# Patient Record
Sex: Female | Born: 1965 | Marital: Married | Smoking: Current every day smoker
Health system: Southern US, Community
[De-identification: ages and names within clinical notes are randomized; demographics above are authoritative.]

## PROBLEM LIST (undated history)

## (undated) DIAGNOSIS — F419 Anxiety disorder, unspecified: Secondary | ICD-10-CM

## (undated) DIAGNOSIS — J45909 Unspecified asthma, uncomplicated: Secondary | ICD-10-CM

## (undated) DIAGNOSIS — M1990 Unspecified osteoarthritis, unspecified site: Secondary | ICD-10-CM

## (undated) DIAGNOSIS — M4802 Spinal stenosis, cervical region: Secondary | ICD-10-CM

## (undated) DIAGNOSIS — D171 Benign lipomatous neoplasm of skin and subcutaneous tissue of trunk: Secondary | ICD-10-CM

## (undated) DIAGNOSIS — T7840XA Allergy, unspecified, initial encounter: Secondary | ICD-10-CM

## (undated) HISTORY — DX: Unspecified osteoarthritis, unspecified site: M19.90

## (undated) HISTORY — PX: BREAST SURGERY: SHX581

## (undated) HISTORY — DX: Allergy, unspecified, initial encounter: T78.40XA

## (undated) HISTORY — DX: Anxiety disorder, unspecified: F41.9

## (undated) HISTORY — PX: BUNIONECTOMY: SHX129

## (undated) HISTORY — PX: GANGLION CYST EXCISION: SHX1691

## (undated) HISTORY — PX: DILATION AND CURETTAGE OF UTERUS: SHX78

## (undated) HISTORY — DX: Spinal stenosis, cervical region: M48.02

---

## 2016-09-14 ENCOUNTER — Ambulatory Visit (INDEPENDENT_AMBULATORY_CARE_PROVIDER_SITE_OTHER): Payer: 59 | Admitting: Internal Medicine

## 2016-09-14 ENCOUNTER — Encounter: Payer: Self-pay | Admitting: Internal Medicine

## 2016-09-14 VITALS — BP 118/80 | HR 93 | Temp 98.2°F | Ht 59.0 in | Wt 116.0 lb

## 2016-09-14 DIAGNOSIS — J302 Other seasonal allergic rhinitis: Secondary | ICD-10-CM | POA: Insufficient documentation

## 2016-09-14 DIAGNOSIS — M4722 Other spondylosis with radiculopathy, cervical region: Secondary | ICD-10-CM

## 2016-09-14 DIAGNOSIS — J301 Allergic rhinitis due to pollen: Secondary | ICD-10-CM

## 2016-09-14 NOTE — Progress Notes (Signed)
HPI  Pt presents to the clinic today to establish care and for management of the conditions listed below. She has not had a PCP in many years.  Seasonal Allergies: Worse when it rains. She takes Bendaryl as needed with good relief  Arthritis/Spinal Stenosis of Cervical Region: She recently had a MRI of her cervical spine in [place]. She has had multiple shots with some relief. She takes Percocet and Baclofen as needed with good relief.   Flu: 08/2016 Tetanus: unsure Pap Smear: > 5 years ago Mammogram: ? 2014 in [place] Colon Screening: ? 2014 in [place] Vision Screening: annually Dentist: biannually  Past Medical History:  Diagnosis Date  . Allergy   . Arthritis   . Spinal stenosis of cervical region     Current Outpatient Prescriptions  Medication Sig Dispense Refill  . baclofen (LIORESAL) 10 MG tablet Take 10 mg by mouth 2 (two) times daily.    . diphenhydrAMINE (BENADRYL) 25 MG tablet Take 25 mg by mouth every 6 (six) hours as needed.    Marland Kitchen oxyCODONE-acetaminophen (PERCOCET) 10-325 MG tablet Take 1 tablet by mouth every 12 (twelve) hours.     No current facility-administered medications for this visit.     No Known Allergies  Family History  Problem Relation Age of Onset  . Alcohol abuse Mother   . Cervical cancer Mother   . Hodgkin's lymphoma Sister     Social History   Social History  . Marital status: Married    Spouse name: N/A  . Number of children: N/A  . Years of education: N/A   Occupational History  . Not on file.   Social History Main Topics  . Smoking status: Current Every Day Smoker    Packs/day: 0.50    Types: Cigarettes  . Smokeless tobacco: Never Used  . Alcohol use Yes     Comment: occasional  . Drug use: No  . Sexual activity: Not on file   Other Topics Concern  . Not on file   Social History Narrative  . No narrative on file    ROS:  Constitutional: Denies fever, malaise, fatigue, headache or abrupt weight changes.  HEENT: Denies eye pain,  eye redness, ear pain, ringing in the ears, wax buildup, runny nose, nasal congestion, bloody nose, or sore throat. Respiratory: Denies difficulty breathing, shortness of breath, cough or sputum production.   Cardiovascular: Denies chest pain, chest tightness, palpitations or swelling in the hands or feet.  Musculoskeletal: Pt reports neck pain. Denies decrease in range of motion, difficulty with gait, or joint swelling.  Skin: Denies redness, rashes, lesions or ulcercations.  Neurological: Denies dizziness, difficulty with memory, difficulty with speech or problems with balance and coordination.  Psych: Denies anxiety, depression, SI/HI.  No other specific complaints in a complete review of systems (except as listed in HPI above).  PE:  BP 118/80   Pulse 93   Temp 98.2 F (36.8 C) (Oral)   Ht 4\' 11"  (1.499 m)   Wt 116 lb (52.6 kg)   LMP 02/12/2011   SpO2 96%   BMI 23.43 kg/m   Wt Readings from Last 3 Encounters:  09/14/16 116 lb (52.6 kg)    General: Appears her stated age, in NAD. Cardiovascular: Normal rate and rhythm. S1,S2 noted.  No murmur, rubs or gallops noted.  Pulmonary/Chest: Normal effort and positive vesicular breath sounds. No respiratory distress. No wheezes, rales or ronchi noted.  Musculoskeletal: No difficulty with gait.  Neurological: Alert and oriented.  Psychiatric: Mood  and affect normal. Behavior is normal. Judgment and thought content normal.     Assessment and Plan:

## 2016-09-14 NOTE — Assessment & Plan Note (Signed)
Referral to pain management We will try to get records from pain management and MRI from [place] I will not be filling her Percocet long term

## 2016-09-14 NOTE — Patient Instructions (Signed)
Cervical Radiculopathy  Cervical radiculopathy means that a nerve in the neck is pinched or bruised. This can cause pain or loss of feeling (numbness) that runs from your neck to your arm and fingers.  Follow these instructions at home:  Managing pain  ? Take over-the-counter and prescription medicines only as told by your doctor.  ? If directed, put ice on the injured or painful area.  ? Put ice in a plastic bag.  ? Place a towel between your skin and the bag.  ? Leave the ice on for 20 minutes, 2?3 times per day.  ? If ice does not help, you can try using heat. Take a warm shower or warm bath, or use a heat pack as told by your doctor.  ? You may try a gentle neck and shoulder massage.  Activity  ? Rest as needed. Follow instructions from your doctor about any activities to avoid.  ? Do exercises as told by your doctor or physical therapist.  General instructions  ? If you were given a soft collar, wear it as told by your doctor.  ? Use a flat pillow when you sleep.  ? Keep all follow-up visits as told by your doctor. This is important.  Contact a doctor if:  ? Your condition does not improve with treatment.  Get help right away if:  ? Your pain gets worse and is not controlled with medicine.  ? You lose feeling or feel weak in your hand, arm, face, or leg.  ? You have a fever.  ? You have a stiff neck.  ? You cannot control when you poop or pee (have incontinence).  ? You have trouble with walking, balance, or talking.  This information is not intended to replace advice given to you by your health care provider. Make sure you discuss any questions you have with your health care provider.  Document Released: 12/15/2010 Document Revised: 06/03/2015 Document Reviewed: 02/19/2014  [employer]vier Interactive Patient Education ? 2018 [employer]

## 2016-09-14 NOTE — Assessment & Plan Note (Signed)
Continue Benadryl prn

## 2016-09-20 ENCOUNTER — Other Ambulatory Visit: Payer: Self-pay

## 2016-09-20 NOTE — Telephone Encounter (Signed)
Pt left v/m requesting rx oxycodone apap. Call when ready for pick up. Pt seen 09/14/16 and in note has R Baity NP will not be filling oxycodone long term.Please advise.

## 2016-09-21 ENCOUNTER — Encounter: Payer: Self-pay | Admitting: Internal Medicine

## 2016-09-21 MED ORDER — OXYCODONE-ACETAMINOPHEN 10-325 MG PO TABS
1.0000 | ORAL_TABLET | Freq: Two times a day (BID) | ORAL | 0 refills | Status: DC
Start: 2016-09-21 — End: 2016-12-07

## 2016-09-21 NOTE — Telephone Encounter (Signed)
We just received her previous Pain records and I have given you a copy and I have faxed a copy to [facility]. They have to review her records and then if accepted they will call her directly. We send the Referral electronically to them. You can pull her up in Epic and see if an appointment has been set up for her in the future but it will probably take at least a couple of weeks.

## 2016-09-21 NOTE — Telephone Encounter (Signed)
Pt is aware as instructed and Rx left in front office for pick up

## 2016-09-21 NOTE — Telephone Encounter (Signed)
When is her pain management appt?

## 2016-09-21 NOTE — Telephone Encounter (Signed)
RX printed and signed and placed in MYD box. She needs to let me know as soon as she has an appt with pain management.

## 2016-10-12 ENCOUNTER — Ambulatory Visit
Payer: 59 | Attending: Student in an Organized Health Care Education/Training Program | Admitting: Student in an Organized Health Care Education/Training Program

## 2016-10-17 ENCOUNTER — Telehealth: Payer: Self-pay | Admitting: Internal Medicine

## 2016-10-17 ENCOUNTER — Encounter: Payer: Self-pay | Admitting: Student in an Organized Health Care Education/Training Program

## 2016-10-17 ENCOUNTER — Ambulatory Visit
Payer: 59 | Attending: Student in an Organized Health Care Education/Training Program | Admitting: Student in an Organized Health Care Education/Training Program

## 2016-10-17 VITALS — BP 150/82 | HR 75 | Temp 98.3°F | Resp 16 | Ht 59.0 in | Wt 116.0 lb

## 2016-10-17 DIAGNOSIS — S139XXD Sprain of joints and ligaments of unspecified parts of neck, subsequent encounter: Secondary | ICD-10-CM

## 2016-10-17 DIAGNOSIS — G894 Chronic pain syndrome: Secondary | ICD-10-CM | POA: Diagnosis not present

## 2016-10-17 DIAGNOSIS — F17210 Nicotine dependence, cigarettes, uncomplicated: Secondary | ICD-10-CM | POA: Diagnosis not present

## 2016-10-17 DIAGNOSIS — M47812 Spondylosis without myelopathy or radiculopathy, cervical region: Secondary | ICD-10-CM | POA: Diagnosis not present

## 2016-10-17 DIAGNOSIS — M4722 Other spondylosis with radiculopathy, cervical region: Secondary | ICD-10-CM

## 2016-10-17 DIAGNOSIS — X58XXXA Exposure to other specified factors, initial encounter: Secondary | ICD-10-CM | POA: Diagnosis not present

## 2016-10-17 DIAGNOSIS — G5603 Carpal tunnel syndrome, bilateral upper limbs: Secondary | ICD-10-CM | POA: Insufficient documentation

## 2016-10-17 DIAGNOSIS — M542 Cervicalgia: Secondary | ICD-10-CM | POA: Diagnosis not present

## 2016-10-17 DIAGNOSIS — S139XXA Sprain of joints and ligaments of unspecified parts of neck, initial encounter: Secondary | ICD-10-CM | POA: Diagnosis not present

## 2016-10-17 MED ORDER — DULOXETINE HCL 30 MG PO CPEP
30.0000 mg | ORAL_CAPSULE | Freq: Every day | ORAL | 1 refills | Status: DC
Start: 2016-10-17 — End: 2016-12-07

## 2016-10-17 MED ORDER — LIDOCAINE 5 % EX OINT: 1.0000 "application " | TOPICAL_OINTMENT | Freq: Four times a day (QID) | CUTANEOUS | 2 refills | Status: DC | PRN

## 2016-10-17 MED ORDER — NONFORMULARY OR COMPOUNDED ITEM: 2 refills | Status: AC

## 2016-10-17 NOTE — Telephone Encounter (Signed)
Once they see pain management, I do not refill medications, it is a breach of their pain med contract. She will need to do what pain management doctor requests.

## 2016-10-17 NOTE — Progress Notes (Signed)
Patient's Name: Tammy Lang  MRN: [id number]  Referring Provider: Baity, Regina W, NP  DOB: 06/13/1965  PCP: Baity, Regina W, NP  DOS: 10/17/2016  Note by:  , MD  Service setting: Ambulatory outpatient  Specialty: Interventional Pain Management  Location: [facility] (AMB) Pain Management Facility  Visit type: Initial Patient Evaluation  Patient type: New Patient   Primary Reason(s) for Visit: Encounter for initial evaluation of one or more chronic problems (new to examiner) potentially causing chronic pain, and posing a threat to normal musculoskeletal function. (Level of risk: High) CC: Neck Pain  HPI  Ms. Males is a 51 y.o. year old, female patient, who comes today to see us for the first time for an initial evaluation of her chronic pain. She has Osteoarthritis of spine with radiculopathy, cervical region; Seasonal allergies; Spondylosis of cervical region without myelopathy or radiculopathy; Neck pain; Chronic pain syndrome; Neck sprain; and Bilateral carpal tunnel syndrome on her problem list. Today she comes in for evaluation of her Neck Pain  Pain Assessment: Location: Left Neck Radiating: left upper arm, with tingling Onset: More than a month ago Duration: Chronic pain Quality: Sore Severity: 8 /10 (self-reported pain score)  Note: Reported level is compatible with observation.                   When using our objective Pain Scale, levels between 6 and 10/10 are said to belong in an emergency room, as it progressively worsens from a 6/10, described as severely limiting, requiring emergency care not usually available at an outpatient pain management facility. At a 6/10 level, communication becomes difficult and requires great effort. Assistance to reach the emergency department may be required. Facial flushing and profuse sweating along with potentially dangerous increases in heart rate and blood pressure will be evident. Effect on ADL:   Timing: Constant Modifying factors:  Voltaren gel, Lidocaine patch  Onset and Duration: Present longer than 3 months Cause of pain: not stated on questionnaire Severity: No change since onset, NAS-11 at its worse: 10/10, NAS-11 at its best: 5/10, NAS-11 now: 8/10 and NAS-11 on the average: 6/10 Timing: Night Aggravating Factors: Prolonged sitting and Prolonged standing Alleviating Factors: Medications, TENS and Warm showers or baths Associated Problems: Spasms, Tingling and Pain that does not allow patient to sleep Quality of Pain: Sharp, Shooting and Tingling Previous Examinations or Tests: The patient denies any previous exams or tests Previous Treatments: Narcotic medications and TENS  The patient comes into the clinics today for the first time for a chronic pain management evaluation.   51-year-old female who presents with chronic neck, bilateral shoulder, periscapular pain that occasionally extends to bilateral elbows. This has been present for many years. Patient recently moved from [place]. Denies any traumatic or inciting event. Endorses stabbing pain with spasms. Patient has done physical therapy in the past. She is on Percocet 10 mg twice a day when necessary. She was previously taking it a times a day in the past. She finds this helpful for her neck pain. Patient also takes baclofen 10 mg twice a day when necessary, applies Voltaren gel along with lidocaine patches to her neck region. She finds these helpful. Patient had a cervical epidural steroid injection done in [place] in early August which she found moderately helpful. No weakness appreciated of bilateral upper extremities.  Patient also has a history of bilateral carpal tunnel syndrome for which she uses bilateral wrist splints.  Today I took the time to provide the patient with information   regarding my pain practice. The patient was informed that my practice is divided into two sections: an interventional pain management section, as well as a completely  separate and distinct medication management section. I explained that I have procedure days for my interventional therapies, and evaluation days for follow-ups and medication management. Because of the amount of documentation required during both, they are kept separated. This means that there is the possibility that she may be scheduled for a procedure on one day, and medication management the next. I have also informed her that because of staffing and facility limitations, I no longer take patients for medication management only. To illustrate the reasons for this, I gave the patient the example of surgeons, and how inappropriate it would be to refer a patient to his/her care, just to write for the post-surgical antibiotics on a surgery done by a different surgeon.   Because interventional pain management is my board-certified specialty, the patient was informed that joining my practice means that they are open to any and all interventional therapies. I made it clear that this does not mean that they will be forced to have any procedures done. What this means is that I believe interventional therapies to be essential part of the diagnosis and proper management of chronic pain conditions. Therefore, patients not interested in these interventional alternatives will be better served under the care of a different practitioner.  The patient was also made aware of my Comprehensive Pain Management Safety Guidelines where by joining my practice, they limit all of their nerve blocks and joint injections to those done by our practice, for as long as we are retained to manage their care.   Historic Controlled Substance Pharmacotherapy Review  PMP and historical list of controlled substances: Percocet 10 mg, quantity 30, last fill 09/21/2016.taking 10 mg twice a day when necessary Highest opioid analgesic regimen found: Percocet 10 mg 3 times a day when necessary MME/day: approximately 30 mg/day (Percocet 10 mg twice  a day when necessary) Medications: The patient did not bring the medication(s) to the appointment, as requested in our "New Patient Package" Pharmacodynamics: Desired effects: Analgesia: The patient reports >50% benefit. Reported improvement in function: The patient reports medication allows her to accomplish basic ADLs. Clinically meaningful improvement in function (CMIF): Sustained CMIF goals met Perceived effectiveness: Described as relatively effective, allowing for increase in activities of daily living (ADL) Undesirable effects: Side-effects or Adverse reactions: None reported Historical Monitoring: The patient  reports that she does not use drugs. List of all UDS Test(s): No results found for: MDMA, COCAINSCRNUR, PCPSCRNUR, PCPQUANT, CANNABQUANT, THCU, [place] List of all Serum Drug Screening Test(s):  No results found for: AMPHSCRSER, BARBSCRSER, BENZOSCRSER, COCAINSCRSER, PCPSCRSER, PCPQUANT, THCSCRSER, CANNABQUANT, OPIATESCRSER, OXYSCRSER, PROPOXSCRSER Historical Background Evaluation: [place] PMP: Six (6) year initial data search conducted.             PMP NARX Score Report:  Narcotic: 220 Sedative: 100 Stimulant: 0 [place] Department of public safety, offender search: Editor, commissioning Information) Non-contributory Risk Assessment Profile: Aberrant behavior: None observed or detected today Risk factors for fatal opioid overdose: None identified today PMP NARX Overdose Risk Score: 200 Fatal overdose hazard ratio (HR): Calculation deferred Non-fatal overdose hazard ratio (HR): Calculation deferred Risk of opioid abuse or dependence: 0.7-3.0% with doses ? 36 MME/day and 6.1-26% with doses ? 120 MME/day. Substance use disorder (SUD) risk level: Pending results of Medical Psychology Evaluation for SUD Opioid risk tool (ORT) (Total Score): 7     Opioid Risk Tool -  10/17/16 1110      Family History of Substance Abuse   Alcohol Positive Female   Illegal Drugs Positive Female   Rx Drugs Positive  Female or Female     Personal History of Substance Abuse   Alcohol Negative   Illegal Drugs Negative   Rx Drugs Negative     Age   Age between 15-45 years  No     History of Preadolescent Sexual Abuse   History of Preadolescent Sexual Abuse Negative or Female     Psychological Disease   Psychological Disease Negative   Depression Negative     Total Score   Opioid Risk Tool Scoring 7   Opioid Risk Interpretation Moderate Risk     ORT Scoring interpretation table:  Score <3 = Low Risk for SUD  Score between 4-7 = Moderate Risk for SUD  Score >8 = High Risk for Opioid Abuse    Pharmacologic Plan: Pending ordered tests and/or consults            Initial impression: Pending review of available data and ordered tests.  Meds   Current Outpatient Prescriptions:  .  baclofen (LIORESAL) 10 MG tablet, Take 10 mg by mouth 2 (two) times daily., Disp: , Rfl:  .  diclofenac sodium (VOLTAREN) 1 % GEL, Apply 2 g topically 4 (four) times daily., Disp: , Rfl:  .  diphenhydrAMINE (BENADRYL) 25 MG tablet, Take 25 mg by mouth every 6 (six) hours as needed., Disp: , Rfl:  .  oxyCODONE-acetaminophen (PERCOCET) 10-325 MG tablet, Take 1 tablet by mouth every 12 (twelve) hours., Disp: 30 tablet, Rfl: 0 .  DULoxetine (CYMBALTA) 30 MG capsule, Take 1 capsule (30 mg total) by mouth at bedtime., Disp: 30 capsule, Rfl: 1 .  lidocaine (XYLOCAINE) 5 % ointment, Apply 1 application topically 4 (four) times daily as needed for moderate pain., Disp: 35.44 g, Rfl: 2 .  NONFORMULARY OR COMPOUNDED ITEM, 10% Ketamine/2% Cyclobenzaprine/6% Gabapentin Cream Sig: 1-2 ml to affected area 3-4 times/day. Dispense: 240 GM bottle, Disp: 1 each, Rfl: 2  Imaging Review    Cervical spine imaging note from outside pain clinic: Moderate to severe canal stenosis at C4-C5 and C6-C7. Multilevel degenerative cervical disease most pronounced at C4, C5, C6, C7. Facet arthropathy also present. Complexity Note: Imaging results  reviewed. Results shared with Ms. Paull, using Layman's terms.                         ROS  Cardiovascular History: No reported cardiovascular signs or symptoms such as High blood pressure, coronary artery disease, abnormal heart rate or rhythm, heart attack, blood thinner therapy or heart weakness and/or failure Pulmonary or Respiratory History: Wheezing and difficulty taking a deep full breath (Asthma) and Smoking Neurological History: No reported neurological signs or symptoms such as seizures, abnormal skin sensations, urinary and/or fecal incontinence, being born with an abnormal open spine and/or a tethered spinal cord Review of Past Neurological Studies: No results found for this or any previous visit. Psychological-Psychiatric History: Difficulty sleeping and or falling asleep Gastrointestinal History: Irregular, infrequent bowel movements (Constipation) Genitourinary History: Difficulty emptying the bladder or controlling the flow of urine (Neurogenic bladder) Hematological History: No reported hematological signs or symptoms such as prolonged bleeding, low or poor functioning platelets, bruising or bleeding easily, hereditary bleeding problems, low energy levels due to low hemoglobin or being anemic Endocrine History: No reported endocrine signs or symptoms such as high or low blood sugar, rapid  heart rate due to high thyroid levels, obesity or weight gain due to slow thyroid or thyroid disease Rheumatologic History: No reported rheumatological signs and symptoms such as fatigue, joint pain, tenderness, swelling, redness, heat, stiffness, decreased range of motion, with or without associated rash Musculoskeletal History: Negative for myasthenia gravis, muscular dystrophy, multiple sclerosis or malignant hyperthermia Work History: Working full time  Allergies  Ms. Granlund has No Known Allergies.  Laboratory Chemistry  Inflammation Markers (CRP: Acute Phase) (ESR: Chronic Phase) No  results found for: CRP, ESRSEDRATE               Renal Function Markers No results found for: BUN, CREATININE, GFRAA, GFRNONAA               Hepatic Function Markers No results found for: AST, ALT, ALBUMIN, ALKPHOS, HCVAB               Electrolytes No results found for: NA, K, CL, CALCIUM, MG               Neuropathy Markers No results found for: VITAMINB12               Bone Pathology Markers No results found for: ALKPHOS, VD25OH, VD125OH2TOT, VD3125OH2, VD2125OH2, 25OHVITD1, 25OHVITD2, 25OHVITD3, CALCIUM, TESTOFREE, TESTOSTERONE               Coagulation Parameters No results found for: INR, LABPROT, APTT, PLT               Cardiovascular Markers No results found for: BNP, HGB, HCT               Note: Lab results reviewed.  PFSH  Drug: Ms. Beumer  reports that she does not use drugs. Alcohol:  reports that she drinks alcohol. Tobacco:  reports that she has been smoking Cigarettes.  She has a 15.00 pack-year smoking history. She has never used smokeless tobacco. Medical:  has a past medical history of Allergy; Arthritis; and Spinal stenosis of cervical region. Family: family history includes Alcohol abuse in her mother; Cervical cancer in her mother; Hodgkin's lymphoma in her sister.  Past Surgical History:  Procedure Laterality Date  . BUNIONECTOMY    . CESAREAN SECTION    . GANGLION CYST EXCISION     Active Ambulatory Problems    Diagnosis Date Noted  . Osteoarthritis of spine with radiculopathy, cervical region 09/14/2016  . Seasonal allergies 09/14/2016  . Spondylosis of cervical region without myelopathy or radiculopathy 10/17/2016  . Neck pain 10/17/2016  . Chronic pain syndrome 10/17/2016  . Neck sprain 10/17/2016  . Bilateral carpal tunnel syndrome 10/17/2016   Resolved Ambulatory Problems    Diagnosis Date Noted  . No Resolved Ambulatory Problems   Past Medical History:  Diagnosis Date  . Allergy   . Arthritis   . Spinal stenosis of cervical region     Constitutional Exam  General appearance: Well nourished, well developed, and well hydrated. In no apparent acute distress Vitals:   10/17/16 1103  BP: (!) 150/82  Pulse: 75  Resp: 16  Temp: 98.3 F (36.8 C)  TempSrc: Oral  SpO2: 100%  Weight: 116 lb (52.6 kg)  Height: 4' 11" (1.499 m)   BMI Assessment: Estimated body mass index is 23.43 kg/m as calculated from the following:   Height as of this encounter: 4' 11" (1.499 m).   Weight as of this encounter: 116 lb (52.6 kg).  BMI interpretation table: BMI level Category Range association with higher incidence of   chronic pain  <18 kg/m2 Underweight   18.5-24.9 kg/m2 Ideal body weight   25-29.9 kg/m2 Overweight Increased incidence by 20%  30-34.9 kg/m2 Obese (Class I) Increased incidence by 68%  35-39.9 kg/m2 Severe obesity (Class II) Increased incidence by 136%  >40 kg/m2 Extreme obesity (Class III) Increased incidence by 254%   BMI Readings from Last 4 Encounters:  10/17/16 23.43 kg/m  09/14/16 23.43 kg/m   Wt Readings from Last 4 Encounters:  10/17/16 116 lb (52.6 kg)  09/14/16 116 lb (52.6 kg)  Psych/Mental status: Alert, oriented x 3 (person, place, & time)       Eyes: PERLA Respiratory: No evidence of acute respiratory distress  Cervical Spine Area Exam  Skin & Axial Inspection: No masses, redness, edema, swelling, or associated skin lesions Alignment: Symmetrical Functional ROM: Unrestricted ROM      Stability: No instability detected Muscle Tone/Strength: Functionally intact. No obvious neuro-muscular anomalies detected. Sensory (Neurological): Movement-associated pain Palpation: Complains of area being tender to palpationoverlying cervical facets Positive provocative maneuver for for cervical facet disease Pain with cervical extension and lateral rotation Upper Extremity (UE) Exam    Side: Right upper extremity  Side: Left upper extremity  Skin & Extremity Inspection: Skin color, temperature, and hair growth  are WNL. No peripheral edema or cyanosis. No masses, redness, swelling, asymmetry, or associated skin lesions. No contractures.  Skin & Extremity Inspection: Skin color, temperature, and hair growth are WNL. No peripheral edema or cyanosis. No masses, redness, swelling, asymmetry, or associated skin lesions. No contractures.  Functional ROM: Unrestricted ROM          Functional ROM: Unrestricted ROM          Muscle Tone/Strength: Functionally intact. No obvious neuro-muscular anomalies detected.  Muscle Tone/Strength: Functionally intact. No obvious neuro-muscular anomalies detected.  Sensory (Neurological): Unimpaired          Sensory (Neurological): Unimpaired          Palpation: Tender              Palpation: Tender              Specialized Test(s): Deferred         Specialized Test(s): Deferred         5 out of 5 strength bilateral upper extremity: Shoulder abduction C5, elbow flexion C6, elbow extension C7, thumb extension C8. Thoracic Spine Area Exam  Skin & Axial Inspection: No masses, redness, or swelling Alignment: Symmetrical Functional ROM: Unrestricted ROM Stability: No instability detected Muscle Tone/Strength: Functionally intact. No obvious neuro-muscular anomalies detected. Sensory (Neurological): Unimpaired Muscle strength & Tone: No palpable anomalies  Lumbar Spine Area Exam  Skin & Axial Inspection: No masses, redness, or swelling Alignment: Symmetrical Functional ROM: Unrestricted ROM      Stability: No instability detected Muscle Tone/Strength: Functionally intact. No obvious neuro-muscular anomalies detected. Sensory (Neurological): Unimpaired Palpation: No palpable anomalies       Provocative Tests: Lumbar Hyperextension and rotation test: evaluation deferred today       Lumbar Lateral bending test: evaluation deferred today       Patrick's Maneuver: evaluation deferred today                    Gait & Posture Assessment  Ambulation: Unassisted Gait: Relatively  normal for age and body habitus Posture: WNL   Lower Extremity Exam    Side: Right lower extremity  Side: Left lower extremity  Skin & Extremity Inspection: Skin color, temperature, and hair growth   are WNL. No peripheral edema or cyanosis. No masses, redness, swelling, asymmetry, or associated skin lesions. No contractures.  Skin & Extremity Inspection: Skin color, temperature, and hair growth are WNL. No peripheral edema or cyanosis. No masses, redness, swelling, asymmetry, or associated skin lesions. No contractures.  Functional ROM: Unrestricted ROM          Functional ROM: Unrestricted ROM          Muscle Tone/Strength: Functionally intact. No obvious neuro-muscular anomalies detected.  Muscle Tone/Strength: Functionally intact. No obvious neuro-muscular anomalies detected.  Sensory (Neurological): Unimpaired  Sensory (Neurological): Unimpaired  Palpation: No palpable anomalies  Palpation: No palpable anomalies   Assessment  Primary Diagnosis & Pertinent Problem List: The primary encounter diagnosis was Spondylosis of cervical region without myelopathy or radiculopathy. Diagnoses of Osteoarthritis of spine with radiculopathy, cervical region, Neck pain, Cervicalgia, Chronic pain syndrome, Neck sprain, subsequent encounter, and Bilateral carpal tunnel syndrome were also pertinent to this visit.  Visit Diagnosis (New problems to examiner): 1. Spondylosis of cervical region without myelopathy or radiculopathy   2. Osteoarthritis of spine with radiculopathy, cervical region   3. Neck pain   4. Cervicalgia   5. Chronic pain syndrome   6. Neck sprain, subsequent encounter   7. Bilateral carpal tunnel syndrome    General Recommendations: The pain condition that the patient suffers from is best treated with a multidisciplinary approach that involves an increase in physical activity to prevent de-conditioning and worsening of the pain cycle, as well as psychological counseling (formal and/or  informal) to address the co-morbid psychological affects of pain. Treatment will often involve judicious use of pain medications and interventional procedures to decrease the pain, allowing the patient to participate in the physical activity that will ultimately produce long-lasting pain reductions. The goal of the multidisciplinary approach is to return the patient to a higher level of overall function and to restore their ability to perform activities of daily living.  51-year-old female who presents with neck pain that radiates into bilateral shoulders, peri scapular region, with occasional radiation down mid arm. This is secondary to cervical radiculopathy, cervical degenerative disc disease, cervical spondylosis. Patient was seeing a pain provider in [place] where she recently moved from. She had completed to cervical epidural steroid injections at C6-C7 which provided her with moderate pain relief. Her last injection was approximately 1 month ago.  Patient is interested in continuing opioid therapy. She is on Percocet 10 g twice a day when necessary which she finds effective. Today we will complete a urine drug screen which I expect to be positive for oxycodone and its metabolites. We will also provide her with a referral to pain psychology for a one-time visit for SUD evaluation. Otherwise patient can continue her baclofen 10 mg twice a day to 3 times a day when necessary, lidocaine 5% patch  To assist with the neuropathic pain, I'll prescribe her Cymbalta 30 mg daily at bedtime. I will also prescribe her compounded cream that she can apply to her neck region since she finds Voltaren gel helpful but is requesting a stronger cream. Also on exam patient does have pain overlying her cervical facets and a pain referral pattern over her shoulder and periscapular region which is consistent with cervical facet syndrome. We discussed performing bilateral C4, C5, C6, C7 facet blocks under sedation with goal  radiofrequency ablation if there are effective. Risks and benefits were discussed. Patient is in agreement with plan.  Plan: -Urine drug screen today, should be positive for   oxycodone and its metabolites -Referral to pain psychology which is standard for new patients for substance abuse disorder evaluation prior to starting opioid therapy. -Continue baclofen 10 mg as prescribed -New prescription for Cymbalta 30 mg daily at bedtime for neuropathic pain. -compounded cream that the patient can apply to her neck region. -Schedule for bilateral C4, C5, C6, C7 facet blocks under sedation with goal radiofrequency ablation   Ordered Lab-work, Procedure(s), Referral(s), & Consult(s): Orders Placed This Encounter  Procedures  . CERVICAL FACET (MEDIAL BRANCH NERVE BLOCK)   . Compliance Drug Analysis, Ur  . Ambulatory referral to Psychology   Pharmacotherapy (current): Medications ordered:  Meds ordered this encounter  Medications  . DULoxetine (CYMBALTA) 30 MG capsule    Sig: Take 1 capsule (30 mg total) by mouth at bedtime.    Dispense:  30 capsule    Refill:  1  . NONFORMULARY OR COMPOUNDED ITEM    Sig: 10% Ketamine/2% Cyclobenzaprine/6% Gabapentin Cream Sig: 1-2 ml to affected area 3-4 times/day. Dispense: 240 GM bottle    Dispense:  1 each    Refill:  2    Do not place this medication, or any other prescription from our practice, on "Automatic Refill". Patient may have prescription filled one day early if pharmacy is closed on scheduled refill date.  . lidocaine (XYLOCAINE) 5 % ointment    Sig: Apply 1 application topically 4 (four) times daily as needed for moderate pain.    Dispense:  35.44 g    Refill:  2    Maximum dose: 5 g/application (approximately 6 inches of ointment); 20 g/day   Medications administered during this visit: Ms. Meadors had no medications administered during this visit.   Pharmacological management options:  Opioid Analgesics: The patient was informed that  there is no guarantee that she would be a candidate for opioid analgesics. The decision will be made following CDC guidelines. This decision will be based on the results of diagnostic studies, as well as Ms. Pinnix's risk profile.   Membrane stabilizer: as tried gabapentin in the past, did not find it effective. We'll trial Cymbalta. Can consider Lyrica in the future.  Muscle relaxant: on baclofen. Can consider tizanidine in the future.  NSAID: To be determined at a later time  Other analgesic(s): To be determined at a later time   Interventional management options: Ms. Hackleman was informed that there is no guarantee that she would be a candidate for interventional therapies. The decision will be based on the results of diagnostic studies, as well as Ms. Sheperd's risk profile.  Procedure(s) under consideration:  -status post to C6/C7 cervical epidural steroid injections in [place] which provided moderate pain relief, can consider repeating. Previous one was August 2018.  -Bilateral cervical medial branch nerve blocks with goal radio frequency ablation: C4, C5, C6, C7   Provider-requested follow-up: Return for Procedure.  No future appointments.  Primary Care Physician: Jearld Fenton, NP Location: [facility] Note by: Gillis Santa, M.D, Date: 10/17/2016; Time: 3:12 PM  Patient Instructions  1. Continue baclofen and Percocet as currently prescribed. 2. Urine drug screen today. 3. Continue lidocaine 5% as prescribed. Prescription sent to pharmacy. 4. Compounded ketamine cream that you can obtain a compounding pharmacy. 5. Referral to pain psychology which is standard for new patients. 6. Schedule for bilateral cervical facet blocks with sedation. Facet Blocks Patient Information  Description: The facets are joints in the spine between the vertebrae.  Like any joints in the body, facets  can become irritated and painful.  Arthritis can also effect the  facets.  By injecting steroids and local anesthetic in and around these joints, we can temporarily block the nerve supply to them.  Steroids act directly on irritated nerves and tissues to reduce selling and inflammation which often leads to decreased pain.  Facet blocks may be done anywhere along the spine from the neck to the low back depending upon the location of your pain.   After numbing the skin with local anesthetic (like Novocaine), a small needle is passed onto the facet joints under x-ray guidance.  You may experience a sensation of pressure while this is being done.  The entire block usually lasts about 15-25 minutes.   Conditions which may be treated by facet blocks:   Low back/buttock pain  Neck/shoulder pain  Certain types of headaches  Preparation for the injection:  1. Do not eat any solid food or dairy products within 8 hours of your appointment. 2. You may drink clear liquid up to 3 hours before appointment.  Clear liquids include water, black coffee, juice or soda.  No milk or cream please. 3. You may take your regular medication, including pain medications, with a sip of water before your appointment.  Diabetics should hold regular insulin (if taken separately) and take 1/2 normal NPH dose the morning of the procedure.  Carry some sugar containing items with you to your appointment. 4. A driver must accompany you and be prepared to drive you home after your procedure. 5. Bring all your current medications with you. 6. An IV may be inserted and sedation may be given at the discretion of the physician. 7. A blood pressure cuff, EKG and other monitors will often be applied during the procedure.  Some patients may need to have extra oxygen administered for a short period. 8. You will be asked to provide medical information, including your allergies and medications, prior to the procedure.  We must know immediately if you are taking blood thinners (like Coumadin/Warfarin) or if  you are allergic to IV iodine contrast (dye).  We must know if you could possible be pregnant.  Possible side-effects:   Bleeding from needle site  Infection (rare, may require surgery)  Nerve injury (rare)  Numbness & tingling (temporary)  Difficulty urinating (rare, temporary)  Spinal headache (a headache worse with upright posture)  Light-headedness (temporary)  Pain at injection site (serveral days)  Decreased blood pressure (rare, temporary)  Weakness in arm/leg (temporary)  Pressure sensation in back/neck (temporary)   Call if you experience:   Fever/chills associated with headache or increased back/neck pain  Headache worsened by an upright position  New onset, weakness or numbness of an extremity below the injection site  Hives or difficulty breathing (go to the emergency room)  Inflammation or drainage at the injection site(s)  Severe back/neck pain greater than usual  New symptoms which are concerning to you  Please note:  Although the local anesthetic injected can often make your back or neck feel good for several hours after the injection, the pain will likely return. It takes 3-7 days for steroids to work.  You may not notice any pain relief for at least one week.  If effective, we will often do a series of 2-3 injections spaced 3-6 weeks apart to maximally decrease your pain.  After the initial series, you may be a candidate for a more permanent nerve block of the facets.  If you have any questions, please call #  [phone] [facility]

## 2016-10-17 NOTE — Telephone Encounter (Signed)
Lm on pts vm requesting a call back 

## 2016-10-17 NOTE — Patient Instructions (Addendum)
1. Continue baclofen and Percocet as currently prescribed. 2. Urine drug screen today. 3. Continue lidocaine 5% as prescribed. Prescription sent to pharmacy. 4. Compounded ketamine cream that you can obtain a compounding pharmacy. 5. Referral to pain psychology which is standard for new patients. 6. Schedule for bilateral cervical facet blocks with sedation. Facet Blocks Patient Information  Description: The facets are joints in the spine between the vertebrae.  Like any joints in the body, facets can become irritated and painful.  Arthritis can also effect the facets.  By injecting steroids and local anesthetic in and around these joints, we can temporarily block the nerve supply to them.  Steroids act directly on irritated nerves and tissues to reduce selling and inflammation which often leads to decreased pain.  Facet blocks may be done anywhere along the spine from the neck to the low back depending upon the location of your pain.   After numbing the skin with local anesthetic (like Novocaine), a small needle is passed onto the facet joints under x-ray guidance.  You may experience a sensation of pressure while this is being done.  The entire block usually lasts about 15-25 minutes.   Conditions which may be treated by facet blocks:   Low back/buttock pain  Neck/shoulder pain  Certain types of headaches  Preparation for the injection:  1. Do not eat any solid food or dairy products within 8 hours of your appointment. 2. You may drink clear liquid up to 3 hours before appointment.  Clear liquids include water, black coffee, juice or soda.  No milk or cream please. 3. You may take your regular medication, including pain medications, with a sip of water before your appointment.  Diabetics should hold regular insulin (if taken separately) and take 1/2 normal NPH dose the morning of the procedure.  Carry some sugar containing items with you to your appointment. 4. A driver must accompany you  and be prepared to drive you home after your procedure. 5. Bring all your current medications with you. 6. An IV may be inserted and sedation may be given at the discretion of the physician. 7. A blood pressure cuff, EKG and other monitors will often be applied during the procedure.  Some patients may need to have extra oxygen administered for a short period. 8. You will be asked to provide medical information, including your allergies and medications, prior to the procedure.  We must know immediately if you are taking blood thinners (like Coumadin/Warfarin) or if you are allergic to IV iodine contrast (dye).  We must know if you could possible be pregnant.  Possible side-effects:   Bleeding from needle site  Infection (rare, may require surgery)  Nerve injury (rare)  Numbness & tingling (temporary)  Difficulty urinating (rare, temporary)  Spinal headache (a headache worse with upright posture)  Light-headedness (temporary)  Pain at injection site (serveral days)  Decreased blood pressure (rare, temporary)  Weakness in arm/leg (temporary)  Pressure sensation in back/neck (temporary)   Call if you experience:   Fever/chills associated with headache or increased back/neck pain  Headache worsened by an upright position  New onset, weakness or numbness of an extremity below the injection site  Hives or difficulty breathing (go to the emergency room)  Inflammation or drainage at the injection site(s)  Severe back/neck pain greater than usual  New symptoms which are concerning to you  Please note:  Although the local anesthetic injected can often make your back or neck feel good for several hours  after the injection, the pain will likely return. It takes 3-7 days for steroids to work.  You may not notice any pain relief for at least one week.  If effective, we will often do a series of 2-3 injections spaced 3-6 weeks apart to maximally decrease your pain.  After the  initial series, you may be a candidate for a more permanent nerve block of the facets.  If you have any questions, please call #[phone] [facility]

## 2016-10-17 NOTE — Progress Notes (Signed)
Safety precautions to be maintained throughout the outpatient stay will include: orient to surroundings, keep bed in low position, maintain call bell within reach at all times, provide assistance with transfer out of bed and ambulation.  

## 2016-10-17 NOTE — Telephone Encounter (Signed)
Patient left voicemail on my phone that she saw the Pain Dr today and he didn't give her any pain medicine until she goes to a class? She would like refill on her pain medicine. Please call the patient at [phone]

## 2016-10-18 ENCOUNTER — Telehealth: Payer: Self-pay | Admitting: *Deleted

## 2016-10-18 NOTE — Telephone Encounter (Signed)
Left message for patinet to call back

## 2016-10-18 NOTE — Telephone Encounter (Signed)
Lm on pts vm requesting a call back 

## 2016-10-23 LAB — COMPLIANCE DRUG ANALYSIS, UR

## 2016-10-24 NOTE — Telephone Encounter (Signed)
Patient has not returned our call. Will file this message.

## 2016-11-01 ENCOUNTER — Ambulatory Visit (INDEPENDENT_AMBULATORY_CARE_PROVIDER_SITE_OTHER): Payer: 59 | Admitting: Internal Medicine

## 2016-11-01 ENCOUNTER — Encounter: Payer: Self-pay | Admitting: Internal Medicine

## 2016-11-01 VITALS — BP 138/90 | HR 91 | Temp 98.2°F | Wt 116.0 lb

## 2016-11-01 DIAGNOSIS — G8929 Other chronic pain: Secondary | ICD-10-CM | POA: Diagnosis not present

## 2016-11-01 DIAGNOSIS — M542 Cervicalgia: Secondary | ICD-10-CM | POA: Diagnosis not present

## 2016-11-01 MED ORDER — KETOROLAC TROMETHAMINE 30 MG/ML IJ SOLN
30.0000 mg | Freq: Once | INTRAMUSCULAR | Status: AC
  Administered 2016-11-01: 30 mg via INTRAMUSCULAR

## 2016-11-01 MED ORDER — ETODOLAC 200 MG PO CAPS: 200.0000 mg | ORAL_CAPSULE | Freq: Three times a day (TID) | ORAL | 0 refills | Status: DC

## 2016-11-01 MED ORDER — DICLOFENAC SODIUM 1 % TD GEL: 2.0000 g | Freq: Four times a day (QID) | TRANSDERMAL | 2 refills | Status: DC

## 2016-11-01 NOTE — Progress Notes (Signed)
Subjective:    Patient ID: Tammy Lang, female    DOB: 01/07/1966, 51 y.o.   MRN: [id number]  HPI  Pt presents to the clinic today requesting refills of her Percocet and Baclofen. She has a history of chronic neck pain. She established care with me 09/14/16 and was referred to pain management at that time. She saw Dr. Holley Raring 10/9. He wanted to schedule her for cervical facet injections. He wanted her to see a pain psychologist before he prescribed her Percocet. He advised her to continue Baclofen. He started her on Cymbalata and gave her a pain relief get to rub on her neck. She was very unhappy that he would not provide her with her Percocet during that visit. She would like a refill of her Voltaren gel and whatever else I can give her for pain today.   Review of Systems      Past Medical History:  Diagnosis Date  . Allergy   . Arthritis   . Spinal stenosis of cervical region     Current Outpatient Prescriptions  Medication Sig Dispense Refill  . baclofen (LIORESAL) 10 MG tablet Take 10 mg by mouth 2 (two) times daily.    . diclofenac sodium (VOLTAREN) 1 % GEL Apply 2 g topically 4 (four) times daily.    . diphenhydrAMINE (BENADRYL) 25 MG tablet Take 25 mg by mouth every 6 (six) hours as needed.    . DULoxetine (CYMBALTA) 30 MG capsule Take 1 capsule (30 mg total) by mouth at bedtime. 30 capsule 1  . lidocaine (XYLOCAINE) 5 % ointment Apply 1 application topically 4 (four) times daily as needed for moderate pain. 35.44 g 2  . NONFORMULARY OR COMPOUNDED ITEM 10% Ketamine/2% Cyclobenzaprine/6% Gabapentin Cream Sig: 1-2 ml to affected area 3-4 times/day. Dispense: 240 GM bottle 1 each 2  . oxyCODONE-acetaminophen (PERCOCET) 10-325 MG tablet Take 1 tablet by mouth every 12 (twelve) hours. 30 tablet 0   No current facility-administered medications for this visit.     No Known Allergies  Family History  Problem Relation Age of Onset  . Alcohol abuse Mother   . Cervical cancer  Mother   . Hodgkin's lymphoma Sister     Social History   Social History  . Marital status: Married    Spouse name: N/A  . Number of children: N/A  . Years of education: N/A   Occupational History  . Not on file.   Social History Main Topics  . Smoking status: Current Every Day Smoker    Packs/day: 0.50    Years: 30.00    Types: Cigarettes  . Smokeless tobacco: Never Used  . Alcohol use Yes     Comment: occasional  . Drug use: No  . Sexual activity: Yes    Birth control/ protection: Surgical   Other Topics Concern  . Not on file   Social History Narrative  . No narrative on file     Constitutional: Denies fever, malaise, fatigue, headache or abrupt weight changes.  Musculoskeletal: Pt reports chronic neck pain. Denies difficulty with gait, muscle pain or joint swelling.  Neurological: Denies dizziness, difficulty with memory, difficulty with speech or problems with balance and coordination.    No other specific complaints in a complete review of systems (except as listed in HPI above).  Objective:   Physical Exam   BP 138/90   Pulse 91   Temp 98.2 F (36.8 C) (Oral)   Wt 116 lb (52.6 kg)   LMP 02/12/2011  SpO2 97%   BMI 23.43 kg/m  Wt Readings from Last 3 Encounters:  11/01/16 116 lb (52.6 kg)  10/17/16 116 lb (52.6 kg)  09/14/16 116 lb (52.6 kg)    General: Appears her stated age, well developed, well nourished in NAD. Musculoskeletal: Decreased flexion, and rotation of the cervical spine. Normal extension. Pain with palpation over the cervical spine.  Neurological: Alert and oriented.         Assessment & Plan:   Chronic Neck Pain:  30 mg Toradol IM today eRx for Etodolac 200 mg TID prn Refilled Voltaren Gel Advised her to continue Cymbalta as prescribed Advised her to make her appt for her pain psychologist ASAP She will follow up with pain management  Return precautions discussed Webb Silversmith, NP

## 2016-11-01 NOTE — Addendum Note (Signed)
Addended by: Lurlean Nanny on: 11/01/2016 02:36 PM   Modules accepted: Orders

## 2016-11-01 NOTE — Patient Instructions (Signed)
Neck Exercises Neck exercises can be important for many reasons:  They can help you to improve and maintain flexibility in your neck. This can be especially important as you age.  They can help to make your neck stronger. This can make movement easier.  They can reduce or prevent neck pain.  They may help your upper back.  Ask your health care provider which neck exercises would be best for you. Exercises Neck Press Repeat this exercise 10 times. Do it first thing in the morning and right before bed or as told by your health care provider. 1. Lie on your back on a firm bed or on the floor with a pillow under your head. 2. Use your neck muscles to push your head down on the pillow and straighten your spine. 3. Hold the position as well as you can. Keep your head facing up and your chin tucked. 4. Slowly count to 5 while holding this position. 5. Relax for a few seconds. Then repeat.  Isometric Strengthening Do a full set of these exercises 2 times a day or as told by your health care provider. 1. Sit in a supportive chair and place your hand on your forehead. 2. Push forward with your head and neck while pushing back with your hand. Hold for 10 seconds. 3. Relax. Then repeat the exercise 3 times. 4. Next, do thesequence again, this time putting your hand against the back of your head. Use your head and neck to push backward against the hand pressure. 5. Finally, do the same exercise on either side of your head, pushing sideways against the pressure of your hand.  Prone Head Lifts Repeat this exercise 5 times. Do this 2 times a day or as told by your health care provider. 1. Lie face-down, resting on your elbows so that your chest and upper back are raised. 2. Start with your head facing downward, near your chest. Position your chin either on or near your chest. 3. Slowly lift your head upward. Lift until you are looking straight ahead. Then continue lifting your head as far back as  you can stretch. 4. Hold your head up for 5 seconds. Then slowly lower it to your starting position.  Supine Head Lifts Repeat this exercise 8-10 times. Do this 2 times a day or as told by your health care provider. 1. Lie on your back, bending your knees to point to the ceiling and keeping your feet flat on the floor. 2. Lift your head slowly off the floor, raising your chin toward your chest. 3. Hold for 5 seconds. 4. Relax and repeat.  Scapular Retraction Repeat this exercise 5 times. Do this 2 times a day or as told by your health care provider. 1. Stand with your arms at your sides. Look straight ahead. 2. Slowly pull both shoulders backward and downward until you feel a stretch between your shoulder blades in your upper back. 3. Hold for 10-30 seconds. 4. Relax and repeat.  Contact a health care provider if:  Your neck pain or discomfort gets much worse when you do an exercise.  Your neck pain or discomfort does not improve within 2 hours after you exercise. If you have any of these problems, stop exercising right away. Do not do the exercises again unless your health care provider says that you can. Get help right away if:  You develop sudden, severe neck pain. If this happens, stop exercising right away. Do not do the exercises again unless your   health care provider says that you can. Exercises Neck Stretch  Repeat this exercise 3-5 times. 1. Do this exercise while standing or while sitting in a chair. 2. Place your feet flat on the floor, shoulder-width apart. 3. Slowly turn your head to the right. Turn it all the way to the right so you can look over your right shoulder. Do not tilt or tip your head. 4. Hold this position for 10-30 seconds. 5. Slowly turn your head to the left, to look over your left shoulder. 6. Hold this position for 10-30 seconds.  Neck Retraction Repeat this exercise 8-10 times. Do this 3-4 times a day or as told by your health care  provider. 1. Do this exercise while standing or while sitting in a sturdy chair. 2. Look straight ahead. Do not bend your neck. 3. Use your fingers to push your chin backward. Do not bend your neck for this movement. Continue to face straight ahead. If you are doing the exercise properly, you will feel a slight sensation in your throat and a stretch at the back of your neck. 4. Hold the stretch for 1-2 seconds. Relax and repeat.  This information is not intended to replace advice given to you by your health care provider. Make sure you discuss any questions you have with your health care provider. Document Released: 12/07/2014 Document Revised: 06/03/2015 Document Reviewed: 07/06/2014 [employer] Interactive Patient Education  2017 [employer]  

## 2016-11-08 ENCOUNTER — Encounter: Payer: Self-pay | Admitting: Student in an Organized Health Care Education/Training Program

## 2016-11-08 ENCOUNTER — Ambulatory Visit
Admission: RE | Admit: 2016-11-08 | Discharge: 2016-11-08 | Disposition: A | Discharge status: Home / Self Care | Payer: 59 | Source: Ambulatory Visit | Attending: Student in an Organized Health Care Education/Training Program | Admitting: Student in an Organized Health Care Education/Training Program

## 2016-11-08 ENCOUNTER — Ambulatory Visit (HOSPITAL_BASED_OUTPATIENT_CLINIC_OR_DEPARTMENT_OTHER): Payer: 59 | Admitting: Student in an Organized Health Care Education/Training Program

## 2016-11-08 VITALS — BP 181/79 | HR 99 | Temp 98.5°F | Resp 15 | Ht 59.0 in | Wt 116.0 lb

## 2016-11-08 DIAGNOSIS — M542 Cervicalgia: Secondary | ICD-10-CM | POA: Insufficient documentation

## 2016-11-08 DIAGNOSIS — M47812 Spondylosis without myelopathy or radiculopathy, cervical region: Secondary | ICD-10-CM

## 2016-11-08 DIAGNOSIS — G894 Chronic pain syndrome: Secondary | ICD-10-CM | POA: Diagnosis not present

## 2016-11-08 DIAGNOSIS — M4722 Other spondylosis with radiculopathy, cervical region: Secondary | ICD-10-CM | POA: Diagnosis not present

## 2016-11-08 MED ORDER — ROPIVACAINE HCL 2 MG/ML IJ SOLN
10.0000 mL | Freq: Once | INTRAMUSCULAR | Status: AC
  Administered 2016-11-08: 10 mL

## 2016-11-08 MED ORDER — ROPIVACAINE HCL 2 MG/ML IJ SOLN
INTRAMUSCULAR | Status: AC
  Filled 2016-11-08: qty 10

## 2016-11-08 MED ORDER — LIDOCAINE HCL (PF) 1 % IJ SOLN
INTRAMUSCULAR | Status: AC
  Filled 2016-11-08: qty 5

## 2016-11-08 MED ORDER — TIZANIDINE HCL 4 MG PO TABS: 4.0000 mg | ORAL_TABLET | Freq: Three times a day (TID) | ORAL | 1 refills | Status: AC

## 2016-11-08 MED ORDER — DEXAMETHASONE SODIUM PHOSPHATE 10 MG/ML IJ SOLN
INTRAMUSCULAR | Status: AC
  Filled 2016-11-08: qty 1

## 2016-11-08 MED ORDER — LIDOCAINE HCL (PF) 1 % IJ SOLN
10.0000 mL | Freq: Once | INTRAMUSCULAR | Status: AC
  Administered 2016-11-08: 5 mL

## 2016-11-08 NOTE — Progress Notes (Signed)
Safety precautions to be maintained throughout the outpatient stay will include: orient to surroundings, keep bed in low position, maintain call bell within reach at all times, provide assistance with transfer out of bed and ambulation.  

## 2016-11-08 NOTE — Patient Instructions (Signed)
Document on pain diary every hour today then daily until you return for follow up appointment   Post-procedure Information What to expect: Most procedures involve the use of a local anesthetic (numbing medicine), and a steroid (anti-inflammatory medicine).  The local anesthetics may cause temporary numbness and weakness of the legs or arms, depending on the location of the block. This numbness/weakness may last 4-6 hours, depending on the local anesthetic used. In rare instances, it can last up to 24 hours. While numb, you must be very careful not to injure the extremity.  After any procedure, you could expect the pain to get better within 15-20 minutes. This relief is temporary and may last 4-6 hours. Once the local anesthetics wears off, you could experience discomfort, possibly more than usual, for up to 10 (ten) days. In the case of radiofrequencies, it may last up to 6 weeks. Surgeries may take up to 8 weeks for the healing process. The discomfort is due to the irritation caused by needles going through skin and muscle. To minimize the discomfort, we recommend using ice the first day, and heat from then on. The ice should be applied for 15 minutes on, and 15 minutes off. Keep repeating this cycle until bedtime. Avoid applying the ice directly to the skin, to prevent frostbite. Heat should be used daily, until the pain improves (4-10 days). Be careful not to burn yourself.  Occasionally you may experience muscle spasms or cramps. These occur as a consequence of the irritation caused by the needle sticks to the muscle and the blood that will inevitably be lost into the surrounding muscle tissue. Blood tends to be very irritating to tissues, which tend to react by going into spasm. These spasms may start the same day of your procedure, but they may also take days to develop. This late onset type of spasm or cramp is usually caused by electrolyte imbalances triggered by the steroids, at the level of the  kidney. Cramps and spasms tend to respond well to muscle relaxants, multivitamins (some are triggered by the procedure, but may have their origins in vitamin deficiencies), and "Gatorade", or any sports drinks that can replenish any electrolyte imbalances. (If you are a diabetic, ask your pharmacist to get you a sugar-free brand.) Warm showers or baths may also be helpful. Stretching exercises are highly recommended. General Instructions:  Be alert for signs of possible infection: redness, swelling, heat, red streaks, elevated temperature, and/or fever. These typically appear 4 to 6 days after the procedure. Immediately notify your doctor if you experience unusual bleeding, difficulty breathing, or loss of bowel or bladder control. If you experience increased pain, do not increase your pain medicine intake, unless instructed by your pain physician. Post-Procedure Care:  Be careful in moving about. Muscle spasms in the area of the injection may occur. Applying ice or heat to the area is often helpful. The incidence of spinal headaches after epidural injections ranges between 1.4% and 6%. If you develop a headache that does not seem to respond to conservative therapy, please let your physician know. This can be treated with an epidural blood patch.   Post-procedure numbness or redness is to be expected, however it should average 4 to 6 hours. If numbness and weakness of your extremities begins to develop 4 to 6 hours after your procedure, and is felt to be progressing and worsening, immediately contact your physician.   Diet:  If you experience nausea, do not eat until this sensation goes away. If you  had a "Stellate Ganglion Block" for upper extremity "Reflex Sympathetic Dystrophy", do not eat or drink until your hoarseness goes away. In any case, always start with liquids first and if you tolerate them well, then slowly progress to more solid foods. Activity:  For the first 4 to 6 hours after the  procedure, use caution in moving about as you may experience numbness and/or weakness. Use caution in cooking, using household electrical appliances, and climbing steps. If you need to reach your Doctor call our office: [phone]) [phone] Monday-Thursday 8:00 am - 4:00 PM    Fridays: Closed     In case of an emergency: In case of emergency, call 911 or go to the nearest emergency room and have the physician there call us.  Interpretation of Procedure Every nerve block has two components: a diagnostic component, and a treatment component. Unrealistic expectations are the most common causes of "perceived failure".  In a perfect world, a single nerve block should be able to completely and permanently eliminate the pain. Sadly, the world is not perfect.  Most pain management nerve blocks are performed using local anesthetics and steroids. Steroids are responsible for any long-term benefit that you may experience. Their purpose is to decrease any chronic swelling that may exist in the area. Steroids begin to work immediately after being injected. However, most patients will not experience any benefits until 5 to 10 days after the injection, when the swelling has come down to the point where they can tell a difference. Steroids will only help if there is swelling to be treated. As such, they can assist with the diagnosis. If effective, they suggest an inflammatory component to the pain, and if ineffective, they rule out inflammation as the main cause or component of the problem. If the problem is one of mechanical compression, you will get no benefit from those steroids.   In the case of local anesthetics, they have a crucial role in the diagnosis of your condition. Most will begin to work within15 to 20 minutes after injection. The duration will depend on the type used (short- vs. Long-acting). It is of outmost importance that patients keep tract of their pain, after the procedure. To assist with this matter, a  "Post-procedure Pain Diary" is provided. Make sure to complete it and to bring it back to your follow-up appointment.  As long as the patient keeps accurate, detailed records of their symptoms after every procedure, and returns to have those interpreted, every procedure will provide [place] with invaluable information. Even a block that does not provide the patient with any relief, will always provide [place] with information about the mechanism and the origin of the pain. The only time a nerve block can be considered a waste of time is when patients do not keep track of the results, or do not keep their post-procedure appointment.  Reporting the results back to your physician The Pain Score  Pain is a subjective complaint. It cannot be seen, touched, or measured. We depend entirely on the patient's report of the pain in order to assess your condition and treatment. To evaluate the pain, we use a pain scale, where "0" means "No Pain", and a "10" is "the worst possible pain that you can even imagine" (i.e. something like been eaten alive by a shark or being torn apart by a lion).   You will frequently be asked to rate your pain. Please be as accurate, remember that medical decisions will be based on your responses. Please  do not rate your pain above a 10. Doing so is actually interpreted as "symptom magnification" (exaggeration), as well as lack of understanding with regards to the scale. To put this into perspective, when you tell us that your pain is at a 10 (ten), what you are saying is that there is nothing we can do to make this pain any worse. (Carefully think about that.)

## 2016-11-08 NOTE — Progress Notes (Signed)
Patient with high blood pressure post procedure.  Dr Holley Raring notified.  Patient c/o dizziness.  To recovery room for monitoring.

## 2016-11-08 NOTE — Progress Notes (Signed)
Patient's Name: Tammy Lang  MRN: [id number]  Referring Provider: Gillis Santa, MD  DOB: Feb 28, 1965  PCP: Jearld Fenton, NP  DOS: 11/08/2016  Note by: Gillis Santa, MD  Service setting: Ambulatory outpatient  Specialty: Interventional Pain Management  Patient type: Established  Location: [facility] (AMB) Pain Management Facility  Visit type: Interventional Procedure   Primary Reason for Visit: Interventional Pain Management Treatment. CC: Neck Pain  Procedure:  Anesthesia, Analgesia, Anxiolysis:  Type: Diagnostic Cervical Facet Medial Branch Block(s) Region: Posterolateral cervical spine region Level:  C5, C6, & C7 Medial Branch Level(s) Laterality: Bilateral Paraspinal  Type: Local Anesthesia Local Anesthetic: Lidocaine 1% Route: Infiltration ([place]/IM) IV Access: Declined Sedation: Meaningful verbal contact was maintained at all times during the procedure  Indication(s): Analgesia and Anxiety   Indications: 1. Spondylosis of cervical region without myelopathy or radiculopathy   2. Osteoarthritis of spine with radiculopathy, cervical region   3. Chronic pain syndrome    Pain Score: Pre-procedure: 8 /10 Post-procedure: 5 /10  Pre-op Assessment:  Tammy Lang is a 51 y.o. (year old), female patient, seen today for interventional treatment. She  has a past surgical history that includes Cesarean section; Bunionectomy; and Ganglion cyst excision. Tammy Lang has a current medication list which includes the following prescription(s): baclofen, diclofenac sodium, diphenhydramine, duloxetine, etodolac, NONFORMULARY OR COMPOUNDED ITEM, oxycodone-acetaminophen, and tizanidine. Her primarily concern today is the Neck Pain  Initial Vital Signs: Blood pressure (!) 181/79, pulse 99, temperature 98.5 F (36.9 C), temperature source Oral, resp. rate 15, height 4\' 11"  (1.499 m), weight 116 lb (52.6 kg), last menstrual period 02/12/2011, SpO2 99 %. BMI: Estimated body mass index is 23.43 kg/m as  calculated from the following:   Height as of this encounter: 4\' 11"  (1.499 m).   Weight as of this encounter: 116 lb (52.6 kg).  Risk Assessment: Allergies: Reviewed. She has No Known Allergies.  Allergy Precautions: None required Coagulopathies: Reviewed. None identified.  Blood-thinner therapy: None at this time Active Infection(s): Reviewed. None identified. Tammy Lang is afebrile  Site Confirmation: Tammy Lang was asked to confirm the procedure and laterality before marking the site Procedure checklist: Completed Consent: Before the procedure and under the influence of no sedative(s), amnesic(s), or anxiolytics, the patient was informed of the treatment options, risks and possible complications. To fulfill our ethical and legal obligations, as recommended by the American Medical Association's Code of Ethics, I have informed the patient of my clinical impression; the nature and purpose of the treatment or procedure; the risks, benefits, and possible complications of the intervention; the alternatives, including doing nothing; the risk(s) and benefit(s) of the alternative treatment(s) or procedure(s); and the risk(s) and benefit(s) of doing nothing. The patient was provided information about the general risks and possible complications associated with the procedure. These may include, but are not limited to: failure to achieve desired goals, infection, bleeding, organ or nerve damage, allergic reactions, paralysis, and death. In addition, the patient was informed of those risks and complications associated to Spine-related procedures, such as failure to decrease pain; infection (i.e.: Meningitis, epidural or intraspinal abscess); bleeding (i.e.: epidural hematoma, subarachnoid hemorrhage, or any other type of intraspinal or peri-dural bleeding); organ or nerve damage (i.e.: Any type of peripheral nerve, nerve root, or spinal cord injury) with subsequent damage to sensory, motor, and/or  autonomic systems, resulting in permanent pain, numbness, and/or weakness of one or several areas of the body; allergic reactions; (i.e.: anaphylactic reaction); and/or death. Furthermore, the patient was informed of those risks  and complications associated with the medications. These include, but are not limited to: allergic reactions (i.e.: anaphylactic or anaphylactoid reaction(s)); adrenal axis suppression; blood sugar elevation that in diabetics may result in ketoacidosis or comma; water retention that in patients with history of congestive heart failure may result in shortness of breath, pulmonary edema, and decompensation with resultant heart failure; weight gain; swelling or edema; medication-induced neural toxicity; particulate matter embolism and blood vessel occlusion with resultant organ, and/or nervous system infarction; and/or aseptic necrosis of one or more joints. Finally, the patient was informed that Medicine is not an exact science; therefore, there is also the possibility of unforeseen or unpredictable risks and/or possible complications that may result in a catastrophic outcome. The patient indicated having understood very clearly. We have given the patient no guarantees and we have made no promises. Enough time was given to the patient to ask questions, all of which were answered to the patient's satisfaction. Tammy Lang has indicated that she wanted to continue with the procedure. Attestation: I, the ordering provider, attest that I have discussed with the patient the benefits, risks, side-effects, alternatives, likelihood of achieving goals, and potential problems during recovery for the procedure that I have provided informed consent. Date: 11/08/2016; Time: 11:18 AM  Pre-Procedure Preparation:  Monitoring: As per clinic protocol. Respiration, ETCO2, SpO2, BP, heart rate and rhythm monitor placed and checked for adequate function Safety Precautions: Patient was assessed for  positional comfort and pressure points before starting the procedure. Time-out: I initiated and conducted the "Time-out" before starting the procedure, as per protocol. The patient was asked to participate by confirming the accuracy of the "Time Out" information. Verification of the correct person, site, and procedure were performed and confirmed by me, the nursing staff, and the patient. "Time-out" conducted as per Joint Commission's Universal Protocol (UP.01.01.01). "Time-out" Date & Time: 11/08/2016; 0957 hrs.  Description of Procedure Process:   Position: Prone with head of the table was raised to facilitate breathing. Target Area: For Cervical Facet blocks, the target is the postero-lateral waist of the articular pillars at the C5, C6, & C7 levels. Approach: Posterior approach. Area Prepped: Entire Posterior Cervico-thoracic Region Prepping solution: ChloraPrep (2% chlorhexidine gluconate and 70% isopropyl alcohol) Safety Precautions: Aspiration looking for blood return was conducted prior to all injections. At no point did we inject any substances, as a needle was being advanced. No attempts were made at seeking any paresthesias. Safe injection practices and needle disposal techniques used. Medications properly checked for expiration dates. SDV (single dose vial) medications used. Description of the Procedure: Protocol guidelines were followed. The patient was placed in position over the fluoroscopy table. The target area was identified and the area prepped in the usual manner. Skin desensitized using vapocoolant spray. Skin & deeper tissues infiltrated with local anesthetic. Appropriate amount of time allowed to pass for local anesthetics to take effect. The procedure needle was introduced through the skin, ipsilateral to the reported pain, and advanced to the target area. Bone was contacted on the posterior aspect of the articular pillars and the needle walked lateral, until the border was  cleared. Lateral views taken to make sure the needle tip did not advance past the posterior third of the lateral mass of the posterior columns. The procedure was repeated in identical fashion for each level. Negative aspiration confirmed. Solution injected in intermittent fashion, asking for systemic symptoms every 0.5cc of injectate. The needles were then removed and the area cleansed, making sure to leave some of the  prepping solution back to take advantage of its long term bactericidal properties. Vitals:   11/08/16 1040 11/08/16 1045 11/08/16 1055 11/08/16 1056  BP: (!) 172/92 (!) 171/81 (!) 165/86 (!) 181/79  Pulse:      Resp: 16 15    Temp:      TempSrc:      SpO2: 100% 99%    Weight:      Height:        Start Time: 0959 hrs. End Time: 1013 hrs. Materials:  Needle(s) Type: Regular needle Gauge: 22G Length: 3.5-in Medication(s): We administered lidocaine (PF) and ropivacaine (PF) 2 mg/mL (0.2%). Please see chart orders for dosing details. 1 cc of 0.2% ropivacaine at each cervical level. Imaging Guidance (Spinal):  Type of Imaging Technique: Fluoroscopy Guidance (Spinal) Indication(s): Assistance in needle guidance and placement for procedures requiring needle placement in or near specific anatomical locations not easily accessible without such assistance. Exposure Time: Please see nurses notes. Contrast: None used. Fluoroscopic Guidance: I was personally present during the use of fluoroscopy. "Tunnel Vision Technique" used to obtain the best possible view of the target area. Parallax error corrected before commencing the procedure. "Direction-depth-direction" technique used to introduce the needle under continuous pulsed fluoroscopy. Once target was reached, antero-posterior, oblique, and lateral fluoroscopic projection used confirm needle placement in all planes. Images permanently stored in EMR. Interpretation: No contrast injected. I personally interpreted the imaging  intraoperatively. Adequate needle placement confirmed in multiple planes. Permanent images saved into the patient's record.  Antibiotic Prophylaxis:  Indication(s): None identified Antibiotic given: None  Post-operative Assessment:  EBL: None Complications: No immediate post-treatment complications observed by team, or reported by patient. Note: Patient's blood pressure elevated at the end of procedure.  She was also complaining of some dizziness.  Patient brought to the recovery room and monitored.  She was given fluid intake as well as crackers.  Dizziness improved.  Blood pressure started to improve.  No chest pain, dizziness, vision changes or shortness of breath at discharge. A repeat set of vitals were taken after the procedure and the patient was kept under observation following institutional policy, for this type of procedure. Post-procedural neurological assessment was performed, showing return to baseline, prior to discharge. The patient was provided with post-procedure discharge instructions, including a section on how to identify potential problems. Should any problems arise concerning this procedure, the patient was given instructions to immediately contact us, at any time, without hesitation. In any case, we plan to contact the patient by telephone for a follow-up status report regarding this interventional procedure. Comments:  No additional relevant information. 5 out of 5 strength bilateral upper extremity: Shoulder abduction, elbow flexion, elbow extension, thumb extension.  Plan of Care   Imaging Orders     DG C-Arm 1-60 Min-No Report Procedure Orders    No procedure(s) ordered today    Medications ordered for procedure: Meds ordered this encounter  Medications  . lidocaine (PF) (XYLOCAINE) 1 % injection 10 mL  . ropivacaine (PF) 2 mg/mL (0.2%) (NAROPIN) injection 10 mL  . tiZANidine (ZANAFLEX) 4 MG tablet    Sig: Take 1 tablet (4 mg total) by mouth 3 (three) times  daily.    Dispense:  90 tablet    Refill:  1   Medications administered: We administered lidocaine (PF) and ropivacaine (PF) 2 mg/mL (0.2%).  See the medical record for exact dosing, route, and time of administration.  New Prescriptions   TIZANIDINE (ZANAFLEX) 4 MG TABLET    Take 1  tablet (4 mg total) by mouth 3 (three) times daily.   Disposition: Discharge home  Discharge Date & Time: 11/08/2016; 1110 hrs.   Physician-requested Follow-up: Return in about 4 weeks (around 12/06/2016) for Post Procedure Evaluation. Future Appointments Date Time Provider [place]  12/07/2016 8:45 AM Gillis Santa, MD [facility] None   Primary Care Physician: Jearld Fenton, NP Location: [facility] Note by: Gillis Santa, MD Date: 11/08/2016; Time: 11:21 AM  Disclaimer:  Medicine is not an exact science. The only guarantee in medicine is that nothing is guaranteed. It is important to note that the decision to proceed with this intervention was based on the information collected from the patient. The Data and conclusions were drawn from the patient's questionnaire, the interview, and the physical examination. Because the information was provided in large part by the patient, it cannot be guaranteed that it has not been purposely or unconsciously manipulated. Every effort has been made to obtain as much relevant data as possible for this evaluation. It is important to note that the conclusions that lead to this procedure are derived in large part from the available data. Always take into account that the treatment will also be dependent on availability of resources and existing treatment guidelines, considered by other Pain Management Practitioners as being common knowledge and practice, at the time of the intervention. For Medico-Legal purposes, it is also important to point out that variation in procedural techniques and pharmacological choices are the acceptable norm. The  indications, contraindications, technique, and results of the above procedure should only be interpreted and judged by a Board-Certified Interventional Pain Specialist with extensive familiarity and expertise in the same exact procedure and technique.

## 2016-11-09 ENCOUNTER — Telehealth: Payer: Self-pay | Admitting: *Deleted

## 2016-11-09 NOTE — Telephone Encounter (Signed)
Spoke with Tammy Lang, patient is sleeping.  He will give her the message that I called and if there are any questions or concerns she will call our office.

## 2016-12-07 ENCOUNTER — Encounter: Payer: Self-pay | Admitting: Student in an Organized Health Care Education/Training Program

## 2016-12-07 ENCOUNTER — Other Ambulatory Visit: Payer: Self-pay

## 2016-12-07 ENCOUNTER — Ambulatory Visit
Payer: 59 | Attending: Student in an Organized Health Care Education/Training Program | Admitting: Student in an Organized Health Care Education/Training Program

## 2016-12-07 VITALS — BP 141/97 | HR 89 | Temp 98.1°F | Resp 16 | Ht 59.0 in | Wt 116.0 lb

## 2016-12-07 DIAGNOSIS — M4722 Other spondylosis with radiculopathy, cervical region: Secondary | ICD-10-CM | POA: Diagnosis not present

## 2016-12-07 DIAGNOSIS — M4802 Spinal stenosis, cervical region: Secondary | ICD-10-CM | POA: Insufficient documentation

## 2016-12-07 DIAGNOSIS — Z79899 Other long term (current) drug therapy: Secondary | ICD-10-CM | POA: Diagnosis not present

## 2016-12-07 DIAGNOSIS — S139XXA Sprain of joints and ligaments of unspecified parts of neck, initial encounter: Secondary | ICD-10-CM | POA: Insufficient documentation

## 2016-12-07 DIAGNOSIS — M542 Cervicalgia: Secondary | ICD-10-CM | POA: Insufficient documentation

## 2016-12-07 DIAGNOSIS — G894 Chronic pain syndrome: Secondary | ICD-10-CM | POA: Insufficient documentation

## 2016-12-07 DIAGNOSIS — G5603 Carpal tunnel syndrome, bilateral upper limbs: Secondary | ICD-10-CM | POA: Diagnosis not present

## 2016-12-07 DIAGNOSIS — M47812 Spondylosis without myelopathy or radiculopathy, cervical region: Secondary | ICD-10-CM | POA: Diagnosis not present

## 2016-12-07 MED ORDER — OXYCODONE-ACETAMINOPHEN 10-325 MG PO TABS: 1.0000 | ORAL_TABLET | Freq: Two times a day (BID) | ORAL | 0 refills | Status: DC

## 2016-12-07 MED ORDER — DULOXETINE HCL 30 MG PO CPEP: 60.0000 mg | ORAL_CAPSULE | Freq: Every day | ORAL | 1 refills | Status: DC

## 2016-12-07 NOTE — Progress Notes (Signed)
Safety precautions to be maintained throughout the outpatient stay will include: orient to surroundings, keep bed in low position, maintain call bell within reach at all times, provide assistance with transfer out of bed and ambulation.  

## 2016-12-07 NOTE — Patient Instructions (Signed)
1. Increase Cymbalta 60 mg at night 2. Start Percocet therapy  3. Sign opioid agreement 4. Follow up in 2 months for med refill

## 2016-12-07 NOTE — Progress Notes (Deleted)
Patient's Name: Tammy Lang  MRN: [id number]  Referring Provider: Jearld Fenton, NP  DOB: [birth year]  PCP: Jearld Fenton, NP  DOS: 12/07/2016  Note by: Gillis Santa, MD  Service setting: Ambulatory outpatient  Specialty: Interventional Pain Management  Location: [facility] (AMB) Pain Management Facility    Patient type: Established   Primary Reason(s) for Visit: Encounter for prescription drug management. (Level of risk: moderate)  CC: Neck Pain  HPI  Tammy Lang is a 51 y.o. year old, female patient, who comes today for a medication management evaluation. She has Osteoarthritis of spine with radiculopathy, cervical region; Seasonal allergies; Spondylosis of cervical region without myelopathy or radiculopathy; Neck pain; Chronic pain syndrome; Neck sprain; and Bilateral carpal tunnel syndrome on their problem list. Her primarily concern today is the Neck Pain  Pain Assessment: Location: Right, Left Neck Radiating: both upper arms Onset: More than a month ago Duration: Chronic pain Quality: Burning Severity: 8 /10 (self-reported pain score)  Note: Reported level is compatible with observation.                         When using our objective Pain Scale, levels between 6 and 10/10 are said to belong in an emergency room, as it progressively worsens from a 6/10, described as severely limiting, requiring emergency care not usually available at an outpatient pain management facility. At a 6/10 level, communication becomes difficult and requires great effort. Assistance to reach the emergency department may be required. Facial flushing and profuse sweating along with potentially dangerous increases in heart rate and blood pressure will be evident. Effect on ADL:   Timing: Constant Modifying factors: nothing  Tammy Lang was last scheduled for an appointment on 11/08/2016 for medication management. During today's appointment we reviewed Tammy Lang's chronic pain status, as well as her  outpatient medication regimen.  The patient  reports that she does not use drugs. Her body mass index is 23.43 kg/m.  Further details on both, my assessment(s), as well as the proposed treatment plan, please see below.  Controlled Substance Pharmacotherapy Assessment REMS (Risk Evaluation and Mitigation Strategy)  Analgesic: *** MME/day: *** mg/day.  [name], RN  12/07/2016  8:27 AM  Sign at close encounter Safety precautions to be maintained throughout the outpatient stay will include: orient to surroundings, keep bed in low position, maintain call bell within reach at all times, provide assistance with transfer out of bed and ambulation.  Pharmacokinetics: Liberation and absorption (onset of action): WNL Distribution (time to peak effect): WNL Metabolism and excretion (duration of action): WNL         Pharmacodynamics: Desired effects: Analgesia: [name] reports >50% benefit. Functional ability: Patient reports that medication allows her to accomplish basic ADLs Clinically meaningful improvement in function (CMIF): Sustained CMIF goals met Perceived effectiveness: Described as relatively effective, allowing for increase in activities of daily living (ADL) Undesirable effects: Side-effects or Adverse reactions: None reported Monitoring: [place] PMP: Online review of the past 64-monthperiod conducted. Compliant with practice rules and regulations Last UDS on record: Summary  Date Value Ref Range Status  10/17/2016 FINAL  Final    Comment:    ==================================================================== [facility]RE COMP DRUG ANALYSIS,UR ==================================================================== Test                             Result       Flag       Units Drug Present  and Declared for Prescription Verification   Oxycodone                      470          EXPECTED   ng/mg creat   Oxymorphone                    750          EXPECTED   ng/mg creat    Noroxycodone                   1621         EXPECTED   ng/mg creat   Noroxymorphone                 257          EXPECTED   ng/mg creat    Sources of oxycodone are scheduled prescription medications.    Oxymorphone, noroxycodone, and noroxymorphone are expected    metabolites of oxycodone. Oxymorphone is also available as a    scheduled prescription medication.   Baclofen                       PRESENT      EXPECTED   Acetaminophen                  PRESENT      EXPECTED   Diphenhydramine                PRESENT      EXPECTED Drug Present not Declared for Prescription Verification   Gabapentin                     PRESENT      UNEXPECTED   Trazodone                      PRESENT      UNEXPECTED   1,3 chlorophenyl piperazine    PRESENT      UNEXPECTED    1,3-chlorophenyl piperazine is an expected metabolite of    trazodone.   Naproxen                       PRESENT      UNEXPECTED Drug Absent but Declared for Prescription Verification   Duloxetine                     Not Detected UNEXPECTED   Diclofenac                     Not Detected UNEXPECTED    Diclofenac, as indicated in the declared medication list, is not    always detected even when used as directed.   Lidocaine                      Not Detected UNEXPECTED    Lidocaine, as indicated in the declared medication list, is not    always detected even when used as directed. ==================================================================== Test                      Result    Flag   Units      Ref Range   Creatinine              159  mg/dL      >=20 ==================================================================== Declared Medications:  The flagging and interpretation on this report are based on the  following declared medications.  Unexpected results may arise from  inaccuracies in the declared medications.  **Note: The testing scope of this panel includes these medications:  Baclofen (Lioresal)  Diphenhydramine  (Benadryl)  Duloxetine (Cymbalta)  Oxycodone (Percocet)  **Note: The testing scope of this panel does not include small to  moderate amounts of these reported medications:  Acetaminophen (Percocet)  Diclofenac (Voltaren)  Lidocaine (Xylocaine)  **Note: The testing scope of this panel does not include following  reported medications:  Topical ==================================================================== For clinical consultation, please call [phone]. ====================================================================    UDS interpretation: Compliant          Medication Assessment Form: Reviewed. Patient indicates being compliant with therapy Treatment compliance: Compliant Risk Assessment Profile: Aberrant behavior: See prior evaluations. None observed or detected today Comorbid factors increasing risk of overdose: See prior notes. No additional risks detected today Risk of substance use disorder (SUD): Low Opioid Risk Tool - 10/17/16 1110      Family History of Substance Abuse   Alcohol  Positive Female    Illegal Drugs  Positive Female    Rx Drugs  Positive Female or Female      Personal History of Substance Abuse   Alcohol  Negative    Illegal Drugs  Negative    Rx Drugs  Negative      Age   Age between 59-45 years   No      History of Preadolescent Sexual Abuse   History of Preadolescent Sexual Abuse  Negative or Female      Psychological Disease   Psychological Disease  Negative    Depression  Negative      Total Score   Opioid Risk Tool Scoring  7    Opioid Risk Interpretation  Moderate Risk      ORT Scoring interpretation table:  Score <3 = Low Risk for SUD  Score between 4-7 = Moderate Risk for SUD  Score >8 = High Risk for Opioid Abuse   Risk Mitigation Strategies:  Patient Counseling: Covered Patient-Prescriber Agreement (PPA): Present and active  Notification to other healthcare providers: Done  Pharmacologic Plan: No change in therapy,  at this time  Laboratory Chemistry  Inflammation Markers (CRP: Acute Phase) (ESR: Chronic Phase) No results found for: CRP, ESRSEDRATE, LATICACIDVEN               Rheumatology Markers No results found for: RF, ANA, LABURIC, URICUR, LYMEIGGIGMAB, LYMEABIGMQN              Renal Function Markers No results found for: BUN, CREATININE, GFRAA, GFRNONAA               Hepatic Function Markers No results found for: AST, ALT, ALBUMIN, ALKPHOS, HCVAB, AMYLASE, LIPASE, AMMONIA               Electrolytes No results found for: NA, K, CL, CALCIUM, MG, PHOS               Neuropathy Markers No results found for: VITAMINB12, FOLATE, HGBA1C, HIV               Bone Pathology Markers No results found for: VD25OH, [id number], DS2876OT1, XB2620BT5, 25OHVITD1, 25OHVITD2, 25OHVITD3, TESTOFREE, TESTOSTERONE               Coagulation Parameters No results found for: INR, LABPROT, APTT, PLT, DDIMER  Cardiovascular Markers No results found for: BNP, CKTOTAL, CKMB, TROPONINI, HGB, HCT               CA Markers No results found for: CEA, CA125, LABCA2               Note: Lab results reviewed.  Recent Diagnostic Imaging Results  DG C-Arm 1-60 Min-No Report Fluoroscopy was utilized by the requesting physician.  No radiographic  interpretation.   Complexity Note: Imaging results reviewed. Results shared with Tammy Lang, using Layman's terms.                         Meds   Current Outpatient Medications:  .  baclofen (LIORESAL) 10 MG tablet, Take 10 mg by mouth 2 (two) times daily., Disp: , Rfl:  .  diclofenac sodium (VOLTAREN) 1 % GEL, Apply 2 g topically 4 (four) times daily., Disp: 1 Tube, Rfl: 2 .  diphenhydrAMINE (BENADRYL) 25 MG tablet, Take 25 mg by mouth every 6 (six) hours as needed., Disp: , Rfl:  .  DULoxetine (CYMBALTA) 30 MG capsule, Take 2 capsules (60 mg total) by mouth at bedtime., Disp: 60 capsule, Rfl: 1 .  etodolac (LODINE) 200 MG capsule, Take 1 capsule (200 mg  total) by mouth 3 (three) times daily., Disp: 90 capsule, Rfl: 0 .  NONFORMULARY OR COMPOUNDED ITEM, 10% Ketamine/2% Cyclobenzaprine/6% Gabapentin Cream Sig: 1-2 ml to affected area 3-4 times/day. Dispense: 240 GM bottle, Disp: 1 each, Rfl: 2 .  tiZANidine (ZANAFLEX) 4 MG tablet, Take 1 tablet (4 mg total) by mouth 3 (three) times daily., Disp: 90 tablet, Rfl: 1 .  oxyCODONE-acetaminophen (PERCOCET) 10-325 MG tablet, Take 1 tablet by mouth every 12 (twelve) hours. To be filled on or after: 12/07/16, 01/05/17, Disp: 60 tablet, Rfl: 0  ROS  Constitutional: Denies any fever or chills Gastrointestinal: No reported hemesis, hematochezia, vomiting, or acute GI distress Musculoskeletal: Denies any acute onset joint swelling, redness, loss of ROM, or weakness Neurological: No reported episodes of acute onset apraxia, aphasia, dysarthria, agnosia, amnesia, paralysis, loss of coordination, or loss of consciousness  Allergies  Tammy Lang has No Known Allergies.  PFSH  Drug: Tammy Lang  reports that she does not use drugs. Alcohol:  reports that she drinks alcohol. Tobacco:  reports that she has been smoking cigarettes.  She has a 15.00 pack-year smoking history. she has never used smokeless tobacco. Medical:  has a past medical history of Allergy, Arthritis, and Spinal stenosis of cervical region. Surgical: Tammy Lang  has a past surgical history that includes Cesarean section; Bunionectomy; and Ganglion cyst excision. Family: family history includes Alcohol abuse in her mother; Cervical cancer in her mother; Hodgkin's lymphoma in her sister.  Constitutional Exam  General appearance: Well nourished, well developed, and well hydrated. In no apparent acute distress Vitals:   12/07/16 0826  BP: (!) 141/97  Pulse: 89  Resp: 16  Temp: 98.1 F (36.7 C)  TempSrc: Oral  SpO2: 97%  Weight: 116 lb (52.6 kg)  Height: 4' 11"  (1.499 m)   BMI Assessment: Estimated body mass index is 23.43 kg/m as  calculated from the following:   Height as of this encounter: 4' 11"  (1.499 m).   Weight as of this encounter: 116 lb (52.6 kg).  BMI interpretation table: BMI level Category Range association with higher incidence of chronic pain  <18 kg/m2 Underweight   18.5-24.9 kg/m2 Ideal body weight   25-29.9 kg/m2 Overweight Increased incidence  by 20%  30-34.9 kg/m2 Obese (Class I) Increased incidence by 68%  35-39.9 kg/m2 Severe obesity (Class II) Increased incidence by 136%  >40 kg/m2 Extreme obesity (Class III) Increased incidence by 254%   BMI Readings from Last 4 Encounters:  12/07/16 23.43 kg/m  11/08/16 23.43 kg/m  11/01/16 23.43 kg/m  10/17/16 23.43 kg/m   Wt Readings from Last 4 Encounters:  12/07/16 116 lb (52.6 kg)  11/08/16 116 lb (52.6 kg)  11/01/16 116 lb (52.6 kg)  10/17/16 116 lb (52.6 kg)  Psych/Mental status: Alert, oriented x 3 (person, place, & time)       Eyes: PERLA Respiratory: No evidence of acute respiratory distress  Cervical Spine Area Exam  Skin & Axial Inspection: No masses, redness, edema, swelling, or associated skin lesions Alignment: Symmetrical Functional ROM: Unrestricted ROM      Stability: No instability detected Muscle Tone/Strength: Functionally intact. No obvious neuro-muscular anomalies detected. Sensory (Neurological): Unimpaired Palpation: No palpable anomalies              Upper Extremity (UE) Exam    Side: Right upper extremity  Side: Left upper extremity  Skin & Extremity Inspection: Skin color, temperature, and hair growth are WNL. No peripheral edema or cyanosis. No masses, redness, swelling, asymmetry, or associated skin lesions. No contractures.  Skin & Extremity Inspection: Skin color, temperature, and hair growth are WNL. No peripheral edema or cyanosis. No masses, redness, swelling, asymmetry, or associated skin lesions. No contractures.  Functional ROM: Unrestricted ROM          Functional ROM: Unrestricted ROM          Muscle  Tone/Strength: Functionally intact. No obvious neuro-muscular anomalies detected.  Muscle Tone/Strength: Functionally intact. No obvious neuro-muscular anomalies detected.  Sensory (Neurological): Unimpaired          Sensory (Neurological): Unimpaired          Palpation: No palpable anomalies              Palpation: No palpable anomalies              Specialized Test(s): Deferred         Specialized Test(s): Deferred          Thoracic Spine Area Exam  Skin & Axial Inspection: No masses, redness, or swelling Alignment: Symmetrical Functional ROM: Unrestricted ROM Stability: No instability detected Muscle Tone/Strength: Functionally intact. No obvious neuro-muscular anomalies detected. Sensory (Neurological): Unimpaired Muscle strength & Tone: No palpable anomalies  Lumbar Spine Area Exam  Skin & Axial Inspection: No masses, redness, or swelling Alignment: Symmetrical Functional ROM: Unrestricted ROM      Stability: No instability detected Muscle Tone/Strength: Functionally intact. No obvious neuro-muscular anomalies detected. Sensory (Neurological): Unimpaired Palpation: No palpable anomalies       Provocative Tests: Lumbar Hyperextension and rotation test: evaluation deferred today       Lumbar Lateral bending test: evaluation deferred today       Patrick's Maneuver: evaluation deferred today                    Gait & Posture Assessment  Ambulation: Unassisted Gait: Relatively normal for age and body habitus Posture: WNL   Lower Extremity Exam    Side: Right lower extremity  Side: Left lower extremity  Skin & Extremity Inspection: Skin color, temperature, and hair growth are WNL. No peripheral edema or cyanosis. No masses, redness, swelling, asymmetry, or associated skin lesions. No contractures.  Skin & Extremity Inspection: Skin color,  temperature, and hair growth are WNL. No peripheral edema or cyanosis. No masses, redness, swelling, asymmetry, or associated skin lesions. No  contractures.  Functional ROM: Unrestricted ROM          Functional ROM: Unrestricted ROM          Muscle Tone/Strength: Functionally intact. No obvious neuro-muscular anomalies detected.  Muscle Tone/Strength: Functionally intact. No obvious neuro-muscular anomalies detected.  Sensory (Neurological): Unimpaired  Sensory (Neurological): Unimpaired  Palpation: No palpable anomalies  Palpation: No palpable anomalies   Assessment  Primary Diagnosis & Pertinent Problem List: The primary encounter diagnosis was Spondylosis of cervical region without myelopathy or radiculopathy. Diagnoses of Osteoarthritis of spine with radiculopathy, cervical region, Chronic pain syndrome, Neck pain, and Cervicalgia were also pertinent to this visit.  Status Diagnosis  Controlled Controlled Controlled 1. Spondylosis of cervical region without myelopathy or radiculopathy   2. Osteoarthritis of spine with radiculopathy, cervical region   3. Chronic pain syndrome   4. Neck pain   5. Cervicalgia     Problems updated and reviewed during this visit: No problems updated. Plan of Care  Pharmacotherapy (Medications Ordered): Meds ordered this encounter  Medications  . DISCONTD: oxyCODONE-acetaminophen (PERCOCET) 10-325 MG tablet    Sig: Take 1 tablet by mouth every 12 (twelve) hours. To be filled on or after: 12/07/16, 01/05/17    Dispense:  60 tablet    Refill:  0  . DULoxetine (CYMBALTA) 30 MG capsule    Sig: Take 2 capsules (60 mg total) by mouth at bedtime.    Dispense:  60 capsule    Refill:  1  . oxyCODONE-acetaminophen (PERCOCET) 10-325 MG tablet    Sig: Take 1 tablet by mouth every 12 (twelve) hours. To be filled on or after: 12/07/16, 01/05/17    Dispense:  60 tablet    Refill:  0   Lab-work, procedure(s), and/or referral(s): No orders of the defined types were placed in this encounter.   Pharmacological management options:  Opioid Analgesics: We'll take over management today. See above  orders Membrane stabilizer: We have discussed the possibility of optimizing this mode of therapy, if tolerated Muscle relaxant: We have discussed the possibility of a trial NSAID: We have discussed the possibility of a trial Other analgesic(s): To be determined at a later time   Interventional management options: Planned, scheduled, and/or pending:    ***   Considering:   ***   PRN Procedures:   To be determined at a later time   Provider-requested follow-up: Return in about 8 weeks (around 02/01/2017) for Medication Management.  No future appointments.  Primary Care Physician: Jearld Fenton, NP Location: [facility] Note by: Gillis Santa, M.D Date: 12/07/2016; Time: 9:18 AM  Patient Instructions  1. Increase Cymbalta 60 mg at night 2. Start Percocet therapy  3. Sign opioid agreement 4. Follow up in 2 months for med refill

## 2016-12-07 NOTE — Progress Notes (Signed)
Patient's Name: Tammy Lang  MRN: [id number]  Referring Provider: Jearld Fenton, NP  DOB: [birth year]  PCP: Jearld Fenton, NP  DOS: 12/07/2016  Note by: Gillis Santa, MD  Service setting: Ambulatory outpatient  Specialty: Interventional Pain Management  Location: [facility] (AMB) Pain Management Facility    Patient type: Established   Primary Reason(s) for Visit: Encounter for prescription drug management & post-procedure evaluation of chronic illness with mild to moderate exacerbation(Level of risk: moderate) CC: Neck Pain  HPI  Tammy Lang is a 51 y.o. year old, female patient, who comes today for a post-procedure evaluation and medication management. She has Osteoarthritis of spine with radiculopathy, cervical region; Seasonal allergies; Spondylosis of cervical region without myelopathy or radiculopathy; Neck pain; Chronic pain syndrome; Neck sprain; and Bilateral carpal tunnel syndrome on their problem list. Her primarily concern today is the Neck Pain  Pain Assessment: Location: Right, Left Neck Radiating: both upper arms Onset: More than a month ago Duration: Chronic pain Quality: Burning Severity: 8 /10 (self-reported pain score)  Note: Reported level is inconsistent with clinical observations. Clinically the patient looks like a 3/10 A 3/10 is viewed as "Moderate" and described as significantly interfering with activities of daily living (ADL). It becomes difficult to feed, bathe, get dressed, get on and off the toilet or to perform personal hygiene functions. Difficult to get in and out of bed or a chair without assistance. Very distracting. With effort, it can be ignored when deeply involved in activities.       When using our objective Pain Scale, levels between 6 and 10/10 are said to belong in an emergency room, as it progressively worsens from a 6/10, described as severely limiting, requiring emergency care not usually available at an outpatient pain management facility. At a 6/10  level, communication becomes difficult and requires great effort. Assistance to reach the emergency department may be required. Facial flushing and profuse sweating along with potentially dangerous increases in heart rate and blood pressure will be evident. Effect on ADL:   Timing: Constant Modifying factors: nothing  Tammy Lang was last seen on 11/08/2016 for a procedure. During today's appointment we reviewed Tammy Lang's post-procedure results, as well as her outpatient medication regimen.   Patient following up status post bilateral C5, C6, C7 medial branch nerve block which was not effective for her neck and shoulder pain.  Patient does not endorse any significant benefit after our diagnostic block.  Patient has seen a pain psychologist and was deemed low to moderate risk for substance abuse disorder.  Today we will initiate opioid therapy.  She was previously taking Percocet 10 mg up to 3 times a day as needed.  We will start with 10 mg twice daily as needed.  Patient is continue her Cymbalta 30 milligrams nightly, we will increase to 60 mg nightly.  Further details on both, my assessment(s), as well as the proposed treatment plan, please see below.  Controlled Substance Pharmacotherapy Assessment REMS (Risk Evaluation and Mitigation Strategy)  Analgesic: Percocet 10 mg twice daily as needed MME/day: Approximately 25-30 mg/day.  [name], RN  12/07/2016  8:27 AM  Sign at close encounter Safety precautions to be maintained throughout the outpatient stay will include: orient to surroundings, keep bed in low position, maintain call bell within reach at all times, provide assistance with transfer out of bed and ambulation.  Pharmacokinetics: Liberation and absorption (onset of action): WNL Distribution (time to peak effect): WNL Metabolism and excretion (duration of action): WNL  Pharmacodynamics: Desired effects: Analgesia: Tammy Lang reports >50%  benefit. Functional ability: Patient reports that medication allows her to accomplish basic ADLs Clinically meaningful improvement in function (CMIF): Sustained CMIF goals met Perceived effectiveness: Described as relatively effective, allowing for increase in activities of daily living (ADL) Undesirable effects: Side-effects or Adverse reactions: None reported Monitoring: [place] PMP: Online review of the past 18-monthperiod conducted. Compliant with practice rules and regulations Last UDS on record: Summary  Date Value Ref Range Status  10/17/2016 FINAL  Final    Comment:    ==================================================================== [facility] COMP DRUG ANALYSIS,UR ==================================================================== Test                             Result       Flag       Units Drug Present and Declared for Prescription Verification   Oxycodone                      470          EXPECTED   ng/mg creat   Oxymorphone                    750          EXPECTED   ng/mg creat   Noroxycodone                   1621         EXPECTED   ng/mg creat   Noroxymorphone                 257          EXPECTED   ng/mg creat    Sources of oxycodone are scheduled prescription medications.    Oxymorphone, noroxycodone, and noroxymorphone are expected    metabolites of oxycodone. Oxymorphone is also available as a    scheduled prescription medication.   Baclofen                       PRESENT      EXPECTED   Acetaminophen                  PRESENT      EXPECTED   Diphenhydramine                PRESENT      EXPECTED Drug Present not Declared for Prescription Verification   Gabapentin                     PRESENT      UNEXPECTED   Trazodone                      PRESENT      UNEXPECTED   1,3 chlorophenyl piperazine    PRESENT      UNEXPECTED    1,3-chlorophenyl piperazine is an expected metabolite of    trazodone.   Naproxen                       PRESENT      UNEXPECTED Drug Absent but  Declared for Prescription Verification   Duloxetine                     Not Detected UNEXPECTED   Diclofenac  Not Detected UNEXPECTED    Diclofenac, as indicated in the declared medication list, is not    always detected even when used as directed.   Lidocaine                      Not Detected UNEXPECTED    Lidocaine, as indicated in the declared medication list, is not    always detected even when used as directed. ==================================================================== Test                      Result    Flag   Units      Ref Range   Creatinine              159              mg/dL      >=20 ==================================================================== Declared Medications:  The flagging and interpretation on this report are based on the  following declared medications.  Unexpected results may arise from  inaccuracies in the declared medications.  **Note: The testing scope of this panel includes these medications:  Baclofen (Lioresal)  Diphenhydramine (Benadryl)  Duloxetine (Cymbalta)  Oxycodone (Percocet)  **Note: The testing scope of this panel does not include small to  moderate amounts of these reported medications:  Acetaminophen (Percocet)  Diclofenac (Voltaren)  Lidocaine (Xylocaine)  **Note: The testing scope of this panel does not include following  reported medications:  Topical ==================================================================== For clinical consultation, please call [phone]. ====================================================================    UDS interpretation: Compliant          Medication Assessment Form: Reviewed. Patient indicates being compliant with therapy Treatment compliance: Compliant Risk Assessment Profile: Aberrant behavior: See prior evaluations. None observed or detected today Comorbid factors increasing risk of overdose: See prior notes. No additional risks detected today Risk of  substance use disorder (SUD): Low-to-Moderate Opioid Risk Tool - 10/17/16 1110      Family History of Substance Abuse   Alcohol  Positive Female    Illegal Drugs  Positive Female    Rx Drugs  Positive Female or Female      Personal History of Substance Abuse   Alcohol  Negative    Illegal Drugs  Negative    Rx Drugs  Negative      Age   Age between 28-45 years   No      History of Preadolescent Sexual Abuse   History of Preadolescent Sexual Abuse  Negative or Female      Psychological Disease   Psychological Disease  Negative    Depression  Negative      Total Score   Opioid Risk Tool Scoring  7    Opioid Risk Interpretation  Moderate Risk      ORT Scoring interpretation table:  Score <3 = Low Risk for SUD  Score between 4-7 = Moderate Risk for SUD  Score >8 = High Risk for Opioid Abuse   Risk Mitigation Strategies:  Patient Counseling: Completed today. Counseling provided to patient as per "Patient Counseling Document". Document signed by patient, attesting to counseling and understanding Patient-Prescriber Agreement (PPA): Obtained today  Notification to other healthcare providers: Written and sent today  Pharmacologic Plan: Today we will take over the chronic pain medication management and from this point on our medication agreement with this patient is active  Post-Procedure Assessment  11/08/2016 Procedure: Bilateral C5, C6, C7 medial branch nerve blocks Pre-procedure pain score:  8/10 Post-procedure pain score: 5/10  Influential Factors: BMI: 23.43 kg/m Intra-procedural challenges: None observed.         Assessment challenges: None detected.              Reported side-effects: None.        Post-procedural adverse reactions or complications: None reported         Sedation: Please see nurses note. When no sedatives are used, the analgesic levels obtained are directly associated to the effectiveness of the local anesthetics. However, when sedation is  provided, the level of analgesia obtained during the initial 1 hour following the intervention, is believed to be the result of a combination of factors. These factors may include, but are not limited to: 1. The effectiveness of the local anesthetics used. 2. The effects of the analgesic(s) and/or anxiolytic(s) used. 3. The degree of discomfort experienced by the patient at the time of the procedure. 4. The patients ability and reliability in recalling and recording the events. 5. The presence and influence of possible secondary gains and/or psychosocial factors. Reported result: Relief experienced during the 1st hour after the procedure: 100 % (Ultra-Short Term Relief)            Interpretative annotation: Clinically appropriate result. Analgesia during this period is likely to be Local Anesthetic and/or IV Sedative (Analgesic/Anxiolytic) related.          Effects of local anesthetic: The analgesic effects attained during this period are directly associated to the localized infiltration of local anesthetics and therefore cary significant diagnostic value as to the etiological location, or anatomical origin, of the pain. Expected duration of relief is directly dependent on the pharmacodynamics of the local anesthetic used. Long-acting (4-6 hours) anesthetics used.  Reported result: Relief during the next 4 to 6 hour after the procedure: 100 % (Short-Term Relief)            Interpretative annotation: Clinically appropriate result. Analgesia during this period is likely to be Local Anesthetic-related.          Long-term benefit: Defined as the period of time past the expected duration of local anesthetics (1 hour for short-acting and 4-6 hours for long-acting). With the possible exception of prolonged sympathetic blockade from the local anesthetics, benefits during this period are typically attributed to, or associated with, other factors such as analgesic sensory neuropraxia, antiinflammatory effects, or  beneficial biochemical changes provided by agents other than the local anesthetics.  Reported result: Extended relief following procedure: 0 % (Long-Term Relief)            Interpretative annotation: Unexpected result. No benefit. No permanent benefit expected. Limited inflammation. Possible mechanical aggravating factors.          Current benefits: Defined as reported results that persistent at this point in time.   Analgesia: 0 %            Function: No benefit ROM: No benefit Interpretative annotation: No benefit. No long-term benefit expected. Results would argue against repeating therapy.          Interpretation: Results would suggest failure of therapy in achieving desired goal(s).                  Plan:  Please see "Plan of Care" for details.         Meds   Current Outpatient Medications:  .  baclofen (LIORESAL) 10 MG tablet, Take 10 mg by mouth 2 (two) times daily., Disp: , Rfl:  .  diclofenac sodium (VOLTAREN) 1 % GEL, Apply  2 g topically 4 (four) times daily., Disp: 1 Tube, Rfl: 2 .  diphenhydrAMINE (BENADRYL) 25 MG tablet, Take 25 mg by mouth every 6 (six) hours as needed., Disp: , Rfl:  .  DULoxetine (CYMBALTA) 30 MG capsule, Take 2 capsules (60 mg total) by mouth at bedtime., Disp: 60 capsule, Rfl: 1 .  etodolac (LODINE) 200 MG capsule, Take 1 capsule (200 mg total) by mouth 3 (three) times daily., Disp: 90 capsule, Rfl: 0 .  NONFORMULARY OR COMPOUNDED ITEM, 10% Ketamine/2% Cyclobenzaprine/6% Gabapentin Cream Sig: 1-2 ml to affected area 3-4 times/day. Dispense: 240 GM bottle, Disp: 1 each, Rfl: 2 .  tiZANidine (ZANAFLEX) 4 MG tablet, Take 1 tablet (4 mg total) by mouth 3 (three) times daily., Disp: 90 tablet, Rfl: 1 .  oxyCODONE-acetaminophen (PERCOCET) 10-325 MG tablet, Take 1 tablet by mouth every 12 (twelve) hours. To be filled on or after: 12/07/16, 01/05/17, Disp: 60 tablet, Rfl: 0  ROS  Constitutional: Denies any fever or chills Gastrointestinal: No reported hemesis,  hematochezia, vomiting, or acute GI distress Musculoskeletal: Denies any acute onset joint swelling, redness, loss of ROM, or weakness Neurological: No reported episodes of acute onset apraxia, aphasia, dysarthria, agnosia, amnesia, paralysis, loss of coordination, or loss of consciousness  Allergies  Tammy Lang has No Known Allergies.  PFSH  Drug: Tammy Lang  reports that she does not use drugs. Alcohol:  reports that she drinks alcohol. Tobacco:  reports that she has been smoking cigarettes.  She has a 15.00 pack-year smoking history. she has never used smokeless tobacco. Medical:  has a past medical history of Allergy, Arthritis, and Spinal stenosis of cervical region. Surgical: Tammy Lang  has a past surgical history that includes Cesarean section; Bunionectomy; and Ganglion cyst excision. Family: family history includes Alcohol abuse in her mother; Cervical cancer in her mother; Hodgkin's lymphoma in her sister.  Constitutional Exam  General appearance: Well nourished, well developed, and well hydrated. In no apparent acute distress Vitals:   12/07/16 0826  BP: (!) 141/97  Pulse: 89  Resp: 16  Temp: 98.1 F (36.7 C)  TempSrc: Oral  SpO2: 97%  Weight: 116 lb (52.6 kg)  Height: _0  (1.499 m)   BMI Assessment: Estimated body mass index is 23.43 kg/m as calculated from the following:   Height as of this encounter: _1  (1.499 m).   Weight as of this encounter: 116 lb (52.6 kg).  BMI interpretation table: BMI level Category Range association with higher incidence of chronic pain  <18 kg/m2 Underweight   18.5-24.9 kg/m2 Ideal body weight   25-29.9 kg/m2 Overweight Increased incidence by 20%  30-34.9 kg/m2 Obese (Class I) Increased incidence by 68%  35-39.9 kg/m2 Severe obesity (Class II) Increased incidence by 136%  >40 kg/m2 Extreme obesity (Class III) Increased incidence by 254%   BMI Readings from Last 4 Encounters:  12/07/16 23.43 kg/m  11/08/16 23.43 kg/m   11/01/16 23.43 kg/m  10/17/16 23.43 kg/m   Wt Readings from Last 4 Encounters:  12/07/16 116 lb (52.6 kg)  11/08/16 116 lb (52.6 kg)  11/01/16 116 lb (52.6 kg)  10/17/16 116 lb (52.6 kg)  Psych/Mental status: Alert, oriented x 3 (person, place, & time)       Eyes: PERLA Respiratory: No evidence of acute respiratory distress  Cervical Spine Area Exam  Skin & Axial Inspection: No masses, redness, edema, swelling, or associated skin lesions Alignment: Symmetrical Functional ROM: Pain restricted ROM, bilaterally Stability: No instability detected Muscle Tone/Strength: Guarding observed  Sensory (Neurological): Musculoskeletal pain pattern Palpation: Complains of area being tender to palpation Positive provocative maneuver for for bilateral occipital neuralgia  Upper Extremity (UE) Exam    Side: Right upper extremity  Side: Left upper extremity  Skin & Extremity Inspection: Skin color, temperature, and hair growth are WNL. No peripheral edema or cyanosis. No masses, redness, swelling, asymmetry, or associated skin lesions. No contractures.  Skin & Extremity Inspection: Skin color, temperature, and hair growth are WNL. No peripheral edema or cyanosis. No masses, redness, swelling, asymmetry, or associated skin lesions. No contractures.  Functional ROM: Unrestricted ROM          Functional ROM: Unrestricted ROM          Muscle Tone/Strength: Functionally intact. No obvious neuro-muscular anomalies detected.  Muscle Tone/Strength: Functionally intact. No obvious neuro-muscular anomalies detected.  Sensory (Neurological): Unimpaired          Sensory (Neurological): Unimpaired          Palpation: No palpable anomalies              Palpation: No palpable anomalies              Specialized Test(s): Deferred         Specialized Test(s): Deferred          Thoracic Spine Area Exam  Skin & Axial Inspection: No masses, redness, or swelling Alignment: Symmetrical Functional ROM: Unrestricted  ROM Stability: No instability detected Muscle Tone/Strength: Functionally intact. No obvious neuro-muscular anomalies detected. Sensory (Neurological): Unimpaired Muscle strength & Tone: No palpable anomalies  Lumbar Spine Area Exam  Skin & Axial Inspection: No masses, redness, or swelling Alignment: Symmetrical Functional ROM: Unrestricted ROM      Stability: No instability detected Muscle Tone/Strength: Functionally intact. No obvious neuro-muscular anomalies detected. Sensory (Neurological): Unimpaired Palpation: No palpable anomalies       Provocative Tests: Lumbar Hyperextension and rotation test: evaluation deferred today       Lumbar Lateral bending test: evaluation deferred today       Patrick's Maneuver: evaluation deferred today                    Gait & Posture Assessment  Ambulation: Unassisted Gait: Relatively normal for age and body habitus Posture: WNL   Lower Extremity Exam    Side: Right lower extremity  Side: Left lower extremity  Skin & Extremity Inspection: Skin color, temperature, and hair growth are WNL. No peripheral edema or cyanosis. No masses, redness, swelling, asymmetry, or associated skin lesions. No contractures.  Skin & Extremity Inspection: Skin color, temperature, and hair growth are WNL. No peripheral edema or cyanosis. No masses, redness, swelling, asymmetry, or associated skin lesions. No contractures.  Functional ROM: Unrestricted ROM          Functional ROM: Unrestricted ROM          Muscle Tone/Strength: Functionally intact. No obvious neuro-muscular anomalies detected.  Muscle Tone/Strength: Functionally intact. No obvious neuro-muscular anomalies detected.  Sensory (Neurological): Unimpaired  Sensory (Neurological): Unimpaired  Palpation: No palpable anomalies  Palpation: No palpable anomalies   Assessment  Primary Diagnosis & Pertinent Problem List: The primary encounter diagnosis was Spondylosis of cervical region without myelopathy or  radiculopathy. Diagnoses of Osteoarthritis of spine with radiculopathy, cervical region, Chronic pain syndrome, Neck pain, and Cervicalgia were also pertinent to this visit.  Status Diagnosis  Persistent Persistent Persistent 1. Spondylosis of cervical region without myelopathy or radiculopathy   2. Osteoarthritis of spine with radiculopathy, cervical  region   3. Chronic pain syndrome   4. Neck pain   5. Cervicalgia      51 year old female with axial neck and shoulder pain that occasionally radiates to bilateral arms secondary to cervical spondylosis, cervical radiculopathy, cervical degenerative disc disease and cervical myofascial pain syndrome status post bilateral C5, C6, C7 diagnostic medial branch nerve blocks which were not effective for her pain symptoms or range of motion.  This would suggest a negative diagnostic intervention and do not plan to proceed with block #2 radiofrequency ablation.  Patient has seen pain psychology and is deemed low to moderate risk for substance abuse disorder.  Today we will have her complete opiate agreement and have her start her previous regimen of Percocet but at a reduced dose of 10 mg twice daily as needed.  Patient was previously taking 10 mg 3 times daily as needed.  I will increase her Cymbalta from 30 mg daily to 60 mg daily.  Plan: -Complete opiate agreement -Percocet 10 mg twice daily as needed, prescription below -Increase Cymbalta to 60 mg daily, prescription provided -Follow-up in 2 months or earlier.   Plan of Care  Pharmacotherapy (Medications Ordered): Meds ordered this encounter  Medications  . DISCONTD: oxyCODONE-acetaminophen (PERCOCET) 10-325 MG tablet    Sig: Take 1 tablet by mouth every 12 (twelve) hours. To be filled on or after: 12/07/16, 01/05/17    Dispense:  60 tablet    Refill:  0  . DULoxetine (CYMBALTA) 30 MG capsule    Sig: Take 2 capsules (60 mg total) by mouth at bedtime.    Dispense:  60 capsule    Refill:  1   . oxyCODONE-acetaminophen (PERCOCET) 10-325 MG tablet    Sig: Take 1 tablet by mouth every 12 (twelve) hours. To be filled on or after: 12/07/16, 01/05/17    Dispense:  60 tablet    Refill:  0    Provider-requested follow-up: Return in about 8 weeks (around 02/01/2017) for Medication Management.  Future Appointments  Date Time Provider [place]  02/01/2017  8:30 AM Gillis Santa, MD [facility] None    Primary Care Physician: Jearld Fenton, NP Location: [facility] Note by: Gillis Santa, M.D Date: 12/07/2016; Time: 3:10 PM  Patient Instructions  1. Increase Cymbalta 60 mg at night 2. Start Percocet therapy  3. Sign opioid agreement 4. Follow up in 2 months for med refill

## 2016-12-12 ENCOUNTER — Ambulatory Visit: Payer: 59 | Admitting: Family Medicine

## 2016-12-15 ENCOUNTER — Ambulatory Visit: Payer: 59 | Admitting: Family Medicine

## 2016-12-15 ENCOUNTER — Encounter: Payer: Self-pay | Admitting: Family Medicine

## 2016-12-15 VITALS — BP 144/86 | HR 87 | Temp 98.2°F | Wt 119.5 lb

## 2016-12-15 DIAGNOSIS — R21 Rash and other nonspecific skin eruption: Secondary | ICD-10-CM

## 2016-12-15 MED ORDER — CLOTRIMAZOLE-BETAMETHASONE 1-0.05 % EX CREA: 1.0000 "application " | TOPICAL_CREAM | Freq: Two times a day (BID) | CUTANEOUS | 0 refills | Status: DC

## 2016-12-15 NOTE — Progress Notes (Signed)
   Subjective:    Patient ID: Tammy Lang, female    DOB: 1965-09-07, 51 y.o.   MRN: [id number]  HPI This is a 51 yo female who presents today with rash on her chest x 2 weeks. No systemic symptoms. No new soaps, lotions, shampoos, laundry detergent. Has tried antifungal, OTC steroid without improvement. Feels otherwise fine, no fever/chills, no myalgias, no nausea/vomiting/abdominal pain. No pets, no household members with similar symptoms.   Past Medical History:  Diagnosis Date  . Allergy   . Arthritis   . Spinal stenosis of cervical region    Past Surgical History:  Procedure Laterality Date  . BUNIONECTOMY    . CESAREAN SECTION    . GANGLION CYST EXCISION     Family History  Problem Relation Age of Onset  . Alcohol abuse Mother   . Cervical cancer Mother   . Hodgkin's lymphoma Sister    Social History   Tobacco Use  . Smoking status: Current Every Day Smoker    Packs/day: 0.50    Years: 30.00    Pack years: 15.00    Types: Cigarettes  . Smokeless tobacco: Never Used  Substance Use Topics  . Alcohol use: Yes    Comment: occasional  . Drug use: No      Review of Systems Per HPI    Objective:   Physical Exam  Constitutional: She is oriented to person, place, and time. She appears well-developed and well-nourished. No distress.  HENT:  Head: Normocephalic and atraumatic.  Eyes: Conjunctivae are normal.  Cardiovascular: Normal rate.  Pulmonary/Chest: Effort normal.  Neurological: She is alert and oriented to person, place, and time.  Skin: Skin is warm and dry. Rash noted. She is not diaphoretic.  Area on upper left chest and lateral left abdomen- area of light center with darker border, slightly raised, no drainage.   Psychiatric: She has a normal mood and affect. Her behavior is normal. Judgment and thought content normal.  Vitals reviewed.     BP (!) 144/86 (BP Location: Right Arm, Patient Position: Sitting, Cuff Size: Normal)   Pulse 87   Temp  98.2 F (36.8 C) (Oral)   Wt 119 lb 8 oz (54.2 kg)   LMP 02/12/2011   SpO2 98%   BMI 24.14 kg/m  Wt Readings from Last 3 Encounters:  12/15/16 119 lb 8 oz (54.2 kg)  12/07/16 116 lb (52.6 kg)  11/08/16 116 lb (52.6 kg)   BP Readings from Last 3 Encounters:  12/15/16 (!) 144/86  12/07/16 (!) 141/97  11/08/16 (!) 181/79        Assessment & Plan:  1. Rash - looks like tinea - Provided written and verbal information regarding diagnosis and treatment. - RTC precautions reviewed - clotrimazole-betamethasone (LOTRISONE) cream; Apply 1 application topically 2 (two) times daily. For up to 2 weeks  Dispense: 45 g; Refill: 0   Clarene Reamer, FNP-BC  [facility], [place]  12/19/2016 11:23 AM

## 2016-12-15 NOTE — Patient Instructions (Signed)
Please let me know if you don't see improvement in 7-10 days   Body Ringworm Body ringworm is an infection of the skin that often causes a ring-shaped rash. Body ringworm can affect any part of your skin. It can spread easily to others. Body ringworm is also called tinea corporis. What are the causes? This condition is caused by funguses called dermatophytes. The condition develops when these funguses grow out of control on the skin. You can get this condition if you touch a person or animal that has it. You can also get it if you share clothing, bedding, towels, or any other object with an infected person or pet. What increases the risk? This condition is more likely to develop in:  Athletes who often make skin-to-skin contact with other athletes, such as wrestlers.  People who share equipment and mats.  People with a weakened immune system.  What are the signs or symptoms? Symptoms of this condition include:  Itchy, raised red spots and bumps.  Red scaly patches.  A ring-shaped rash. The rash may have: ? A clear center. ? Scales or red bumps at its center. ? Redness near its borders. ? Dry and scaly skin on or around it.  How is this diagnosed? This condition can usually be diagnosed with a skin exam. A skin scraping may be taken from the affected area and examined under a microscope to see if the fungus is present. How is this treated? This condition may be treated with:  An antifungal cream or ointment.  An antifungal shampoo.  Antifungal medicines. These may be prescribed if your ringworm is severe, keeps coming back, or lasts a long time.  Follow these instructions at home:  Take over-the-counter and prescription medicines only as told by your health care provider.  If you were given an antifungal cream or ointment: ? Use it as told by your health care provider. ? Wash the infected area and dry it completely before applying the cream or ointment.  If you were  given an antifungal shampoo: ? Use it as told by your health care provider. ? Leave the shampoo on your body for 3-5 minutes before rinsing.  While you have a rash: ? Wear loose clothing to stop clothes from rubbing and irritating it. ? Wash or change your bed sheets every night.  If your pet has the same infection, take your pet to see a Animal nutritionist. How is this prevented?  Practice good hygiene.  Wear sandals or shoes in public places and showers.  Do not share personal items with others.  Avoid touching red patches of skin on other people.  Avoid touching pets that have bald spots.  If you touch an animal that has a bald spot, wash your hands. Contact a health care provider if:  Your rash continues to spread after 7 days of treatment.  Your rash is not gone in 4 weeks.  The area around your rash gets red, warm, tender, and swollen. This information is not intended to replace advice given to you by your health care provider. Make sure you discuss any questions you have with your health care provider. Document Released: 12/24/1999 Document Revised: 06/03/2015 Document Reviewed: 10/22/2014 Elsevier Interactive Patient Education  [employer].

## 2016-12-19 ENCOUNTER — Encounter: Payer: Self-pay | Admitting: Family Medicine

## 2016-12-22 ENCOUNTER — Telehealth: Payer: Self-pay | Admitting: Internal Medicine

## 2016-12-22 ENCOUNTER — Ambulatory Visit: Payer: Self-pay | Admitting: *Deleted

## 2016-12-22 NOTE — Telephone Encounter (Signed)
Copied from [place]. Topic: Quick Communication - See Telephone Encounter >> Dec 22, 2016  8:20 AM Ether Griffins B wrote: CRM for notification. See Telephone encounter for:  Pt found large lump under right arm. Doesn't know if she needs to come in or have an ultrasound.  12/22/16.

## 2016-12-22 NOTE — Telephone Encounter (Signed)
Pt was scheduled 15' appt on 12/27/16 at 4:30 for cystic lump under lt arm? Please advise.

## 2016-12-22 NOTE — Telephone Encounter (Signed)
She needs to be seen to advise wether or not she will need an ultrasound. I will see her on the 19th

## 2016-12-22 NOTE — Telephone Encounter (Signed)
This is a duplicate message.

## 2016-12-22 NOTE — Telephone Encounter (Signed)
Patient has noticed a lump under her arm on her side- she is concerned because of her history of breast tumor.  Reason for Disposition . [1] Small swelling or lump AND [2] unexplained AND [3] present > 1 week  Answer Assessment - Initial Assessment Questions 1. APPEARANCE of SWELLING: "What does it look like?" (e.g., lymph node, insect bite, mole)     Soft mobile cystic area 2. SIZE: "How large is the swelling?" (inches, cm or compare to coins)     Larger than walnut 3. LOCATION: "Where is the swelling located?"     Under left arm- close to back on side- almost under arm pit 4. ONSET: "When did the swelling start?"     Noticed last- was checking to make sure that her ring worm had gone away 5. PAIN: "Is it painful?" If so, ask: "How much?"     Slightly painful 6. ITCH: "Does it itch?" If so, ask: "How much?"     no 7. CAUSE: "What do you think caused the swelling?"     Possible cyst 8. OTHER SYMPTOMS: "Do you have any other symptoms?" (e.g., fever)     no  Protocols used: SKIN LUMP OR LOCALIZED SWELLING-A-AH

## 2016-12-22 NOTE — Telephone Encounter (Signed)
Pt has 15' appt on 12/27/16 at 4:30 with Avie Echevaria NP for cystic lump under lt arm? Please advise.

## 2016-12-27 ENCOUNTER — Ambulatory Visit: Payer: 59 | Admitting: Internal Medicine

## 2016-12-27 VITALS — BP 140/86 | HR 81 | Temp 98.2°F | Wt 121.0 lb

## 2016-12-27 DIAGNOSIS — L729 Follicular cyst of the skin and subcutaneous tissue, unspecified: Secondary | ICD-10-CM

## 2016-12-29 ENCOUNTER — Encounter: Payer: Self-pay | Admitting: Internal Medicine

## 2016-12-29 NOTE — Progress Notes (Signed)
Subjective:    Patient ID: Tammy Lang, female    DOB: Jan 27, 1965, 51 y.o.   MRN: [id number]  HPI  Pt presents to the clinic today with c/o a lump near her left breast. The lump is tender to touch. She denies redness or drainage. The lump has not grown in size since she first noticed. She has had multiple cyst in the past. She has not tried anything OTC for her symptoms.  Review of Systems      Past Medical History:  Diagnosis Date  . Allergy   . Arthritis   . Spinal stenosis of cervical region     Current Outpatient Medications  Medication Sig Dispense Refill  . baclofen (LIORESAL) 10 MG tablet Take 10 mg by mouth 2 (two) times daily.    . clotrimazole-betamethasone (LOTRISONE) cream Apply 1 application topically 2 (two) times daily. For up to 2 weeks 45 g 0  . diclofenac sodium (VOLTAREN) 1 % GEL Apply 2 g topically 4 (four) times daily. 1 Tube 2  . diphenhydrAMINE (BENADRYL) 25 MG tablet Take 25 mg by mouth every 6 (six) hours as needed.    . DULoxetine (CYMBALTA) 30 MG capsule Take 2 capsules (60 mg total) by mouth at bedtime. 60 capsule 1  . etodolac (LODINE) 200 MG capsule Take 1 capsule (200 mg total) by mouth 3 (three) times daily. 90 capsule 0  . NONFORMULARY OR COMPOUNDED ITEM 10% Ketamine/2% Cyclobenzaprine/6% Gabapentin Cream Sig: 1-2 ml to affected area 3-4 times/day. Dispense: 240 GM bottle 1 each 2  . oxyCODONE-acetaminophen (PERCOCET) 10-325 MG tablet Take 1 tablet by mouth every 12 (twelve) hours. To be filled on or after: 12/07/16, 01/05/17 60 tablet 0  . tiZANidine (ZANAFLEX) 4 MG tablet Take 1 tablet (4 mg total) by mouth 3 (three) times daily. 90 tablet 1   No current facility-administered medications for this visit.     No Known Allergies  Family History  Problem Relation Age of Onset  . Alcohol abuse Mother   . Cervical cancer Mother   . Hodgkin's lymphoma Sister     Social History   Socioeconomic History  . Marital status: Married   Spouse name: Not on file  . Number of children: Not on file  . Years of education: Not on file  . Highest education level: Not on file  Social Needs  . Financial resource strain: Not on file  . Food insecurity - worry: Not on file  . Food insecurity - inability: Not on file  . Transportation needs - medical: Not on file  . Transportation needs - non-medical: Not on file  Occupational History  . Not on file  Tobacco Use  . Smoking status: Current Every Day Smoker    Packs/day: 0.50    Years: 30.00    Pack years: 15.00    Types: Cigarettes  . Smokeless tobacco: Never Used  Substance and Sexual Activity  . Alcohol use: Yes    Comment: occasional  . Drug use: No  . Sexual activity: Yes    Birth control/protection: Surgical  Other Topics Concern  . Not on file  Social History Narrative  . Not on file     Constitutional: Denies fever, malaise, fatigue, headache or abrupt weight changes.  Skin: Pt reports lump adjacent to left breast. Denies redness, rashes, or ulcercations.    No other specific complaints in a complete review of systems (except as listed in HPI above).  Objective:   Physical Exam  BP 140/86  Pulse 81   Temp 98.2 F (36.8 C) (Oral)   Wt 121 lb (54.9 kg)   LMP 02/12/2011   SpO2 98%   BMI 24.44 kg/m  Wt Readings from Last 3 Encounters:  12/27/16 121 lb (54.9 kg)  12/15/16 119 lb 8 oz (54.2 kg)  12/07/16 116 lb (52.6 kg)    General: Appears their stated age, well developed, well nourished in NAD. Skin: 1.5 cm smooth, round, mobile cyst of left chest wall, not a part of the breast tissue or lymphatic chain.       Assessment & Plan:   Cyst:  Will obtain ultrasound soft tissue, LUE for evaluation per patient request Advised her this is likely benign She is going to want referral to general surgery for removal but will hold off on referral until ultrasound results come back  Return precautions discussed Webb Silversmith, NP

## 2016-12-29 NOTE — Patient Instructions (Signed)
Epidermal Cyst An epidermal cyst is sometimes called an epidermal inclusion cyst or an infundibular cyst. It is a sac made of skin tissue. The sac contains a substance called keratin. Keratin is a protein that is normally secreted through the hair follicles. When keratin becomes trapped in the top layer of skin (epidermis), it can form an epidermal cyst. Epidermal cysts are usually found on the face, neck, trunk, and genitals. These cysts are usually harmless (benign), and they may not cause symptoms unless they become infected. It is important not to pop epidermal cysts yourself. What are the causes? This condition may be caused by:  A blocked hair follicle.  A hair that curls and re-enters the skin instead of growing straight out of the skin (ingrown hair).  A blocked pore.  Irritated skin.  An injury to the skin.  Certain conditions that are passed along from parent to child (inherited).  Human papillomavirus (HPV).  What increases the risk? The following factors may make you more likely to develop an epidermal cyst:  Having acne.  Being overweight.  Wearing tight clothing.  What are the signs or symptoms? The only symptom of this condition may be a small, painless lump underneath the skin. When an epidermal cyst becomes infected, symptoms may include:  Redness.  Inflammation.  Tenderness.  Warmth.  Fever.  Keratin draining from the cyst. Keratin may look like a grayish-white, bad-smelling substance.  Pus draining from the cyst.  How is this diagnosed? This condition is diagnosed with a physical exam. In some cases, you may have a sample of tissue (biopsy) taken from your cyst to be examined under a microscope or tested for bacteria. You may be referred to a health care provider who specializes in skin care (dermatologist). How is this treated? In many cases, epidermal cysts go away on their own without treatment. If a cyst becomes infected, treatment may  include:  Opening and draining the cyst. After draining, minor surgery to remove the rest of the cyst may be done.  Antibiotic medicine to help prevent infection.  Injections of medicines (steroids) that help to reduce inflammation.  Surgery to remove the cyst. Surgery may be done if: ? The cyst becomes large. ? The cyst bothers you. ? There is a chance that the cyst could turn into cancer.  Follow these instructions at home:  Take over-the-counter and prescription medicines only as told by your health care provider.  If you were prescribed an antibiotic, use it as told by your health care provider. Do not stop using the antibiotic even if you start to feel better.  Keep the area around your cyst clean and dry.  Wear loose, dry clothing.  Do not try to pop your cyst.  Avoid touching your cyst.  Check your cyst every day for signs of infection.  Keep all follow-up visits as told by your health care provider. This is important. How is this prevented?  Wear clean, dry, clothing.  Avoid wearing tight clothing.  Keep your skin clean and dry. Shower or take baths every day.  Wash your body with a benzoyl peroxide wash when you shower or bathe. Contact a health care provider if:  Your cyst develops symptoms of infection.  Your condition is not improving or is getting worse.  You develop a cyst that looks different from other cysts you have had.  You have a fever. Get help right away if:  Redness spreads from the cyst into the surrounding area. This information is   not intended to replace advice given to you by your health care provider. Make sure you discuss any questions you have with your health care provider. Document Released: 11/27/2003 Document Revised: 08/25/2015 Document Reviewed: 10/28/2014 Elsevier Interactive Patient Education  2018 [employer]  

## 2017-01-10 ENCOUNTER — Telehealth: Payer: Self-pay | Admitting: Internal Medicine

## 2017-01-10 ENCOUNTER — Ambulatory Visit
Admission: RE | Admit: 2017-01-10 | Discharge: 2017-01-10 | Disposition: A | Discharge status: Home / Self Care | Payer: 59 | Source: Ambulatory Visit | Attending: Internal Medicine | Admitting: Internal Medicine

## 2017-01-10 ENCOUNTER — Other Ambulatory Visit: Payer: Self-pay | Admitting: Internal Medicine

## 2017-01-10 DIAGNOSIS — L729 Follicular cyst of the skin and subcutaneous tissue, unspecified: Secondary | ICD-10-CM | POA: Diagnosis not present

## 2017-01-10 NOTE — Telephone Encounter (Signed)
Copied from [place] [id number]. Topic: Quick Communication - See Telephone Encounter >> Jan 10, 2017  4:53 PM Ivar Drape wrote: CRM for notification. See Telephone encounter for:  01/10/17. Colletta Maryland w/[facility] in the Ultrasound Dept. [phone] wanted the provider to know that they had to tweek the patient's order to a chest ultrasound.  They need the provider to sign off on the new order.

## 2017-01-11 ENCOUNTER — Other Ambulatory Visit: Payer: Self-pay | Admitting: Internal Medicine

## 2017-01-11 DIAGNOSIS — D171 Benign lipomatous neoplasm of skin and subcutaneous tissue of trunk: Secondary | ICD-10-CM

## 2017-01-11 NOTE — Telephone Encounter (Signed)
I signed this yesterday.

## 2017-01-19 ENCOUNTER — Other Ambulatory Visit: Payer: Self-pay | Admitting: General Surgery

## 2017-02-01 ENCOUNTER — Ambulatory Visit
Payer: 59 | Attending: Student in an Organized Health Care Education/Training Program | Admitting: Student in an Organized Health Care Education/Training Program

## 2017-02-01 ENCOUNTER — Encounter: Payer: Self-pay | Admitting: Student in an Organized Health Care Education/Training Program

## 2017-02-01 VITALS — BP 131/107 | HR 96 | Temp 98.7°F | Resp 16 | Ht 59.0 in | Wt 120.0 lb

## 2017-02-01 DIAGNOSIS — M4722 Other spondylosis with radiculopathy, cervical region: Secondary | ICD-10-CM | POA: Diagnosis not present

## 2017-02-01 DIAGNOSIS — I10 Essential (primary) hypertension: Secondary | ICD-10-CM | POA: Diagnosis not present

## 2017-02-01 DIAGNOSIS — G5603 Carpal tunnel syndrome, bilateral upper limbs: Secondary | ICD-10-CM | POA: Insufficient documentation

## 2017-02-01 DIAGNOSIS — M542 Cervicalgia: Secondary | ICD-10-CM | POA: Diagnosis not present

## 2017-02-01 DIAGNOSIS — M47892 Other spondylosis, cervical region: Secondary | ICD-10-CM | POA: Diagnosis not present

## 2017-02-01 DIAGNOSIS — M47812 Spondylosis without myelopathy or radiculopathy, cervical region: Secondary | ICD-10-CM

## 2017-02-01 DIAGNOSIS — M5010 Cervical disc disorder with radiculopathy, unspecified cervical region: Secondary | ICD-10-CM | POA: Insufficient documentation

## 2017-02-01 DIAGNOSIS — G894 Chronic pain syndrome: Secondary | ICD-10-CM | POA: Insufficient documentation

## 2017-02-01 DIAGNOSIS — Z79899 Other long term (current) drug therapy: Secondary | ICD-10-CM | POA: Diagnosis not present

## 2017-02-01 DIAGNOSIS — G629 Polyneuropathy, unspecified: Secondary | ICD-10-CM | POA: Insufficient documentation

## 2017-02-01 DIAGNOSIS — Z760 Encounter for issue of repeat prescription: Secondary | ICD-10-CM | POA: Insufficient documentation

## 2017-02-01 MED ORDER — LIDOCAINE 5 % EX PTCH: 1.0000 | MEDICATED_PATCH | CUTANEOUS | 0 refills | Status: DC

## 2017-02-01 MED ORDER — OXYCODONE-ACETAMINOPHEN 10-325 MG PO TABS: 1.0000 | ORAL_TABLET | Freq: Three times a day (TID) | ORAL | 0 refills | Status: DC | PRN

## 2017-02-01 MED ORDER — DULOXETINE HCL 30 MG PO CPEP: 30.0000 mg | ORAL_CAPSULE | Freq: Every day | ORAL | 1 refills | Status: DC

## 2017-02-01 NOTE — Progress Notes (Signed)
Nursing Pain Medication Assessment:  Safety precautions to be maintained throughout the outpatient stay will include: orient to surroundings, keep bed in low position, maintain call bell within reach at all times, provide assistance with transfer out of bed and ambulation.  Medication Inspection Compliance: Pill count conducted under aseptic conditions, in front of the patient. Neither the pills nor the bottle was removed from the patient's sight at any time. Once count was completed pills were immediately returned to the patient in their original bottle.  Medication: Oxycodone/APAP Pill/Patch Count: 4 of 60 pills remain Pill/Patch Appearance: Markings consistent with prescribed medication Bottle Appearance: Standard pharmacy container. Clearly labeled. Filled Date: 01 / 02 / 2019 Last Medication intake:  Yesterday

## 2017-02-01 NOTE — Progress Notes (Signed)
Patient's Name: Tammy Lang  MRN: [id number]  Referring Provider: Jearld Fenton, NP  DOB: [birth year]  PCP: Jearld Fenton, NP  DOS: 02/01/2017  Note by: Gillis Santa, MD  Service setting: Ambulatory outpatient  Specialty: Interventional Pain Management  Location: [facility] (AMB) Pain Management Facility    Patient type: Established   Primary Reason(s) for Visit: Encounter for prescription drug management. (Level of risk: moderate)  CC: Neck Pain (middle)  HPI  Tammy Lang is a 52 y.o. year old, female patient, who comes today for a medication management evaluation. She has Osteoarthritis of spine with radiculopathy, cervical region; Seasonal allergies; Chronic pain syndrome; and Bilateral carpal tunnel syndrome on their problem list. Her primarily concern today is the Neck Pain (middle)  Pain Assessment: Location: Mid Neck Radiating: going into both arms and into the elbows Onset: More than a month ago Duration: Chronic pain Quality: Burning, Numbness, Constant, Discomfort Severity: 8 /10 (self-reported pain score)  Note: Reported level is inconsistent with clinical observations. Clinically the patient looks like a 2/10             When using our objective Pain Scale, levels between 6 and 10/10 are said to belong in an emergency room, as it progressively worsens from a 6/10, described as severely limiting, requiring emergency care not usually available at an outpatient pain management facility. At a 6/10 level, communication becomes difficult and requires great effort. Assistance to reach the emergency department may be required. Facial flushing and profuse sweating along with potentially dangerous increases in heart rate and blood pressure will be evident. Effect on ADL: patient wakes up with arms numb and takes them a long time to "wake up" Timing: Constant Modifying factors: nothing currently  Tammy Lang was last scheduled for an appointment on 12/07/2016 for medication management.  During today's appointment we reviewed Tammy Lang's chronic pain status, as well as her outpatient medication regimen.  Patient presents today for follow-up.  At her last visit, Cymbalta was increased to 60 mg.  She has noted increase in blood pressure since dose titration of Cymbalta.  Patient continues to complain of severe neck pain that radiates into bilateral hands.  She also endorses numbness and tingling in her hands.  She has not endorsing any significant weakness.  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  She states that she has been taking more of her Percocet 2 tablets every 12 hours.  She states that the Cymbalta is helpful but that she had to decrease the dose given that it was increasing her blood pressure and that she has been taking more Percocet as as a result.  She is requesting if we can increase her dose to 20 mg twice daily.  Otherwise patient is status post bilateral C5-C7 cervical facet blocks without any significant improvement in her neck pain.  The patient  reports that she does not use drugs. Her body mass index is 24.24 kg/m.  Further details on both, my assessment(s), as well as the proposed treatment plan, please see below.  Controlled Substance Pharmacotherapy Assessment REMS (Risk Evaluation and Mitigation Strategy)  Analgesic: Oxycodone 10 mg twice daily as needed, quantity 73-month  Will increase to 10 mg 3 times daily as needed, quantity 968-monthME/day: 30-45 mg/day.  PaJanett BillowRN  02/01/2017  8:24 AM  Sign at close encounter Nursing Pain Medication Assessment:  Safety precautions to be maintained throughout the outpatient stay will include: orient to surroundings, keep bed in  low position, maintain call bell within reach at all times, provide assistance with transfer out of bed and ambulation.  Medication Inspection Compliance: Pill count conducted under aseptic conditions, in front of the patient. Neither the pills nor the  bottle was removed from the patient's sight at any time. Once count was completed pills were immediately returned to the patient in their original bottle.  Medication: Oxycodone/APAP Pill/Patch Count: 4 of 60 pills remain Pill/Patch Appearance: Markings consistent with prescribed medication Bottle Appearance: Standard pharmacy container. Clearly labeled. Filled Date: 01 / 02 / 2019 Last Medication intake:  Yesterday   Pharmacokinetics: Liberation and absorption (onset of action): WNL Distribution (time to peak effect): WNL Metabolism and excretion (duration of action): WNL         Pharmacodynamics: Desired effects: Analgesia: Ms. Lovins reports >50% benefit. Functional ability: Patient reports that medication allows her to accomplish basic ADLs Clinically meaningful improvement in function (CMIF): Sustained CMIF goals met Perceived effectiveness: Described as relatively effective, allowing for increase in activities of daily living (ADL) Undesirable effects: Side-effects or Adverse reactions: None reported Monitoring: [place] PMP: Online review of the past 44-monthperiod conducted. Compliant with practice rules and regulations Last UDS on record: Summary  Date Value Ref Range Status  10/17/2016 FINAL  Final    Comment:    ==================================================================== [facility]XAS[facility]RE COMP DRUG ANALYSIS,UR ==================================================================== Test                             Result       Flag       Units Drug Present and Declared for Prescription Verification   Oxycodone                      470          EXPECTED   ng/mg creat   Oxymorphone                    750          EXPECTED   ng/mg creat   Noroxycodone                   1621         EXPECTED   ng/mg creat   Noroxymorphone                 257          EXPECTED   ng/mg creat    Sources of oxycodone are scheduled prescription medications.    Oxymorphone, noroxycodone, and  noroxymorphone are expected    metabolites of oxycodone. Oxymorphone is also available as a    scheduled prescription medication.   Baclofen                       PRESENT      EXPECTED   Acetaminophen                  PRESENT      EXPECTED   Diphenhydramine                PRESENT      EXPECTED Drug Present not Declared for Prescription Verification   Gabapentin                     PRESENT      UNEXPECTED   Trazodone  PRESENT      UNEXPECTED   1,3 chlorophenyl piperazine    PRESENT      UNEXPECTED    1,3-chlorophenyl piperazine is an expected metabolite of    trazodone.   Naproxen                       PRESENT      UNEXPECTED Drug Absent but Declared for Prescription Verification   Duloxetine                     Not Detected UNEXPECTED   Diclofenac                     Not Detected UNEXPECTED    Diclofenac, as indicated in the declared medication list, is not    always detected even when used as directed.   Lidocaine                      Not Detected UNEXPECTED    Lidocaine, as indicated in the declared medication list, is not    always detected even when used as directed. ==================================================================== Test                      Result    Flag   Units      Ref Range   Creatinine              159              mg/dL      >=20 ==================================================================== Declared Medications:  The flagging and interpretation on this report are based on the  following declared medications.  Unexpected results may arise from  inaccuracies in the declared medications.  **Note: The testing scope of this panel includes these medications:  Baclofen (Lioresal)  Diphenhydramine (Benadryl)  Duloxetine (Cymbalta)  Oxycodone (Percocet)  **Note: The testing scope of this panel does not include small to  moderate amounts of these reported medications:  Acetaminophen (Percocet)  Diclofenac (Voltaren)  Lidocaine  (Xylocaine)  **Note: The testing scope of this panel does not include following  reported medications:  Topical ==================================================================== For clinical consultation, please call [phone]. ====================================================================    UDS interpretation: Compliant          Medication Assessment Form: Reviewed. Patient indicates being compliant with therapy Treatment compliance: Compliant Risk Assessment Profile: Aberrant behavior: See prior evaluations. None observed or detected today Comorbid factors increasing risk of overdose: See prior notes. No additional risks detected today Risk of substance use disorder (SUD): Low Opioid Risk Tool - 02/01/17 0823      Family History of Substance Abuse   Alcohol  Positive Female    Illegal Drugs  Negative    Rx Drugs  Negative      Personal History of Substance Abuse   Alcohol  Negative    Illegal Drugs  Negative    Rx Drugs  Negative      Psychological Disease   Psychological Disease  Negative    Depression  Negative      Total Score   Opioid Risk Tool Scoring  1    Opioid Risk Interpretation  Low Risk      ORT Scoring interpretation table:  Score <3 = Low Risk for SUD  Score between 4-7 = Moderate Risk for SUD  Score >8 = High Risk for Opioid Abuse   Risk Mitigation Strategies:  Patient Counseling: Covered Patient-Prescriber  Agreement (PPA): Present and active  Notification to other healthcare providers: Done  Pharmacologic Plan: Given patient's inability to tolerate increase in Cymbalta secondary to hypertension and already being on multimodal analgesics including NSAID, muscle relaxant, neuropathic agent, I will allow a one-time increase to oxycodone 10 mg 3 times daily as needed, quantity 4-month  No further dose escalation beyond this.  Current MME 45             Laboratory Chemistry  Inflammation Markers (CRP: Acute Phase) (ESR: Chronic  Phase) No results found for: CRP, ESRSEDRATE, LATICACIDVEN               Rheumatology Markers No results found for: RF, ANA, LABURIC, URICUR, LYMEIGGIGMAB, LYMEABIGMQN              Renal Function Markers No results found for: BUN, CREATININE, GFRAA, GFRNONAA               Hepatic Function Markers No results found for: AST, ALT, ALBUMIN, ALKPHOS, HCVAB, AMYLASE, LIPASE, AMMONIA               Electrolytes No results found for: NA, K, CL, CALCIUM, MG, PHOS               Neuropathy Markers No results found for: VITAMINB12, FOLATE, HGBA1C, HIV               Bone Pathology Markers No results found for: VD25OH, [id number] 25OHVITD1, 25OHVITD2, 25OHVITD3, TESTOFREE, TESTOSTERONE               Coagulation Parameters No results found for: INR, LABPROT, APTT, PLT, DDIMER               Cardiovascular Markers No results found for: BNP, CKTOTAL, CKMB, TROPONINI, HGB, HCT               CA Markers No results found for: CEA, CA125, LABCA2               Note: Lab results reviewed.  Recent Diagnostic Imaging Results  [place]ST SOFT TISSUE CLINICAL DATA:  Probable layering of the left lateral chest for 2 weeks.  EXAM: ULTRASOUND OF HEAD/NECK SOFT TISSUES  TECHNIQUE: Ultrasound examination of the head and neck soft tissues was performed in the area of clinical concern.  COMPARISON:  None.  FINDINGS: Limited ultrasound images obtained in the left lateral chest over the area of palpable abnormality demonstrate an ovoid circumscribed nodule in the subcutaneous tissues. 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 The lesion measures 3.3 x 0.9 x 4.5 cm. No significant flow is demonstrated on color flow Doppler imaging. No fluid collections.  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 This corresponds to the palpable abnormality.  Electronically Signed    By: WLucienne CapersM.D.   On: 01/10/2017 18:39  Complexity Note: Imaging results reviewed. Results shared with Ms. Tagliaferro, using Layman's terms.                         Meds   Current Outpatient Medications:  .  clotrimazole-betamethasone (LOTRISONE) cream, Apply 1 application topically 2 (two) times daily. For up to 2 weeks, Disp: 45 g, Rfl: 0 .  diphenhydrAMINE (BENADRYL) 25 MG tablet, Take 25 mg by mouth every 6 (six) hours as needed., Disp: , Rfl:  .  DULoxetine (CYMBALTA) 30 MG capsule, Take 1 capsule (  30 mg total) by mouth at bedtime., Disp: 60 capsule, Rfl: 1 .  etodolac (LODINE) 200 MG capsule, Take 1 capsule (200 mg total) by mouth 3 (three) times daily., Disp: 90 capsule, Rfl: 0 .  oxyCODONE-acetaminophen (PERCOCET) 10-325 MG tablet, Take 1 tablet by mouth every 8 (eight) hours as needed for pain. To be filled on or after: 02/09/17, 03/09/17, Disp: 90 tablet, Rfl: 0 .  tiZANidine (ZANAFLEX) 4 MG tablet, Take 1 tablet (4 mg total) by mouth 3 (three) times daily., Disp: 90 tablet, Rfl: 1 .  baclofen (LIORESAL) 10 MG tablet, Take 10 mg by mouth 2 (two) times daily., Disp: , Rfl:  .  diclofenac sodium (VOLTAREN) 1 % GEL, Apply 2 g topically 4 (four) times daily. (Patient not taking: Reported on 02/01/2017), Disp: 1 Tube, Rfl: 2 .  lidocaine (LIDODERM) 5 %, Place 1 patch onto the skin daily. Remove & Discard patch within 12 hours or as directed by MD, Disp: 30 patch, Rfl: 0  ROS  Constitutional: Denies any fever or chills Gastrointestinal: No reported hemesis, hematochezia, vomiting, or acute GI distress Musculoskeletal: Denies any acute onset joint swelling, redness, loss of ROM, or weakness Neurological: No reported episodes of acute onset apraxia, aphasia, dysarthria, agnosia, amnesia, paralysis, loss of coordination, or loss of consciousness  Allergies  Ms. Reicher has No Known Allergies.  PFSH  Drug: Ms. Gang  reports that she does not use drugs. Alcohol:  reports that she  drinks alcohol. Tobacco:  reports that she has been smoking cigarettes.  She has a 15.00 pack-year smoking history. she has never used smokeless tobacco. Medical:  has a past medical history of Allergy, Arthritis, and Spinal stenosis of cervical region. Surgical: Ms. Buley  has a past surgical history that includes Cesarean section; Bunionectomy; and Ganglion cyst excision. Family: family history includes Alcohol abuse in her mother; Cervical cancer in her mother; Hodgkin's lymphoma in her sister.  Constitutional Exam  General appearance: Well nourished, well developed, and well hydrated. In no apparent acute distress Vitals:   02/01/17 0818  BP: (!) 131/107  Pulse: 96  Resp: 16  Temp: 98.7 F (37.1 C)  TempSrc: Oral  SpO2: 97%  Weight: 120 lb (54.4 kg)  Height: 4' 11"  (1.499 m)   BMI Assessment: Estimated body mass index is 24.24 kg/m as calculated from the following:   Height as of this encounter: 4' 11"  (1.499 m).   Weight as of this encounter: 120 lb (54.4 kg).  BMI interpretation table: BMI level Category Range association with higher incidence of chronic pain  <18 kg/m2 Underweight   18.5-24.9 kg/m2 Ideal body weight   25-29.9 kg/m2 Overweight Increased incidence by 20%  30-34.9 kg/m2 Obese (Class I) Increased incidence by 68%  35-39.9 kg/m2 Severe obesity (Class II) Increased incidence by 136%  >40 kg/m2 Extreme obesity (Class III) Increased incidence by 254%   BMI Readings from Last 4 Encounters:  02/01/17 24.24 kg/m  12/27/16 24.44 kg/m  12/15/16 24.14 kg/m  12/07/16 23.43 kg/m   Wt Readings from Last 4 Encounters:  02/01/17 120 lb (54.4 kg)  12/27/16 121 lb (54.9 kg)  12/15/16 119 lb 8 oz (54.2 kg)  12/07/16 116 lb (52.6 kg)  Psych/Mental status: Alert, oriented x 3 (person, place, & time)       Eyes: PERLA Respiratory: No evidence of acute respiratory distress  Cervical Spine Area Exam  Skin & Axial Inspection: No masses, redness, edema, swelling,  or associated skin lesions Alignment: Symmetrical Functional ROM: Decreased  ROM      Stability: No instability detected Muscle Tone/Strength: Functionally intact. No obvious neuro-muscular anomalies detected. Sensory (Neurological): Dermatomal pain pattern Palpation: Complains of area being tender to palpation             5 out of 5 strength bilateral upper extremity: Shoulder abduction, elbow flexion, elbow extension, thumb extension.  Upper Extremity (UE) Exam    Side: Right upper extremity  Side: Left upper extremity  Skin & Extremity Inspection: Skin color, temperature, and hair growth are WNL. No peripheral edema or cyanosis. No masses, redness, swelling, asymmetry, or associated skin lesions. No contractures.  Skin & Extremity Inspection: Skin color, temperature, and hair growth are WNL. No peripheral edema or cyanosis. No masses, redness, swelling, asymmetry, or associated skin lesions. No contractures.  Functional ROM: Unrestricted ROM          Functional ROM: Unrestricted ROM          Muscle Tone/Strength: Functionally intact. No obvious neuro-muscular anomalies detected.  Muscle Tone/Strength: Functionally intact. No obvious neuro-muscular anomalies detected.  Sensory (Neurological): Unimpaired          Sensory (Neurological): Unimpaired          Palpation: No palpable anomalies              Palpation: No palpable anomalies              Specialized Test(s): Deferred         Specialized Test(s): Deferred          Thoracic Spine Area Exam  Skin & Axial Inspection: No masses, redness, or swelling Alignment: Symmetrical Functional ROM: Unrestricted ROM Stability: No instability detected Muscle Tone/Strength: Functionally intact. No obvious neuro-muscular anomalies detected. Sensory (Neurological): Unimpaired Muscle strength & Tone: No palpable anomalies  Lumbar Spine Area Exam  Skin & Axial Inspection: No masses, redness, or swelling Alignment: Symmetrical Functional ROM:  Unrestricted ROM      Stability: No instability detected Muscle Tone/Strength: Functionally intact. No obvious neuro-muscular anomalies detected. Sensory (Neurological): Unimpaired Palpation: No palpable anomalies       Provocative Tests: Lumbar Hyperextension and rotation test: evaluation deferred today       Lumbar Lateral bending test: evaluation deferred today       Patrick's Maneuver: evaluation deferred today                    Gait & Posture Assessment  Ambulation: Unassisted Gait: Relatively normal for age and body habitus Posture: WNL   Lower Extremity Exam    Side: Right lower extremity  Side: Left lower extremity  Skin & Extremity Inspection: Skin color, temperature, and hair growth are WNL. No peripheral edema or cyanosis. No masses, redness, swelling, asymmetry, or associated skin lesions. No contractures.  Skin & Extremity Inspection: Skin color, temperature, and hair growth are WNL. No peripheral edema or cyanosis. No masses, redness, swelling, asymmetry, or associated skin lesions. No contractures.  Functional ROM: Unrestricted ROM          Functional ROM: Unrestricted ROM          Muscle Tone/Strength: Functionally intact. No obvious neuro-muscular anomalies detected.  Muscle Tone/Strength: Functionally intact. No obvious neuro-muscular anomalies detected.  Sensory (Neurological): Unimpaired  Sensory (Neurological): Unimpaired  Palpation: No palpable anomalies  Palpation: No palpable anomalies   Assessment  Primary Diagnosis & Pertinent Problem List: The primary encounter diagnosis was Spondylosis of cervical region without myelopathy or radiculopathy. Diagnoses of Osteoarthritis of spine with radiculopathy, cervical region,  Chronic pain syndrome, and Neck pain were also pertinent to this visit.  Status Diagnosis  Persistent Persistent Persistent 1. Spondylosis of cervical region without myelopathy or radiculopathy   2. Osteoarthritis of spine with radiculopathy,  cervical region   3. Chronic pain syndrome   4. Neck pain      General Recommendations: The pain condition that the patient suffers from is best treated with a multidisciplinary approach that involves an increase in physical activity to prevent de-conditioning and worsening of the pain cycle, as well as psychological counseling (formal and/or informal) to address the co-morbid psychological affects of pain. Treatment will often involve judicious use of pain medications and interventional procedures to decrease the pain, allowing the patient to participate in the physical activity that will ultimately produce long-lasting pain reductions. The goal of the multidisciplinary approach is to return the patient to a higher level of overall function and to restore their ability to perform activities of daily living.   52 year old female with a history of neck pain that radiates to bilateral hands secondary to cervical degenerative disc disease, cervical radiculopathy, cervical spondylosis.  Patient presents today for medication refill.  Given hypertension with increased dose of Cymbalta, I have instructed patient to decrease her dose back to 30 mg.  We will also prescribe her a lidocaine 5% patch that she can apply to her neck region.  Given that the patient is on time oral analgesics with NSAID therapy, membrane stabilizer, topical and is still continuing to endorse significant neck neck and arm pain, she is requesting to increase her Percocet 10 mg up to 4 times a day as needed for severe pain.  Will increase Percocet to 10 mg 3 times a day as needed for severe pain, quantity 30-month  No further dose escalation beyond this.  I told the patient that if she develops any upper extremity weakness or severe upper extremity pain that is very different from her baseline, to contact me and that we may need to consider doing an urgent cervical MRI at that time.  We may consider getting this at the next visit depending  upon how her symptoms progress.  Plan: -Increase oxycodone from 10 mg twice daily as needed, quantity 634-montho 10 mg 3 times daily as needed, quantity 9054-monthNo further dose escalation beyond this. -Decrease Cymbalta to 30 mg by mouth at bedtime given increased side effects of hypertension at a dose of 60 mg. -Prescription for lidocaine patch -Future considerations: Cervical MRI, cervical ESI -Previous interventions with me: Bilateral C5-C7 facet medial branch nerve block   Plan of Care  Pharmacotherapy (Medications Ordered): Meds ordered this encounter  Medications  . lidocaine (LIDODERM) 5 %    Sig: Place 1 patch onto the skin daily. Remove & Discard patch within 12 hours or as directed by MD    Dispense:  30 patch    Refill:  0  . DISCONTD: oxyCODONE-acetaminophen (PERCOCET) 10-325 MG tablet    Sig: Take 1 tablet by mouth every 8 (eight) hours as needed for pain. To be filled on or after: 02/09/17, 03/09/17    Dispense:  90 tablet    Refill:  0  . DULoxetine (CYMBALTA) 30 MG capsule    Sig: Take 1 capsule (30 mg total) by mouth at bedtime.    Dispense:  60 capsule    Refill:  1  . oxyCODONE-acetaminophen (PERCOCET) 10-325 MG tablet    Sig: Take 1 tablet by mouth every 8 (eight) hours as needed for  pain. To be filled on or after: 02/09/17, 03/09/17    Dispense:  90 tablet    Refill:  0   Time Note: Greater than 50% of the 25 minute(s) of face-to-face time spent with Ms. Walla, was spent in counseling/coordination of care regarding: opioid tolerance, "Drug Holidays", the treatment plan, the risks and possible complications of proposed treatment, the opioid analgesic risks and possible complications, the appropriate use of her medications, realistic expectations and the medication agreement. Provider-requested follow-up: Return in about 8 weeks (around 03/29/2017) for Medication Management.  Future Appointments  Date Time Provider [place]  03/15/2017  8:00 AM [facility]  [facility] None  03/29/2017  8:45 AM Gillis Santa, MD [facility] None    Primary Care Physician: Jearld Fenton, NP Location: [facility] Out[facility] Management Facility Note by: Gillis Santa, M.D Date: 02/01/2017; Time: 9:06 AM  There are no Patient Instructions on file for this visit.

## 2017-02-19 ENCOUNTER — Telehealth: Payer: Self-pay | Admitting: *Deleted

## 2017-02-19 NOTE — Telephone Encounter (Signed)
Patient believes Cymbalta causing BP and HR to rise. Stopped taking it yesterday. May go to ED. Also having pain in both elbows, also may be causing elevated BP and HR. Requesting injections in elbows.

## 2017-02-20 ENCOUNTER — Encounter: Payer: Self-pay | Admitting: Student in an Organized Health Care Education/Training Program

## 2017-02-20 ENCOUNTER — Ambulatory Visit
Payer: 59 | Attending: Student in an Organized Health Care Education/Training Program | Admitting: Student in an Organized Health Care Education/Training Program

## 2017-02-20 ENCOUNTER — Other Ambulatory Visit: Payer: Self-pay

## 2017-02-20 VITALS — BP 144/89 | HR 96 | Resp 16 | Ht 59.0 in | Wt 120.0 lb

## 2017-02-20 DIAGNOSIS — M7918 Myalgia, other site: Secondary | ICD-10-CM | POA: Insufficient documentation

## 2017-02-20 DIAGNOSIS — Z8049 Family history of malignant neoplasm of other genital organs: Secondary | ICD-10-CM | POA: Insufficient documentation

## 2017-02-20 DIAGNOSIS — Z807 Family history of other malignant neoplasms of lymphoid, hematopoietic and related tissues: Secondary | ICD-10-CM | POA: Insufficient documentation

## 2017-02-20 DIAGNOSIS — Z9889 Other specified postprocedural states: Secondary | ICD-10-CM | POA: Diagnosis not present

## 2017-02-20 DIAGNOSIS — M542 Cervicalgia: Secondary | ICD-10-CM | POA: Diagnosis not present

## 2017-02-20 DIAGNOSIS — Z79899 Other long term (current) drug therapy: Secondary | ICD-10-CM | POA: Diagnosis not present

## 2017-02-20 DIAGNOSIS — G8929 Other chronic pain: Secondary | ICD-10-CM | POA: Insufficient documentation

## 2017-02-20 DIAGNOSIS — G894 Chronic pain syndrome: Secondary | ICD-10-CM | POA: Insufficient documentation

## 2017-02-20 DIAGNOSIS — F17200 Nicotine dependence, unspecified, uncomplicated: Secondary | ICD-10-CM | POA: Insufficient documentation

## 2017-02-20 DIAGNOSIS — G5603 Carpal tunnel syndrome, bilateral upper limbs: Secondary | ICD-10-CM | POA: Insufficient documentation

## 2017-02-20 DIAGNOSIS — Z811 Family history of alcohol abuse and dependence: Secondary | ICD-10-CM | POA: Insufficient documentation

## 2017-02-20 DIAGNOSIS — M25522 Pain in left elbow: Secondary | ICD-10-CM | POA: Insufficient documentation

## 2017-02-20 DIAGNOSIS — M25521 Pain in right elbow: Secondary | ICD-10-CM | POA: Insufficient documentation

## 2017-02-20 MED ORDER — CELECOXIB 200 MG PO CAPS: 200.0000 mg | ORAL_CAPSULE | Freq: Every day | ORAL | 0 refills | Status: DC

## 2017-02-20 NOTE — Progress Notes (Signed)
Patient's Name: Tammy Lang  MRN: [id number]  Referring Provider: Jearld Fenton, NP  DOB: January 17, 1965  PCP: Jearld Fenton, NP  DOS: 02/20/2017  Note by: Gillis Santa, MD  Service setting: Ambulatory outpatient  Specialty: Interventional Pain Management  Location: [facility] (AMB) Pain Management Facility    Patient type: Established   Primary Reason(s) for Visit: Encounter for prescription drug management. (Level of risk: moderate)  CC: Neck Pain (base) and Elbow Pain (left)  HPI  Tammy Lang is a 52 y.o. year old, female patient, who comes today for a medication management evaluation. She has Osteoarthritis of spine with radiculopathy, cervical region; Seasonal allergies; Chronic pain syndrome; Bilateral carpal tunnel syndrome; Elbow pain, chronic, left; and Myofascial pain on their problem list. Her primarily concern today is the Neck Pain (base) and Elbow Pain (left)  Pain Assessment: Location:   Neck Radiating: radiates down both arms to the elbows, left is worse Onset: More than a month ago Duration: Chronic pain Quality: Aching, Burning, Crushing, Discomfort Severity: 10-Worst pain ever/10 (self-reported pain score)  Note: Reported level is inconsistent with clinical observations. Clinically the patient looks like a 3/10 A 3/10 is viewed as "Moderate" and described as significantly interfering with activities of daily living (ADL). It becomes difficult to feed, bathe, get dressed, get on and off the toilet or to perform personal hygiene functions. Difficult to get in and out of bed or a chair without assistance. Very distracting. With effort, it can be ignored when deeply involved in activities.       When using our objective Pain Scale, levels between 6 and 10/10 are said to belong in an emergency room, as it progressively worsens from a 6/10, described as severely limiting, requiring emergency care not usually available at an outpatient pain management facility. At a 6/10 level,  communication becomes difficult and requires great effort. Assistance to reach the emergency department may be required. Facial flushing and profuse sweating along with potentially dangerous increases in heart rate and blood pressure will be evident. Effect on ADL: hard to  lift anything, bend it Timing: Constant Modifying factors: nothing currently  Tammy Lang was last scheduled for an appointment on 02/01/2017 for medication management. During today's appointment we reviewed Tammy Lang's chronic pain status, as well as her outpatient medication regimen.  52 year old female with a history of neck pain that radiates to bilateral hands secondary to cervical degenerative disc disease, cervical radiculopathy, cervical spondylosis.  Patient presents today with acute exacerbation of bilateral elbow pain, left greater than right.  Patient complains of a very focal spot medial to her olecranon process that is exquisitely tender and painful to touch which radiates pain as well when palpated.  Additionally, patient has noted high blood pressures with Cymbalta.  She has decreased her dose and most recently has stopped it since her systolics were in the 440N.  She is currently on oxycodone 10 mg 3 times daily as needed, quantity 38-month tizanidine as needed.  No further dose escalation beyond oxycodone 10 mg 3 times daily as needed.   The patient  reports that she does not use drugs. Her body mass index is 24.24 kg/m.  Further details on both, my assessment(s), as well as the proposed treatment plan, please see below.  Controlled Substance Pharmacotherapy Assessment REMS (Risk Evaluation and Mitigation Strategy)  Analgesic: Oxycodone 10 mg 3 times daily as needed, quantity 90 MME/day: Approximately 45 mg/day.  GIgnatius Specking RN  02/20/2017  8:00 AM  Sign at close  encounter Safety precautions to be maintained throughout the outpatient stay will include: orient to surroundings, keep bed in low  position, maintain call bell within reach at all times, provide assistance with transfer out of bed and ambulation.    Pharmacokinetics: Liberation and absorption (onset of action): WNL Distribution (time to peak effect): WNL Metabolism and excretion (duration of action): WNL         Pharmacodynamics: Desired effects: Analgesia: Tammy Lang reports >50% benefit. Functional ability: Patient reports that medication allows her to accomplish basic ADLs Clinically meaningful improvement in function (CMIF): Sustained CMIF goals met Perceived effectiveness: Described as relatively effective, allowing for increase in activities of daily living (ADL) Undesirable effects: Side-effects or Adverse reactions: None reported Monitoring: [place] PMP: Online review of the past 35-monthperiod conducted. Compliant with practice rules and regulations Last UDS on record: Summary  Date Value Ref Range Status  10/17/2016 FINAL  Final    Comment:    ==================================================================== [facility] COMP DRUG ANALYSIS,UR ==================================================================== Test                             Result       Flag       Units Drug Present and Declared for Prescription Verification   Oxycodone                      470          EXPECTED   ng/mg creat   Oxymorphone                    750          EXPECTED   ng/mg creat   Noroxycodone                   1621         EXPECTED   ng/mg creat   Noroxymorphone                 257          EXPECTED   ng/mg creat    Sources of oxycodone are scheduled prescription medications.    Oxymorphone, noroxycodone, and noroxymorphone are expected    metabolites of oxycodone. Oxymorphone is also available as a    scheduled prescription medication.   Baclofen                       PRESENT      EXPECTED   Acetaminophen                  PRESENT      EXPECTED   Diphenhydramine                PRESENT      EXPECTED Drug Present not  Declared for Prescription Verification   Gabapentin                     PRESENT      UNEXPECTED   Trazodone                      PRESENT      UNEXPECTED   1,3 chlorophenyl piperazine    PRESENT      UNEXPECTED    1,3-chlorophenyl piperazine is an expected metabolite of    trazodone.   Naproxen  PRESENT      UNEXPECTED Drug Absent but Declared for Prescription Verification   Duloxetine                     Not Detected UNEXPECTED   Diclofenac                     Not Detected UNEXPECTED    Diclofenac, as indicated in the declared medication list, is not    always detected even when used as directed.   Lidocaine                      Not Detected UNEXPECTED    Lidocaine, as indicated in the declared medication list, is not    always detected even when used as directed. ==================================================================== Test                      Result    Flag   Units      Ref Range   Creatinine              159              mg/dL      >=20 ==================================================================== Declared Medications:  The flagging and interpretation on this report are based on the  following declared medications.  Unexpected results may arise from  inaccuracies in the declared medications.  **Note: The testing scope of this panel includes these medications:  Baclofen (Lioresal)  Diphenhydramine (Benadryl)  Duloxetine (Cymbalta)  Oxycodone (Percocet)  **Note: The testing scope of this panel does not include small to  moderate amounts of these reported medications:  Acetaminophen (Percocet)  Diclofenac (Voltaren)  Lidocaine (Xylocaine)  **Note: The testing scope of this panel does not include following  reported medications:  Topical ==================================================================== For clinical consultation, please call [phone]. ====================================================================    UDS  interpretation: Compliant          Medication Assessment Form: Reviewed. Patient indicates being compliant with therapy Treatment compliance: Compliant Risk Assessment Profile: Aberrant behavior: See prior evaluations. None observed or detected today Comorbid factors increasing risk of overdose: See prior notes. No additional risks detected today Risk of substance use disorder (SUD): Low Opioid Risk Tool - 02/20/17 0807      Family History of Substance Abuse   Alcohol  Positive Female    Illegal Drugs  Negative    Rx Drugs  Negative      Personal History of Substance Abuse   Alcohol  Negative    Illegal Drugs  Negative    Rx Drugs  Negative      Total Score   Opioid Risk Tool Scoring  1    Opioid Risk Interpretation  Low Risk      ORT Scoring interpretation table:  Score <3 = Low Risk for SUD  Score between 4-7 = Moderate Risk for SUD  Score >8 = High Risk for Opioid Abuse   Risk Mitigation Strategies:  Patient Counseling: Covered Patient-Prescriber Agreement (PPA): Present and active  Notification to other healthcare providers: Done  Pharmacologic Plan: No change in therapy, at this time.             Laboratory Chemistry  Inflammation Markers (CRP: Acute Phase) (ESR: Chronic Phase) No results found for: CRP, ESRSEDRATE, LATICACIDVEN               Rheumatology Markers No results found for: RF, ANA, LABURIC, URICUR, LYMEIGGIGMAB, LYMEABIGMQN  Renal Function Markers No results found for: BUN, CREATININE, GFRAA, GFRNONAA               Hepatic Function Markers No results found for: AST, ALT, ALBUMIN, ALKPHOS, HCVAB, AMYLASE, LIPASE, AMMONIA               Electrolytes No results found for: NA, K, CL, CALCIUM, MG, PHOS               Neuropathy Markers No results found for: VITAMINB12, FOLATE, HGBA1C, HIV               Bone Pathology Markers No results found for: VD25OH, [id number], [id number], [id number], 25OHVITD1, 25OHVITD2, 25OHVITD3, TESTOFREE,  TESTOSTERONE               Coagulation Parameters No results found for: INR, LABPROT, APTT, PLT, DDIMER               Cardiovascular Markers No results found for: BNP, CKTOTAL, CKMB, TROPONINI, HGB, HCT               CA Markers No results found for: CEA, CA125, LABCA2               Note: Lab results reviewed.  Recent Diagnostic Imaging Results  [place] CHEST SOFT TISSUE CLINICAL DATA:  Probable layering of the left lateral chest for 2 weeks.  EXAM: ULTRASOUND OF HEAD/NECK SOFT TISSUES  TECHNIQUE: Ultrasound examination of the head and neck soft tissues was performed in the area of clinical concern.  COMPARISON:  None.  FINDINGS: Limited ultrasound images obtained in the left lateral chest over the area of palpable abnormality demonstrate an ovoid circumscribed nodule in the subcutaneous tissues. 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 The lesion measures 3.3 x 0.9 x 4.5 cm. No significant flow is demonstrated on color flow Doppler imaging. No fluid collections.  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 This corresponds to the palpable abnormality.  Electronically Signed   By: Lucienne Capers M.D.   On: 01/10/2017 18:39  Complexity Note: Imaging results reviewed. Results shared with Tammy Lang, using Layman's terms.                         Meds   Current Outpatient Medications:  .  clotrimazole-betamethasone (LOTRISONE) cream, Apply 1 application topically 2 (two) times daily. For up to 2 weeks, Disp: 45 g, Rfl: 0 .  diclofenac sodium (VOLTAREN) 1 % GEL, Apply 2 g topically 4 (four) times daily., Disp: 1 Tube, Rfl: 2 .  diphenhydrAMINE (BENADRYL) 25 MG tablet, Take 25 mg by mouth every 6 (six) hours as needed., Disp: , Rfl:  .  etodolac (LODINE) 200 MG capsule, Take 1 capsule (200 mg total) by mouth 3 (three) times daily., Disp: 90 capsule, Rfl: 0 .   lidocaine (LIDODERM) 5 %, Place 1 patch onto the skin daily. Remove & Discard patch within 12 hours or as directed by MD, Disp: 30 patch, Rfl: 0 .  oxyCODONE-acetaminophen (PERCOCET) 10-325 MG tablet, Take 1 tablet by mouth every 8 (eight) hours as needed for pain. To be filled on or after: 02/09/17, 03/09/17, Disp: 90 tablet, Rfl: 0 .  tiZANidine (ZANAFLEX) 4 MG tablet, Take 1 tablet (4 mg total) by mouth 3 (three) times daily., Disp: 90 tablet, Rfl: 1 .  celecoxib (CELEBREX) 200 MG capsule, Take 1 capsule (200 mg total)  by mouth daily., Disp: 30 capsule, Rfl: 0  ROS  Constitutional: Denies any fever or chills Gastrointestinal: No reported hemesis, hematochezia, vomiting, or acute GI distress Musculoskeletal: Denies any acute onset joint swelling, redness, loss of ROM, or weakness Neurological: No reported episodes of acute onset apraxia, aphasia, dysarthria, agnosia, amnesia, paralysis, loss of coordination, or loss of consciousness  Allergies  Tammy Lang has No Known Allergies.  PFSH  Drug: Tammy Lang  reports that she does not use drugs. Alcohol:  reports that she drinks alcohol. Tobacco:  reports that she has been smoking cigarettes.  She has a 15.00 pack-year smoking history. she has never used smokeless tobacco. Medical:  has a past medical history of Allergy, Arthritis, and Spinal stenosis of cervical region. Surgical: Tammy Lang  has a past surgical history that includes Cesarean section; Bunionectomy; and Ganglion cyst excision. Family: family history includes Alcohol abuse in her mother; Cervical cancer in her mother; Hodgkin's lymphoma in her sister.  Constitutional Exam  General appearance: Well nourished, well developed, and well hydrated. In no apparent acute distress Vitals:   02/20/17 0800  BP: (!) 144/89  Pulse: 96  Resp: 16  Weight: 120 lb (54.4 kg)  Height: _0  (1.499 m)   BMI Assessment: Estimated body mass index is 24.24 kg/m as calculated from the  following:   Height as of this encounter: _1  (1.499 m).   Weight as of this encounter: 120 lb (54.4 kg).  BMI interpretation table: BMI level Category Range association with higher incidence of chronic pain  <18 kg/m2 Underweight   18.5-24.9 kg/m2 Ideal body weight   25-29.9 kg/m2 Overweight Increased incidence by 20%  30-34.9 kg/m2 Obese (Class I) Increased incidence by 68%  35-39.9 kg/m2 Severe obesity (Class II) Increased incidence by 136%  >40 kg/m2 Extreme obesity (Class III) Increased incidence by 254%   BMI Readings from Last 4 Encounters:  02/20/17 24.24 kg/m  02/01/17 24.24 kg/m  12/27/16 24.44 kg/m  12/15/16 24.14 kg/m   Wt Readings from Last 4 Encounters:  02/20/17 120 lb (54.4 kg)  02/01/17 120 lb (54.4 kg)  12/27/16 121 lb (54.9 kg)  12/15/16 119 lb 8 oz (54.2 kg)  Psych/Mental status: Alert, oriented x 3 (person, place, & time)       Eyes: PERLA Respiratory: No evidence of acute respiratory distress Cervical Spine Area Exam  Skin & Axial Inspection: No masses, redness, edema, swelling, or associated skin lesions Alignment: Symmetrical Functional ROM: Decreased ROM      Stability: No instability detected Muscle Tone/Strength: Functionally intact. No obvious neuro-muscular anomalies detected. Sensory (Neurological): Dermatomal pain pattern Palpation: Complains of area being tender to palpation             5 out of 5 strength bilateral upper extremity: Shoulder abduction, elbow flexion, elbow extension, thumb extension.         Upper Extremity (UE) Exam    Side: Right upper extremity  Side: Left upper extremity   Skin & Extremity Inspection: Skin color, temperature, and hair growth are WNL. No peripheral edema or cyanosis. No masses, redness, swelling, asymmetry, or associated skin lesions. No contractures.  Skin & Extremity Inspection: Skin color, temperature, and hair growth are WNL. No peripheral edema or cyanosis. No masses, redness, swelling,  asymmetry, or associated skin lesions. No contractures.   Functional ROM: Unrestricted ROM          Functional ROM: Unrestricted ROM           Muscle Tone/Strength: Functionally  intact. No obvious neuro-muscular anomalies detected.  Muscle Tone/Strength: Functionally intact. No obvious neuro-muscular anomalies detected.   Sensory (Neurological): Unimpaired          Sensory (Neurological): Unimpaired           Palpation: No palpable anomalies              Palpation: No palpable anomalies               Specialized Test(s): Deferred         Specialized Test(s): Deferred            Thoracic Spine Area Exam  Skin & Axial Inspection: No masses, redness, or swelling Alignment: Symmetrical Functional ROM: Unrestricted ROM Stability: No instability detected Muscle Tone/Strength: Functionally intact. No obvious neuro-muscular anomalies detected. Sensory (Neurological): Unimpaired Muscle strength & Tone: No palpable anomalies  Lumbar Spine Area Exam  Skin & Axial Inspection: No masses, redness, or swelling Alignment: Symmetrical Functional ROM: Unrestricted ROM      Stability: No instability detected Muscle Tone/Strength: Functionally intact. No obvious neuro-muscular anomalies detected. Sensory (Neurological): Unimpaired Palpation: No palpable anomalies       Provocative Tests: Lumbar Hyperextension and rotation test: evaluation deferred today       Lumbar Lateral bending test: evaluation deferred today       Patrick's Maneuver: evaluation deferred today                    Gait & Posture Assessment  Ambulation: Unassisted Gait: Relatively normal for age and body habitus Posture: WNL   Lower Extremity Exam    Side: Right lower extremity  Side: Left lower extremity  Skin & Extremity Inspection: Skin color, temperature, and hair growth are WNL. No peripheral edema or cyanosis. No masses, redness, swelling, asymmetry, or associated skin lesions. No contractures.  Skin & Extremity  Inspection: Skin color, temperature, and hair growth are WNL. No peripheral edema or cyanosis. No masses, redness, swelling, asymmetry, or associated skin lesions. No contractures.  Functional ROM: Unrestricted ROM          Functional ROM: Unrestricted ROM          Muscle Tone/Strength: Functionally intact. No obvious neuro-muscular anomalies detected.  Muscle Tone/Strength: Functionally intact. No obvious neuro-muscular anomalies detected.  Sensory (Neurological): Unimpaired  Sensory (Neurological): Unimpaired  Palpation: No palpable anomalies  Palpation: No palpable anomalies   Assessment  Primary Diagnosis & Pertinent Problem List: The primary encounter diagnosis was Elbow pain, chronic, left. Diagnoses of Myofascial pain, Chronic pain syndrome, Cervicalgia, Bilateral carpal tunnel syndrome, and Chronic pain of right elbow were also pertinent to this visit.  Status Diagnosis  Controlled Controlled Controlled 1. Elbow pain, chronic, left   2. Myofascial pain   3. Chronic pain syndrome   4. Cervicalgia   5. Bilateral carpal tunnel syndrome   6. Chronic pain of right elbow      General Recommendations: The pain condition that the patient suffers from is best treated with a multidisciplinary approach that involves an increase in physical activity to prevent de-conditioning and worsening of the pain cycle, as well as psychological counseling (formal and/or informal) to address the co-morbid psychological affects of pain. Treatment will often involve judicious use of pain medications and interventional procedures to decrease the pain, allowing the patient to participate in the physical activity that will ultimately produce long-lasting pain reductions. The goal of the multidisciplinary approach is to return the patient to a higher level of overall function and to restore  their ability to perform activities of daily living.   52 year old female with a history of neck pain that radiates to  bilateral hands secondary to cervical degenerative disc disease, cervical radiculopathy, cervical spondylosis.   Patient presents today with acute exacerbation of bilateral elbow pain, left greater than right.  Patient complains of a very focal spot medial to her olecranon process that is exquisitely tender and painful to touch which radiates pain as well when palpated.  Additionally, patient has noted high blood pressures with Cymbalta.  She has decreased her dose and most recently has stopped it since her systolics were in the 893Y.  She is currently on oxycodone 10 mg 3 times daily as needed, quantity 3-month tizanidine as needed.  No further dose escalation beyond oxycodone 10 mg 3 times daily as needed.  Patient's elbow pain could be myofascial in nature and could be a trigger point since it radiates pain out.  She has had [place] trigger [place] were effective.  She is requesting to have these repeated.  Risks and benefits were discussed.  We will also prescribe the patient Celebrex 200 mg daily to take for acute pain exacerbation.  I recommended the patient take this after meal to minimize risk of GI upset.  Plan: -b/l elbow trigger point injections for myofascial pain -short course of Celebrex of acute on chronic elbow pain -continue oxycodone 10 mg 3 times daily as needed as prescribed.  No further dose escalation. -Continue tizanidine as prescribed.   -Future considerations: Cervical MRI, cervical ESI -Previous interventions with me: Bilateral C5-C7 facet medial branch nerve block   Plan of Care  Pharmacotherapy (Medications Ordered): Meds ordered this encounter  Medications  . celecoxib (CELEBREX) 200 MG capsule    Sig: Take 1 capsule (200 mg total) by mouth daily.    Dispense:  30 capsule    Refill:  0    Do not place medication on "Automatic Refill". Fill one day early if pharmacy is closed on scheduled refill date.    Lab-work, procedure(s), and/or referral(s): Orders Placed This Encounter  Procedures  . TRIGGER POINT INJECTION   Time Note: Greater than 50% of the 15 minute(s) of face-to-face time spent with Tammy Lang, was spent in counseling/coordination of care regarding: the treatment plan, treatment alternatives, the risks and possible complications of proposed treatment, medication side effects, going over the informed consent and realistic expectations.  Provider-requested follow-up: Return in about 2 weeks (around 03/06/2017) for Procedure.  Future Appointments  Date Time Provider [place] 03/02/2017  8:30 AM PEvelina Bucy DPM [facility]  03/15/2017  8:00 AM [facility] None  03/29/2017  8:45 AM LGillis Santa MD [facility] None    Primary Care Physician: BJearld Fenton NP [facility] Note by: BGillis Santa M.D Date: 02/20/2017; Time: 8:34 AM  Patient Instructions   GENERAL RISKS AND COMPLICATIONS  What are the risk, side effects and possible complications? Generally speaking, most procedures are safe.  However, with any procedure there are risks, side effects, and the possibility of complications.  The risks and complications are dependent upon the sites that are lesioned, or the type of nerve block to be performed.  The closer the procedure is to the spine, the more serious the risks are.  Great care is taken when placing the radio frequency needles, block needles or lesioning probes, but sometimes complications can occur. 1. Infection: Any time there  is an injection through the skin, there is a risk of infection.  This is why sterile conditions are used for these blocks.  There are four possible types of infection. 1. Localized skin infection. 2. Central Nervous System Infection-This can be in the form of Meningitis, which can be deadly. 3. Epidural Infections-This can be in the form of an epidural abscess, which  can cause pressure inside of the spine, causing compression of the spinal cord with subsequent paralysis. This would require an emergency surgery to decompress, and there are no guarantees that the patient would recover from the paralysis. 4. Discitis-This is an infection of the intervertebral discs.  It occurs in about 1% of discography procedures.  It is difficult to treat and it may lead to surgery.        2. Pain: the needles have to go through skin and soft tissues, will cause soreness.       3. Damage to internal structures:  The nerves to be lesioned may be near blood vessels or    other nerves which can be potentially damaged.       4. Bleeding: Bleeding is more common if the patient is taking blood thinners such as  aspirin, Coumadin, Ticiid, Plavix, etc., or if he/she have some genetic predisposition  such as hemophilia. Bleeding into the spinal canal can cause compression of the spinal  cord with subsequent paralysis.  This would require an emergency surgery to  decompress and there are no guarantees that the patient would recover from the  paralysis.       5. Pneumothorax:  Puncturing of a lung is a possibility, every time a needle is introduced in  the area of the chest or upper back.  Pneumothorax refers to free air around the  collapsed lung(s), inside of the thoracic cavity (chest cavity).  Another two possible  complications related to a similar event would include: Hemothorax and Chylothorax.   These are variations of the Pneumothorax, where instead of air around the collapsed  lung(s), you may have blood or chyle, respectively.       6. Spinal headaches: They may occur with any procedures in the area of the spine.       7. Persistent CSF (Cerebro-Spinal Fluid) leakage: This is a rare problem, but may occur  with prolonged intrathecal or epidural catheters either due to the formation of a fistulous  track or a dural tear.       8. Nerve damage: By working so close to the spinal cord,  there is always a possibility of  nerve damage, which could be as serious as a permanent spinal cord injury with  paralysis.       9. Death:  Although rare, severe deadly allergic reactions known as "Anaphylactic  reaction" can occur to any of the medications used.      10. Worsening of the symptoms:  We can always make thing worse.  What are the chances of something like this happening? Chances of any of this occuring are extremely low.  By statistics, you have more of a chance of getting killed in a motor vehicle accident: while driving to the hospital than any of the above occurring .  Nevertheless, you should be aware that they are possibilities.  In general, it is similar to taking a shower.  Everybody knows that you can slip, hit your head and get killed.  Does that mean that you should not shower again?  Nevertheless always keep in mind  that statistics do not mean anything if you happen to be on the wrong side of them.  Even if a procedure has a 1 (one) in a 1,000,000 (million) chance of going wrong, it you happen to be that one..Also, keep in mind that by statistics, you have more of a chance of having something go wrong when taking medications.  Who should not have this procedure? If you are on a blood thinning medication (e.g. Coumadin, Plavix, see list of "Blood Thinners"), or if you have an active infection going on, you should not have the procedure.  If you are taking any blood thinners, please inform your physician.  How should I prepare for this procedure?  Do not eat or drink anything at least six hours prior to the procedure.  Bring a driver with you .  It cannot be a taxi.  Come accompanied by an adult that can drive you back, and that is strong enough to help you if your legs get weak or numb from the local anesthetic.  Take all of your medicines the morning of the procedure with just enough water to swallow them.  If you have diabetes, make sure that you are scheduled to have  your procedure done first thing in the morning, whenever possible.  If you have diabetes, take only half of your insulin dose and notify our nurse that you have done so as soon as you arrive at the clinic.  If you are diabetic, but only take blood sugar pills (oral hypoglycemic), then do not take them on the morning of your procedure.  You may take them after you have had the procedure.  Do not take aspirin or any aspirin-containing medications, at least eleven (11) days prior to the procedure.  They may prolong bleeding.  Wear loose fitting clothing that may be easy to take off and that you would not mind if it got stained with Betadine or blood.  Do not wear any jewelry or perfume  Remove any nail coloring.  It will interfere with some of our monitoring equipment.  NOTE: Remember that this is not meant to be interpreted as a complete list of all possible complications.  Unforeseen problems may occur.  BLOOD THINNERS The following drugs contain aspirin or other products, which can cause increased bleeding during surgery and should not be taken for 2 weeks prior to and 1 week after surgery.  If you should need take something for relief of minor pain, you may take acetaminophen which is found in Tylenol,m Datril, Anacin-3 and Panadol. It is not blood thinner. The products listed below are.  Do not take any of the products listed below in addition to any listed on your instruction sheet.  A.P.C or A.P.C with Codeine Codeine Phosphate Capsules #3 Ibuprofen Ridaura  ABC compound Congesprin Imuran rimadil  Advil Cope Indocin Robaxisal  Alka-Seltzer Effervescent Pain Reliever and Antacid Coricidin or Coricidin-D  Indomethacin Rufen  Alka-Seltzer plus Cold Medicine Cosprin Ketoprofen S-A-C Tablets  Anacin Analgesic Tablets or Capsules Coumadin Korlgesic Salflex  Anacin Extra Strength Analgesic tablets or capsules CP-2 Tablets Lanoril Salicylate  Anaprox Cuprimine Capsules Levenox Salocol   Anexsia-D Dalteparin Magan Salsalate  Anodynos Darvon compound Magnesium Salicylate Sine-off  Ansaid Dasin Capsules Magsal Sodium Salicylate  Anturane Depen Capsules Marnal Soma  APF Arthritis pain formula Dewitt's Pills Measurin Stanback  Argesic Dia-Gesic Meclofenamic Sulfinpyrazone  Arthritis Bayer Timed Release Aspirin Diclofenac Meclomen Sulindac  Arthritis pain formula Anacin Dicumarol Medipren Supac  Analgesic (Safety coated) Arthralgen  Diffunasal Mefanamic Suprofen  Arthritis Strength Bufferin Dihydrocodeine Mepro Compound Suprol  Arthropan liquid Dopirydamole Methcarbomol with Aspirin Synalgos  ASA tablets/Enseals Disalcid Micrainin Tagament  Ascriptin Doan's Midol Talwin  Ascriptin A/D Dolene Mobidin Tanderil  Ascriptin Extra Strength Dolobid Moblgesic Ticlid  Ascriptin with Codeine Doloprin or Doloprin with Codeine Momentum Tolectin  Asperbuf Duoprin Mono-gesic Trendar  Aspergum Duradyne Motrin or Motrin IB Triminicin  Aspirin plain, buffered or enteric coated Durasal Myochrisine Trigesic  Aspirin Suppositories Easprin Nalfon Trillsate  Aspirin with Codeine Ecotrin Regular or Extra Strength Naprosyn Uracel  Atromid-S Efficin Naproxen Ursinus  Auranofin Capsules Elmiron Neocylate Vanquish  Axotal Emagrin Norgesic Verin  Azathioprine Empirin or Empirin with Codeine Normiflo Vitamin E  Azolid Emprazil Nuprin Voltaren  Bayer Aspirin plain, buffered or children's or timed BC Tablets or powders Encaprin Orgaran Warfarin Sodium  Buff-a-Comp Enoxaparin Orudis Zorpin  Buff-a-Comp with Codeine Equegesic Os-Cal-Gesic   Buffaprin Excedrin plain, buffered or Extra Strength Oxalid   Bufferin Arthritis Strength Feldene Oxphenbutazone   Bufferin plain or Extra Strength Feldene Capsules Oxycodone with Aspirin   Bufferin with Codeine Fenoprofen Fenoprofen Pabalate or Pabalate-SF   Buffets II Flogesic Panagesic   Buffinol plain or Extra Strength Florinal or Florinal with Codeine  Panwarfarin   Buf-Tabs Flurbiprofen Penicillamine   Butalbital Compound Four-way cold tablets Penicillin   Butazolidin Fragmin Pepto-Bismol   Carbenicillin Geminisyn Percodan   Carna Arthritis Reliever Geopen Persantine   Carprofen Gold's salt Persistin   Chloramphenicol Goody's Phenylbutazone   Chloromycetin Haltrain Piroxlcam   Clmetidine heparin Plaquenil   Cllnoril Hyco-pap Ponstel   Clofibrate Hydroxy chloroquine Propoxyphen         Before stopping any of these medications, be sure to consult the physician who ordered them.  Some, such as Coumadin (Warfarin) are ordered to prevent or treat serious conditions such as "deep thrombosis", "pumonary embolisms", and other heart problems.  The amount of time that you may need off of the medication may also vary with the medication and the reason for which you were taking it.  If you are taking any of these medications, please make sure you notify your pain physician before you undergo any procedures.

## 2017-02-20 NOTE — Progress Notes (Signed)
Safety precautions to be maintained throughout the outpatient stay will include: orient to surroundings, keep bed in low position, maintain call bell within reach at all times, provide assistance with transfer out of bed and ambulation.  

## 2017-02-20 NOTE — Patient Instructions (Signed)

## 2017-02-28 ENCOUNTER — Encounter: Payer: Self-pay | Admitting: Student in an Organized Health Care Education/Training Program

## 2017-02-28 ENCOUNTER — Ambulatory Visit
Payer: 59 | Attending: Student in an Organized Health Care Education/Training Program | Admitting: Student in an Organized Health Care Education/Training Program

## 2017-02-28 VITALS — HR 79 | Temp 98.4°F | Resp 16 | Ht 59.0 in | Wt 120.0 lb

## 2017-02-28 DIAGNOSIS — G8929 Other chronic pain: Secondary | ICD-10-CM | POA: Insufficient documentation

## 2017-02-28 DIAGNOSIS — M25522 Pain in left elbow: Secondary | ICD-10-CM

## 2017-02-28 DIAGNOSIS — M25521 Pain in right elbow: Secondary | ICD-10-CM | POA: Insufficient documentation

## 2017-02-28 DIAGNOSIS — M7918 Myalgia, other site: Secondary | ICD-10-CM

## 2017-02-28 MED ORDER — DEXAMETHASONE SODIUM PHOSPHATE 10 MG/ML IJ SOLN
10.0000 mg | Freq: Once | INTRAMUSCULAR | Status: AC
  Administered 2017-02-28: 10 mg

## 2017-02-28 MED ORDER — DEXAMETHASONE SODIUM PHOSPHATE 10 MG/ML IJ SOLN
INTRAMUSCULAR | Status: AC
  Filled 2017-02-28: qty 1

## 2017-02-28 MED ORDER — ROPIVACAINE HCL 2 MG/ML IJ SOLN
INTRAMUSCULAR | Status: AC
  Filled 2017-02-28: qty 10

## 2017-02-28 MED ORDER — ROPIVACAINE HCL 2 MG/ML IJ SOLN
10.0000 mL | Freq: Once | INTRAMUSCULAR | Status: AC
  Administered 2017-02-28: 10 mL

## 2017-02-28 NOTE — Progress Notes (Signed)
Safety precautions to be maintained throughout the outpatient stay will include: orient to surroundings, keep bed in low position, maintain call bell within reach at all times, provide assistance with transfer out of bed and ambulation.  

## 2017-02-28 NOTE — Progress Notes (Addendum)
Patient's Name: Tammy Lang  MRN: [id number]  Referring Provider: Gillis Santa, MD  DOB: 1965/07/04  PCP: Jearld Fenton, NP  DOS: 02/28/2017  Note by: Gillis Santa, MD  Service setting: Ambulatory outpatient  Specialty: Interventional Pain Management  Patient type: Established  Location: [facility] (AMB) Pain Management Facility  Visit type: Interventional Procedure   Primary Reason for Visit: Interventional Pain Management Treatment. CC: Elbow Pain (bilateral)  Procedure:       Anesthesia, Analgesia, Anxiolysis:  Type: Trigger Point Injection (1-2 muscle groups) CPT: 20552 Primary Purpose: Diagnostic Region: bilateral elbow   Level: bilateral elbow Target Area: Trigger Point Position: Sitting  Type: Local Anesthesia Local Anesthetic: Lidocaine 1% Route: Infiltration ([place]/IM) IV Access: Declined Sedation: Declined  Indication(s): Analgesia           Indications: 1. Myofascial pain   2. Elbow pain, chronic, left    Pain Score: Pre-procedure: 8 /10 Post-procedure: 0-No pain/10  Pre-op Assessment:  Tammy Lang is a 52 y.o. (year old), female patient, seen today for interventional treatment. She  has a past surgical history that includes Cesarean section; Bunionectomy; and Ganglion cyst excision. Tammy Lang has a current medication list which includes the following prescription(s): celecoxib, clotrimazole-betamethasone, diphenhydramine, etodolac, lidocaine, oxycodone-acetaminophen, tizanidine, and diclofenac sodium, and the following Facility-Administered Medications: dexamethasone and ropivacaine (pf) 2 mg/ml (0.2%). Her primarily concern today is the Elbow Pain (bilateral)  Initial Vital Signs:  Pulse Rate: 83 Temp: 98.4 F (36.9 C) Resp: 16 BP:   SpO2: 99 %  BMI: Estimated body mass index is 24.24 kg/m as calculated from the following:   Height as of this encounter: 4\' 11"  (1.499 m).   Weight as of this encounter: 120 lb (54.4 kg).  Risk Assessment: Allergies:  Reviewed. She has No Known Allergies.  Allergy Precautions: None required Coagulopathies: Reviewed. None identified.  Blood-thinner therapy: None at this time Active Infection(s): Reviewed. None identified. Tammy Lang is afebrile  Site Confirmation: Tammy Lang was asked to confirm the procedure and laterality before marking the site Procedure checklist: Completed Consent: Before the procedure and under the influence of no sedative(s), amnesic(s), or anxiolytics, the patient was informed of the treatment options, risks and possible complications. To fulfill our ethical and legal obligations, as recommended by the American Medical Association's Code of Ethics, I have informed the patient of my clinical impression; the nature and purpose of the treatment or procedure; the risks, benefits, and possible complications of the intervention; the alternatives, including doing nothing; the risk(s) and benefit(s) of the alternative treatment(s) or procedure(s); and the risk(s) and benefit(s) of doing nothing. The patient was provided information about the general risks and possible complications associated with the procedure. These may include, but are not limited to: failure to achieve desired goals, infection, bleeding, organ or nerve damage, allergic reactions, paralysis, and death. In addition, the patient was informed of those risks and complications associated to the procedure, such as failure to decrease pain; infection; bleeding; organ or nerve damage with subsequent damage to sensory, motor, and/or autonomic systems, resulting in permanent pain, numbness, and/or weakness of one or several areas of the body; allergic reactions; (i.e.: anaphylactic reaction); and/or death. Furthermore, the patient was informed of those risks and complications associated with the medications. These include, but are not limited to: allergic reactions (i.e.: anaphylactic or anaphylactoid reaction(s)); adrenal axis  suppression; blood sugar elevation that in diabetics may result in ketoacidosis or comma; water retention that in patients with history of congestive heart failure may result in  shortness of breath, pulmonary edema, and decompensation with resultant heart failure; weight gain; swelling or edema; medication-induced neural toxicity; particulate matter embolism and blood vessel occlusion with resultant organ, and/or nervous system infarction; and/or aseptic necrosis of one or more joints. Finally, the patient was informed that Medicine is not an exact science; therefore, there is also the possibility of unforeseen or unpredictable risks and/or possible complications that may result in a catastrophic outcome. The patient indicated having understood very clearly. We have given the patient no guarantees and we have made no promises. Enough time was given to the patient to ask questions, all of which were answered to the patient's satisfaction. Tammy Lang has indicated that she wanted to continue with the procedure. Attestation: I, the ordering provider, attest that I have discussed with the patient the benefits, risks, side-effects, alternatives, likelihood of achieving goals, and potential problems during recovery for the procedure that I have provided informed consent. Date  Time: 02/28/2017 11:50 AM  Pre-Procedure Preparation:  Monitoring: As per clinic protocol. Respiration, ETCO2, SpO2, BP, heart rate and rhythm monitor placed and checked for adequate function Safety Precautions: Patient was assessed for positional comfort and pressure points before starting the procedure. Time-out: I initiated and conducted the "Time-out" before starting the procedure, as per protocol. The patient was asked to participate by confirming the accuracy of the "Time Out" information. Verification of the correct person, site, and procedure were performed and confirmed by me, the nursing staff, and the patient. "Time-out"  conducted as per Joint Commission's Universal Protocol (UP.01.01.01). Time: 1223  Description of Procedure Process:   Area Prepped: bilateral elbows around olecranon process Prepping solution: ChloraPrep (2% chlorhexidine gluconate and 70% isopropyl alcohol) Safety Precautions: Aspiration looking for blood return was conducted prior to all injections. At no point did we inject any substances, as a needle was being advanced. No attempts were made at seeking any paresthesias. Safe injection practices and needle disposal techniques used. Medications properly checked for expiration dates. SDV (single dose vial) medications used. Description of the Procedure: Protocol guidelines were followed. The patient was placed in position over the fluoroscopy table. The target area was identified and the area prepped in the usual manner. Skin desensitized using vapocoolant spray. Skin & deeper tissues infiltrated with local anesthetic. Appropriate amount of time allowed to pass for local anesthetics to take effect. The procedure needles were then advanced to the target area. Proper needle placement secured. Negative aspiration confirmed. Solution injected in intermittent fashion, asking for systemic symptoms every 0.5cc of injectate. The needles were then removed and the area cleansed, making sure to leave some of the prepping solution back to take advantage of its long term bactericidal properties. Vitals:   02/28/17 1208 02/28/17 1230  Pulse: 83 79  Resp: 16 16  Temp: 98.4 F (36.9 C)   SpO2: 99%   Weight: 120 lb (54.4 kg)   Height: 4\' 11"  (1.499 m)     Start Time: 1223 hrs. End Time: 1229 hrs. Materials:  Needle(s) Type: Regular needle Gauge: 25G Length: 1.5-in Medication(s): Please see orders for medications and dosing details. 4 cc solution made of 3 cc of 0.2% ropivacaine, 1 cc of Decadron.  2 cc injected in each trigger point. Imaging Guidance:  Type of Imaging Technique: None used Indication(s):  N/A Exposure Time: No patient exposure Contrast: None used. Fluoroscopic Guidance: N/A Ultrasound Guidance: N/A Interpretation: N/A  Antibiotic Prophylaxis:   Anti-infectives (From admission, onward)   None     Indication(s): None identified  Post-operative Assessment:  Post-procedure Vital Signs:  Pulse Rate: 79 Temp: 98.4 F (36.9 C) Resp: 16 BP:   SpO2: 99 %  EBL: None  Complications: No immediate post-treatment complications observed by team, or reported by patient.  Note: The patient tolerated the entire procedure well. A repeat set of vitals were taken after the procedure and the patient was kept under observation following institutional policy, for this type of procedure. Post-procedural neurological assessment was performed, showing return to baseline, prior to discharge. The patient was provided with post-procedure discharge instructions, including a section on how to identify potential problems. Should any problems arise concerning this procedure, the patient was given instructions to immediately contact us, at any time, without hesitation. In any case, we plan to contact the patient by telephone for a follow-up status report regarding this interventional procedure.  Comments:  No additional relevant information.  Plan of Care   Imaging Orders  No imaging studies ordered today   Procedure Orders    No procedure(s) ordered today    Medications ordered for procedure: Meds ordered this encounter  Medications  . ropivacaine (PF) 2 mg/mL (0.2%) (NAROPIN) injection 10 mL  . dexamethasone (DECADRON) injection 10 mg   Medications administered: [name] had no medications administered during this visit.  See the medical record for exact dosing, route, and time of administration.  New Prescriptions   No medications on file   Disposition: Discharge home  Discharge Date & Time: 02/28/2017; 1231 hrs.   Physician-requested Follow-up: Return in about 3  weeks (around 03/21/2017) for Post Procedure Evaluation.  Future Appointments  Date Time Provider [place]  03/02/2017  8:30 AM Evelina Bucy, DPM [facility]  03/13/2017  8:00 AM Gillis Santa, MD [facility] None  03/15/2017  8:00 AM [facility] None  03/29/2017  8:45 AM Gillis Santa, MD [facility] None   Primary Care Physician: Jearld Fenton, NP Location: [facility] Note by: Gillis Santa, MD Date: 02/28/2017; Time: 1:27 PM  Disclaimer:  Medicine is not an exact science. The only guarantee in medicine is that nothing is guaranteed. It is important to note that the decision to proceed with this intervention was based on the information collected from the patient. The Data and conclusions were drawn from the patient's questionnaire, the interview, and the physical examination. Because the information was provided in large part by the patient, it cannot be guaranteed that it has not been purposely or unconsciously manipulated. Every effort has been made to obtain as much relevant data as possible for this evaluation. It is important to note that the conclusions that lead to this procedure are derived in large part from the available data. Always take into account that the treatment will also be dependent on availability of resources and existing treatment guidelines, considered by other Pain Management Practitioners as being common knowledge and practice, at the time of the intervention. For Medico-Legal purposes, it is also important to point out that variation in procedural techniques and pharmacological choices are the acceptable norm. The indications, contraindications, technique, and results of the above procedure should only be interpreted and judged by a Board-Certified Interventional Pain Specialist with extensive familiarity and expertise in the same exact procedure and technique.

## 2017-02-28 NOTE — Progress Notes (Deleted)
Patient's Name: Tammy Lang  MRN: [id number]  Referring Provider: Gillis Santa, MD  DOB: 1965-04-02  PCP: Jearld Fenton, NP  DOS: 02/28/2017  Note by: Gillis Santa, MD  Service setting: Ambulatory outpatient  Specialty: Interventional Pain Management  Patient type: Established  Location: [facility] (AMB) Pain Management Facility  Visit type: Interventional Procedure   Primary Reason for Visit: Interventional Pain Management Treatment. CC: Elbow Pain (bilateral)  Procedure:  Anesthesia, Analgesia, Anxiolysis:  Type: Diagnostic Elbow Joint Steroid Injection Region: Lateral Elbow Area Level: Elbow Laterality: Bilateral  Type: Local Anesthesia Local Anesthetic: Lidocaine 1% Route: Infiltration ([place]/IM) IV Access: Declined Sedation: Declined  Indication(s): Analgesia           Indications: 1. Elbow pain, chronic, left   2. Myofascial pain    Pain Score: Pre-procedure: 8 /10 Post-procedure: 0-No pain/10  Pre-op Assessment:  Tammy Lang is a 52 y.o. (year old), female patient, seen today for interventional treatment. She  has a past surgical history that includes Cesarean section; Bunionectomy; and Ganglion cyst excision. Tammy Lang has a current medication list which includes the following prescription(s): celecoxib, clotrimazole-betamethasone, diphenhydramine, etodolac, lidocaine, oxycodone-acetaminophen, tizanidine, and diclofenac sodium. Her primarily concern today is the Elbow Pain (bilateral)  Initial Vital Signs:  Pulse Rate: 83 Temp: 98.4 F (36.9 C) Resp: 16 BP:   SpO2: 99 %  BMI: Estimated body mass index is 24.24 kg/m as calculated from the following:   Height as of this encounter: 4\' 11"  (1.499 m).   Weight as of this encounter: 120 lb (54.4 kg).  Risk Assessment: Allergies: Reviewed. She has No Known Allergies.  Allergy Precautions: None required Coagulopathies: Reviewed. None identified.  Blood-thinner therapy: None at this time Active Infection(s):  Reviewed. None identified. Tammy Lang is afebrile  Site Confirmation: Tammy Lang was asked to confirm the procedure and laterality before marking the site Procedure checklist: Completed Consent: Before the procedure and under the influence of no sedative(s), amnesic(s), or anxiolytics, the patient was informed of the treatment options, risks and possible complications. To fulfill our ethical and legal obligations, as recommended by the American Medical Association's Code of Ethics, I have informed the patient of my clinical impression; the nature and purpose of the treatment or procedure; the risks, benefits, and possible complications of the intervention; the alternatives, including doing nothing; the risk(s) and benefit(s) of the alternative treatment(s) or procedure(s); and the risk(s) and benefit(s) of doing nothing. The patient was provided information about the general risks and possible complications associated with the procedure. These may include, but are not limited to: failure to achieve desired goals, infection, bleeding, organ or nerve damage, allergic reactions, paralysis, and death. In addition, the patient was informed of those risks and complications associated to the procedure, such as failure to decrease pain; infection; bleeding; organ or nerve damage with subsequent damage to sensory, motor, and/or autonomic systems, resulting in permanent pain, numbness, and/or weakness of one or several areas of the body; allergic reactions; (i.e.: anaphylactic reaction); and/or death. Furthermore, the patient was informed of those risks and complications associated with the medications. These include, but are not limited to: allergic reactions (i.e.: anaphylactic or anaphylactoid reaction(s)); adrenal axis suppression; blood sugar elevation that in diabetics may result in ketoacidosis or comma; water retention that in patients with history of congestive heart failure may result in shortness of  breath, pulmonary edema, and decompensation with resultant heart failure; weight gain; swelling or edema; medication-induced neural toxicity; particulate matter embolism and blood vessel occlusion with resultant organ,  and/or nervous system infarction; and/or aseptic necrosis of one or more joints. Finally, the patient was informed that Medicine is not an exact science; therefore, there is also the possibility of unforeseen or unpredictable risks and/or possible complications that may result in a catastrophic outcome. The patient indicated having understood very clearly. We have given the patient no guarantees and we have made no promises. Enough time was given to the patient to ask questions, all of which were answered to the patient's satisfaction. Tammy Lang has indicated that she wanted to continue with the procedure. Attestation: I, the ordering provider, attest that I have discussed with the patient the benefits, risks, side-effects, alternatives, likelihood of achieving goals, and potential problems during recovery for the procedure that I have provided informed consent. Date  Time: {CHL [facility]-PAIN TIME CHOICES:21018001}  Pre-Procedure Preparation:  Monitoring: As per clinic protocol. Respiration, ETCO2, SpO2, BP, heart rate and rhythm monitor placed and checked for adequate function Safety Precautions: Patient was assessed for positional comfort and pressure points before starting the procedure. Time-out: I initiated and conducted the "Time-out" before starting the procedure, as per protocol. The patient was asked to participate by confirming the accuracy of the "Time Out" information. Verification of the correct person, site, and procedure were performed and confirmed by me, the nursing staff, and the patient. "Time-out" conducted as per Joint Commission's Universal Protocol (UP.01.01.01). Time: 1223  Description of Procedure Process:   Position: Sitting Target Area: Intra-articular elbow  joint Approach: Lateral approach. Area Prepped: Entire elbow Region Prepping solution: ChloraPrep (2% chlorhexidine gluconate and 70% isopropyl alcohol) Safety Precautions: Aspiration looking for blood return was conducted prior to all injections. At no point did we inject any substances, as a needle was being advanced. No attempts were made at seeking any paresthesias. Safe injection practices and needle disposal techniques used. Medications properly checked for expiration dates. SDV (single dose vial) medications used. Latex Allergy precautions taken. Contrast Allergy precautions taken.   Description of the Procedure: Protocol guidelines were followed. The patient was placed in position. The target area was identified and the area prepped in the usual manner. Skin & deeper tissues infiltrated with local anesthetic. Appropriate amount of time allowed to pass for local anesthetics to take effect. The procedure needles were then advanced to the target area. Proper needle placement secured. Negative aspiration confirmed. Solution injected in intermittent fashion, asking for systemic symptoms every 0.5cc of injectate. The needles were then removed and the area cleansed, making sure to leave some of the prepping solution back to take advantage of its long term bactericidal properties. Vitals:   02/28/17 1208 02/28/17 1230  Pulse: 83 79  Resp: 16 16  Temp: 98.4 F (36.9 C)   SpO2: 99%   Weight: 120 lb (54.4 kg)   Height: 4\' 11"  (1.499 m)     Start Time: 1223 hrs. End Time: 1229 hrs. Materials:  Needle(s) Type: Regular needle Gauge: 22G Length: 3.5-in Medication(s): Please see orders for medications and dosing details.  Imaging Guidance:  Type of Imaging Technique: None used Indication(s): N/A Exposure Time: No patient exposure Contrast: None used. Fluoroscopic Guidance: N/A Ultrasound Guidance: N/A Interpretation: N/A  Antibiotic Prophylaxis:   Anti-infectives (From admission, onward)    None     Indication(s): None identified  Post-operative Assessment:  Post-procedure Vital Signs:  Pulse Rate: 79 Temp: 98.4 F (36.9 C) Resp: 16 BP:   SpO2: 99 %  EBL: None  Complications: No immediate post-treatment complications observed by team, or reported by patient.  Note: The patient tolerated the entire procedure well. A repeat set of vitals were taken after the procedure and the patient was kept under observation following institutional policy, for this type of procedure. Post-procedural neurological assessment was performed, showing return to baseline, prior to discharge. The patient was provided with post-procedure discharge instructions, including a section on how to identify potential problems. Should any problems arise concerning this procedure, the patient was given instructions to immediately contact us, at any time, without hesitation. In any case, we plan to contact the patient by telephone for a follow-up status report regarding this interventional procedure.  Comments:  No additional relevant information.  Plan of Care   Imaging Orders  No imaging studies ordered today   Procedure Orders    No procedure(s) ordered today    Medications ordered for procedure: No orders of the defined types were placed in this encounter.  Medications administered: [name] had no medications administered during this visit.  See the medical record for exact dosing, route, and time of administration.  New Prescriptions   No medications on file   Disposition: Discharge home  Discharge Date & Time: 02/28/2017; 1231 hrs.   Physician-requested Follow-up: Return in about 3 weeks (around 03/21/2017) for Post Procedure Evaluation.  Future Appointments  Date Time Provider [place]  03/02/2017  8:30 AM Evelina Bucy, DPM [facility]  03/13/2017  8:00 AM Gillis Santa, MD [facility] None  03/15/2017  8:00 AM [facility] None  03/29/2017   8:45 AM Gillis Santa, MD [facility] None   Primary Care Physician: Jearld Fenton, NP Location: [facility] Note by: Gillis Santa, MD Date: 02/28/2017; Time: 12:44 PM  Disclaimer:  Medicine is not an exact science. The only guarantee in medicine is that nothing is guaranteed. It is important to note that the decision to proceed with this intervention was based on the information collected from the patient. The Data and conclusions were drawn from the patient's questionnaire, the interview, and the physical examination. Because the information was provided in large part by the patient, it cannot be guaranteed that it has not been purposely or unconsciously manipulated. Every effort has been made to obtain as much relevant data as possible for this evaluation. It is important to note that the conclusions that lead to this procedure are derived in large part from the available data. Always take into account that the treatment will also be dependent on availability of resources and existing treatment guidelines, considered by other Pain Management Practitioners as being common knowledge and practice, at the time of the intervention. For Medico-Legal purposes, it is also important to point out that variation in procedural techniques and pharmacological choices are the acceptable norm. The indications, contraindications, technique, and results of the above procedure should only be interpreted and judged by a Board-Certified Interventional Pain Specialist with extensive familiarity and expertise in the same exact procedure and technique.

## 2017-02-28 NOTE — Addendum Note (Signed)
Addended by: Gillis Santa on: 02/28/2017 01:27 PM   Modules accepted: Orders

## 2017-03-01 ENCOUNTER — Telehealth: Payer: Self-pay

## 2017-03-01 NOTE — Telephone Encounter (Signed)
Unable to talk with patient. Left message to call if needed.

## 2017-03-02 ENCOUNTER — Ambulatory Visit: Payer: Self-pay | Admitting: Podiatry

## 2017-03-02 ENCOUNTER — Encounter: Payer: Self-pay | Admitting: Podiatry

## 2017-03-02 ENCOUNTER — Ambulatory Visit: Payer: 59 | Admitting: Podiatry

## 2017-03-02 DIAGNOSIS — M722 Plantar fascial fibromatosis: Secondary | ICD-10-CM | POA: Diagnosis not present

## 2017-03-02 DIAGNOSIS — M79673 Pain in unspecified foot: Secondary | ICD-10-CM

## 2017-03-02 DIAGNOSIS — L84 Corns and callosities: Secondary | ICD-10-CM | POA: Diagnosis not present

## 2017-03-02 DIAGNOSIS — L989 Disorder of the skin and subcutaneous tissue, unspecified: Secondary | ICD-10-CM | POA: Diagnosis not present

## 2017-03-02 MED ORDER — METHYLPREDNISOLONE 4 MG PO TABS: ORAL_TABLET | ORAL | 0 refills | Status: DC

## 2017-03-02 MED ORDER — MELOXICAM 15 MG PO TABS: 15.0000 mg | ORAL_TABLET | Freq: Every day | ORAL | 3 refills | Status: DC

## 2017-03-02 NOTE — Progress Notes (Signed)
   Subjective:    Patient ID: Tammy Lang, female    DOB: 02/10/1965, 52 y.o.   MRN: [id number]  HPI    Review of Systems  All other systems reviewed and are negative.      Objective:   Physical Exam        Assessment & Plan:

## 2017-03-04 NOTE — Progress Notes (Signed)
   Subjective: 52 year old female presenting today as a new patient with a chief complaint of burning pain to the medial side of the right second toe secondary to a callus lesion that began one month ago. She also reports possible nail fungus to all the nails on bilateral feet. She has been using OTC nail fungus lacquer and wearing corn pads with no significant relief. There are no modifying factors noted. Patient presents today for further treatment and evaluation.   Past Medical History:  Diagnosis Date  . Allergy   . Arthritis   . Spinal stenosis of cervical region      Objective: Physical Exam General: The patient is alert and oriented x3 in no acute distress.  Dermatology: Hyperkeratotic lesion present on the 2nd toe of the right foot. Pain on palpation with a central nucleated core noted. Hyperkeratotic, discolored, thickened, onychodystrophy of bilateral great toenails. Skin is warm, dry and supple bilateral lower extremities. Negative for open lesions or macerations bilateral.   Vascular: Dorsalis Pedis and Posterior Tibial pulses palpable bilateral.  Capillary fill time is immediate to all digits.  Neurological: Epicritic and protective threshold intact bilateral.   Musculoskeletal: Tenderness to palpation at the medial calcaneal tubercale and through the insertion of the plantar fascia of the right foot. All other joints range of motion within normal limits bilateral. Strength 5/5 in all groups bilateral.   Assessment: 1. Plantar fasciitis right 2. 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 Dystrophic nails bilateral great toes secondary to ingrown procedures  Plan of Care:  1. Patient evaluated.  2. Injection of 0.5cc Celestone soluspan injected into the right plantar fascia  3. Rx for Medrol Dose pack placed 4. Rx for Mobic 15mg  ordered for patient. 4. Plantar fascial band and night splint dispensed 5. Instructed patient regarding therapies and modalities at home to alleviate symptoms.   6. Silicone toe caps dispensed for 2nd toe of the right foot.  7. Recommended Biotin supplements for nails.  8. Excisional debridement of keratoic lesion using a chisel blade was performed without incident. Dressed with light dressing. 9. Return to clinic in 4 weeks.     Edrick Kins, DPM [facility]  Dr. Edrick Kins, DPM    [address], [place] [address]                Office [phone]  Fax [phone]

## 2017-03-13 ENCOUNTER — Encounter: Payer: Self-pay | Admitting: Student in an Organized Health Care Education/Training Program

## 2017-03-13 ENCOUNTER — Ambulatory Visit
Payer: 59 | Attending: Student in an Organized Health Care Education/Training Program | Admitting: Student in an Organized Health Care Education/Training Program

## 2017-03-13 ENCOUNTER — Other Ambulatory Visit: Payer: Self-pay

## 2017-03-13 VITALS — BP 134/95 | HR 90 | Temp 98.7°F | Resp 18 | Ht 59.0 in | Wt 120.0 lb

## 2017-03-13 DIAGNOSIS — Z8049 Family history of malignant neoplasm of other genital organs: Secondary | ICD-10-CM | POA: Diagnosis not present

## 2017-03-13 DIAGNOSIS — F17210 Nicotine dependence, cigarettes, uncomplicated: Secondary | ICD-10-CM | POA: Diagnosis not present

## 2017-03-13 DIAGNOSIS — Z888 Allergy status to other drugs, medicaments and biological substances status: Secondary | ICD-10-CM | POA: Insufficient documentation

## 2017-03-13 DIAGNOSIS — M47812 Spondylosis without myelopathy or radiculopathy, cervical region: Secondary | ICD-10-CM | POA: Insufficient documentation

## 2017-03-13 DIAGNOSIS — M4802 Spinal stenosis, cervical region: Secondary | ICD-10-CM | POA: Insufficient documentation

## 2017-03-13 DIAGNOSIS — Z9889 Other specified postprocedural states: Secondary | ICD-10-CM | POA: Insufficient documentation

## 2017-03-13 DIAGNOSIS — Z807 Family history of other malignant neoplasms of lymphoid, hematopoietic and related tissues: Secondary | ICD-10-CM | POA: Insufficient documentation

## 2017-03-13 DIAGNOSIS — M542 Cervicalgia: Secondary | ICD-10-CM

## 2017-03-13 DIAGNOSIS — Z79899 Other long term (current) drug therapy: Secondary | ICD-10-CM | POA: Diagnosis not present

## 2017-03-13 DIAGNOSIS — G5603 Carpal tunnel syndrome, bilateral upper limbs: Secondary | ICD-10-CM | POA: Insufficient documentation

## 2017-03-13 DIAGNOSIS — M4722 Other spondylosis with radiculopathy, cervical region: Secondary | ICD-10-CM | POA: Diagnosis not present

## 2017-03-13 DIAGNOSIS — M1990 Unspecified osteoarthritis, unspecified site: Secondary | ICD-10-CM | POA: Diagnosis not present

## 2017-03-13 DIAGNOSIS — M25522 Pain in left elbow: Secondary | ICD-10-CM | POA: Diagnosis present

## 2017-03-13 DIAGNOSIS — Z811 Family history of alcohol abuse and dependence: Secondary | ICD-10-CM | POA: Diagnosis not present

## 2017-03-13 DIAGNOSIS — M25521 Pain in right elbow: Secondary | ICD-10-CM | POA: Diagnosis present

## 2017-03-13 MED ORDER — CELECOXIB 200 MG PO CAPS: 200.0000 mg | ORAL_CAPSULE | Freq: Every day | ORAL | 4 refills | Status: DC

## 2017-03-13 NOTE — Progress Notes (Signed)
Patient's Name: Tammy Lang  MRN: [id number]  Referring Provider: Jearld Fenton, NP  DOB: 1965/02/10  PCP: Jearld Fenton, NP  DOS: 03/13/2017  Note by: Gillis Santa, MD  Service setting: Ambulatory outpatient  Specialty: Interventional Pain Management  Location: [facility] (AMB) Pain Management Facility    Patient type: Established   Primary Reason(s) for Visit: Encounter for post-procedure evaluation of chronic illness with mild to moderate exacerbation CC: Elbow Pain (bilateral)  HPI  Tammy Lang is a 52 y.o. year old, female patient, who comes today for a post-procedure evaluation. She has Osteoarthritis of spine with radiculopathy, cervical region; Seasonal allergies; Chronic pain syndrome; Bilateral carpal tunnel syndrome; Elbow pain, chronic, left; and Myofascial pain on their problem list. Her primarily concern today is the Elbow Pain (bilateral)  Pain Assessment: Location: Right, Left Elbow Radiating: numbness in left hand Onset: More than a month ago Duration: Chronic pain Quality:   Severity: 7 /10 (self-reported pain score)  Note: Reported level is compatible with observation.                         When using our objective Pain Scale, levels between 6 and 10/10 are said to belong in an emergency room, as it progressively worsens from a 6/10, described as severely limiting, requiring emergency care not usually available at an outpatient pain management facility. At a 6/10 level, communication becomes difficult and requires great effort. Assistance to reach the emergency department may be required. Facial flushing and profuse sweating along with potentially dangerous increases in heart rate and blood pressure will be evident. Effect on ADL:   Timing: Constant Modifying factors: nothing  Tammy Lang comes in today for post-procedure evaluation after the treatment done on 02/28/2017.  Further details on both, my assessment(s), as well as the proposed treatment plan, please see  below.  Post-Procedure Assessment  02/28/2017 Procedure:Bilateral TPI/elbow injection Pre-procedure pain score:  8/10 Post-procedure pain score: 0/10         Influential Factors: BMI: 24.24 kg/m Intra-procedural challenges: None observed.         Assessment challenges: None detected.              Reported side-effects: None.        Post-procedural adverse reactions or complications: None reported         Sedation: Please see nurses note. When no sedatives are used, the analgesic levels obtained are directly associated to the effectiveness of the local anesthetics. However, when sedation is provided, the level of analgesia obtained during the initial 1 hour following the intervention, is believed to be the result of a combination of factors. These factors may include, but are not limited to: 1. The effectiveness of the local anesthetics used. 2. The effects of the analgesic(s) and/or anxiolytic(s) used. 3. The degree of discomfort experienced by the patient at the time of the procedure. 4. The patients ability and reliability in recalling and recording the events. 5. The presence and influence of possible secondary gains and/or psychosocial factors. Reported result: Relief experienced during the 1st hour after the procedure: 100 % (Ultra-Short Term Relief)            Interpretative annotation: Clinically appropriate result. Analgesia during this period is likely to be Local Anesthetic and/or IV Sedative (Analgesic/Anxiolytic) related.          Effects of local anesthetic: The analgesic effects attained during this period are directly associated to the localized infiltration of local anesthetics  and therefore cary significant diagnostic value as to the etiological location, or anatomical origin, of the pain. Expected duration of relief is directly dependent on the pharmacodynamics of the local anesthetic used. Long-acting (4-6 hours) anesthetics used.  Reported result: Relief during the next 4  to 6 hour after the procedure: 100 % (Short-Term Relief)            Interpretative annotation: Clinically appropriate result. Analgesia during this period is likely to be Local Anesthetic-related.          Long-term benefit: Defined as the period of time past the expected duration of local anesthetics (1 hour for short-acting and 4-6 hours for long-acting). With the possible exception of prolonged sympathetic blockade from the local anesthetics, benefits during this period are typically attributed to, or associated with, other factors such as analgesic sensory neuropraxia, antiinflammatory effects, or beneficial biochemical changes provided by agents other than the local anesthetics.  Reported result: Extended relief following procedure: 100 % (Long-Term Relief)            Interpretative annotation: Clinically appropriate result. Good relief. No permanent benefit expected. Inflammation plays a part in the etiology to the pain.          Current benefits: Defined as reported results that persistent at this point in time.   Analgesia: 50 %            Function: Somewhat improved ROM: Somewhat improved Interpretative annotation: Recurrence of symptoms. No permanent benefit expected. Effective diagnostic intervention.          Interpretation: Results would suggest a successful diagnostic intervention.                  Plan:  Please see "Plan of Care" for details.                Note: Lab results reviewed.  Recent Diagnostic Imaging Results  [place] CHEST SOFT TISSUE CLINICAL DATA:  Probable layering of the left lateral chest for 2 weeks.  EXAM: ULTRASOUND OF HEAD/NECK SOFT TISSUES  TECHNIQUE: Ultrasound examination of the head and neck soft tissues was performed in the area of clinical concern.  COMPARISON:  None.  FINDINGS: Limited ultrasound images obtained in the left lateral chest over the area of palpable abnormality demonstrate an ovoid circumscribed nodule in the subcutaneous tissues.  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 The lesion measures 3.3 x 0.9 x 4.5 cm. No significant flow is demonstrated on color flow Doppler imaging. No fluid collections.  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 This corresponds to the palpable abnormality.  Electronically Signed   By: Lucienne Capers M.D.   On: 01/10/2017 18:39  Complexity Note: Imaging results reviewed. Results shared with Tammy Lang, using Layman's terms.                         Meds   Current Outpatient Medications:  .  albuterol (PROVENTIL HFA;VENTOLIN HFA) 108 (90 Base) MCG/ACT inhaler, Inhale 2 puffs into the lungs every 6 (six) hours as needed for wheezing or shortness of breath., Disp: , Rfl:  .  celecoxib (CELEBREX) 200 MG capsule, Take 1 capsule (200 mg total) by mouth daily., Disp: 30 capsule, Rfl: 4 .  clotrimazole-betamethasone (LOTRISONE) cream, Apply 1 application topically 2 (two) times daily. For up to 2 weeks, Disp: 45 g, Rfl: 0 .  diphenhydrAMINE (BENADRYL) 25  MG tablet, Take 25 mg by mouth at bedtime. , Disp: , Rfl:  .  etodolac (LODINE) 200 MG capsule, Take 1 capsule (200 mg total) by mouth 3 (three) times daily., Disp: 90 capsule, Rfl: 0 .  meloxicam (MOBIC) 15 MG tablet, Take 1 tablet (15 mg total) by mouth daily., Disp: 30 tablet, Rfl: 3 .  methylPREDNISolone (MEDROL) 4 MG tablet, Take as directed, Disp: 21 tablet, Rfl: 0 .  Multiple Vitamin (MULTIVITAMIN WITH MINERALS) TABS tablet, Take 1 tablet by mouth daily., Disp: , Rfl:  .  Multiple Vitamins-Minerals (HAIR SKIN AND NAILS FORMULA) TABS, Take 1 tablet by mouth daily., Disp: , Rfl:  .  oxyCODONE-acetaminophen (PERCOCET) 10-325 MG tablet, Take 1 tablet by mouth every 8 (eight) hours as needed for pain. To be filled on or after: 02/09/17, 03/09/17, Disp: 90 tablet, Rfl: 0 .  tiZANidine (ZANAFLEX) 4 MG tablet, Take 1 tablet (4 mg  total) by mouth 3 (three) times daily., Disp: 90 tablet, Rfl: 1  ROS  Constitutional: Denies any fever or chills Gastrointestinal: No reported hemesis, hematochezia, vomiting, or acute GI distress Musculoskeletal: Denies any acute onset joint swelling, redness, loss of ROM, or weakness Neurological: No reported episodes of acute onset apraxia, aphasia, dysarthria, agnosia, amnesia, paralysis, loss of coordination, or loss of consciousness  Allergies  Tammy Lang is allergic to duloxetine.  PFSH  Drug: Tammy Lang  reports that she does not use drugs. Alcohol:  reports that she drinks alcohol. Tobacco:  reports that she has been smoking cigarettes.  She has a 15.00 pack-year smoking history. she has never used smokeless tobacco. Medical:  has a past medical history of Allergy, Arthritis, and Spinal stenosis of cervical region. Surgical: Tammy Lang  has a past surgical history that includes Cesarean section; Bunionectomy; and Ganglion cyst excision. Family: family history includes Alcohol abuse in her mother; Cervical cancer in her mother; Hodgkin's lymphoma in her sister.  Constitutional Exam  General appearance: Well nourished, well developed, and well hydrated. In no apparent acute distress Vitals:   03/13/17 0827  BP: (!) 134/95  Pulse: 90  Resp: 18  Temp: 98.7 F (37.1 C)  TempSrc: Oral  SpO2: 100%  Weight: 120 lb (54.4 kg)  Height: 4\' 11"  (1.499 m)   BMI Assessment: Estimated body mass index is 24.24 kg/m as calculated from the following:   Height as of this encounter: 4\' 11"  (1.499 m).   Weight as of this encounter: 120 lb (54.4 kg).  BMI interpretation table: BMI level Category Range association with higher incidence of chronic pain  <18 kg/m2 Underweight   18.5-24.9 kg/m2 Ideal body weight   25-29.9 kg/m2 Overweight Increased incidence by 20%  30-34.9 kg/m2 Obese (Class I) Increased incidence by 68%  35-39.9 kg/m2 Severe obesity (Class II) Increased incidence by  136%  >40 kg/m2 Extreme obesity (Class III) Increased incidence by 254%   BMI Readings from Last 4 Encounters:  03/13/17 24.24 kg/m  02/28/17 24.24 kg/m  02/20/17 24.24 kg/m  02/01/17 24.24 kg/m  And arthropathic Wt Readings from Last 4 Encounters:  03/13/17 120 lb (54.4 kg)  02/28/17 120 lb (54.4 kg)  02/20/17 120 lb (54.4 kg)  02/01/17 120 lb (54.4 kg)  Psych/Mental status: Alert, oriented x 3 (person, place, & time)       Eyes: PERLA Respiratory: No evidence of acute respiratory distress  Cervical Spine Area Exam  Skin & Axial Inspection: No masses, redness, edema, swelling, or associated skin lesions Alignment: Symmetrical Functional ROM: Decreased ROM  Stability: No instability detected Muscle Tone/Strength: Functionally intact. No obvious neuro-muscular anomalies detected. Sensory (Neurological): Dermatomal pain pattern and arthropathic Palpation: Complains of area being tender to palpation Positive provocative maneuver for for cervical facet disease  Upper Extremity (UE) Exam    Side: Right upper extremity  Side: Left upper extremity  Skin & Extremity Inspection: Skin color, temperature, and hair growth are WNL. No peripheral edema or cyanosis. No masses, redness, swelling, asymmetry, or associated skin lesions. No contractures.  Skin & Extremity Inspection: Skin color, temperature, and hair growth are WNL. No peripheral edema or cyanosis. No masses, redness, swelling, asymmetry, or associated skin lesions. No contractures.  Functional ROM: Unrestricted ROM          Functional ROM: Unrestricted ROM          Muscle Tone/Strength: Functionally intact. No obvious neuro-muscular anomalies detected.  Muscle Tone/Strength: Functionally intact. No obvious neuro-muscular anomalies detected.  Sensory (Neurological): Unimpaired          Sensory (Neurological): Unimpaired          Palpation: No palpable anomalies              Palpation: No palpable anomalies               Specialized Test(s): Deferred         Specialized Test(s): Deferred          Thoracic Spine Area Exam  Skin & Axial Inspection: No masses, redness, or swelling Alignment: Symmetrical Functional ROM: Unrestricted ROM Stability: No instability detected Muscle Tone/Strength: Functionally intact. No obvious neuro-muscular anomalies detected. Sensory (Neurological): Unimpaired Muscle strength & Tone: No palpable anomalies  Lumbar Spine Area Exam  Skin & Axial Inspection: No masses, redness, or swelling Alignment: Symmetrical Functional ROM: Unrestricted ROM      Stability: No instability detected Muscle Tone/Strength: Functionally intact. No obvious neuro-muscular anomalies detected. Sensory (Neurological): Unimpaired Palpation: No palpable anomalies       Provocative Tests: Lumbar Hyperextension and rotation test: evaluation deferred today       Lumbar Lateral bending test: evaluation deferred today       Patrick's Maneuver: evaluation deferred today                    Gait & Posture Assessment  Ambulation: Unassisted Gait: Relatively normal for age and body habitus Posture: WNL   Lower Extremity Exam    Side: Right lower extremity  Side: Left lower extremity  Skin & Extremity Inspection: Skin color, temperature, and hair growth are WNL. No peripheral edema or cyanosis. No masses, redness, swelling, asymmetry, or associated skin lesions. No contractures.  Skin & Extremity Inspection: Skin color, temperature, and hair growth are WNL. No peripheral edema or cyanosis. No masses, redness, swelling, asymmetry, or associated skin lesions. No contractures.  Functional ROM: Unrestricted ROM          Functional ROM: Unrestricted ROM          Muscle Tone/Strength: Functionally intact. No obvious neuro-muscular anomalies detected.  Muscle Tone/Strength: Functionally intact. No obvious neuro-muscular anomalies detected.  Sensory (Neurological): Unimpaired  Sensory (Neurological): Unimpaired   Palpation: No palpable anomalies  Palpation: No palpable anomalies   Assessment  Primary Diagnosis & Pertinent Problem List: The primary encounter diagnosis was Spondylosis of cervical region without myelopathy or radiculopathy. Diagnoses of Cervicalgia, Osteoarthritis of spine with radiculopathy, cervical region, and Neck pain were also pertinent to this visit.  Status Diagnosis  Persistent Not improving Not improving 1. Spondylosis of cervical  region without myelopathy or radiculopathy   2. Cervicalgia   3. Osteoarthritis of spine with radiculopathy, cervical region   4. Neck pain       52 year old female with bilateral elbow pain who presents as a follow-up status post bilateral trigger point injections at her elbows who notes significant improvement in her elbow pain which is ongoing.  She noted complete pain relief for approximately 2 weeks but now notes return of elbow pain.  Patient is also endorsing persistent neck pain that is worse with lateral rotation cervical extension.  She states that the pain radiates down into her scapulas and occasionally down her biceps.  Patient does have full motor strength on her upper extremities but I still think it is reasonable to pursue a cervical MRI of her neck to rule out any cervical radicular pathology that could be contributing to her symptoms.  I will also refill the patient's Celebrex 200 mg daily.  She finds this beneficial.  No side effects noted with this medication.  Patient will follow up with me in 3-4 weeks after her cervical MRI to discuss treatment plans which could include cervical epidural steroid injection.  Plan of Care  Pharmacotherapy (Medications Ordered): Meds ordered this encounter  Medications  . celecoxib (CELEBREX) 200 MG capsule    Sig: Take 1 capsule (200 mg total) by mouth daily.    Dispense:  30 capsule    Refill:  4   Lab-work, procedure(s), and/or referral(s): Orders Placed This Encounter  Procedures  .  MR CERVICAL SPINE WO CONTRAST    Provider-requested follow-up: Return in about 4 weeks (around 04/10/2017) for Medication Management.   Time Note: Greater than 50% of the 25 minute(s) of face-to-face time spent with Tammy Lang, was spent in counseling/coordination of care regarding: further work up of the patient's neck pain that has been persistent in nature, Tammy Lang's primary cause of pain, the treatment plan, treatment alternatives, medication side effects and the results, interpretation and significance of  her recent diagnostic interventional treatment(s). She is Future Appointments  Date Time Provider [place]  03/15/2017  8:00 AM [facility] None  04/03/2017  8:00 AM Gillis Santa, MD [facility] None  04/06/2017  8:15 AM Edrick Kins, DPM [facility]    Primary Care Physician: Jearld Fenton, NP Location: [facility] Outpatient [facility] Management Facility Note by: Gillis Santa, M.D Date: 03/13/2017; Time: 8:44 AM  Patient Instructions  A prescription for Celebrex was sent to your pharmacy.

## 2017-03-13 NOTE — Patient Instructions (Signed)
A prescription for Celebrex was sent to your pharmacy.

## 2017-03-13 NOTE — Progress Notes (Signed)
Safety precautions to be maintained throughout the outpatient stay will include: orient to surroundings, keep bed in low position, maintain call bell within reach at all times, provide assistance with transfer out of bed and ambulation.  

## 2017-03-15 ENCOUNTER — Telehealth: Payer: Self-pay | Admitting: *Deleted

## 2017-03-15 ENCOUNTER — Other Ambulatory Visit: Payer: Self-pay | Admitting: Internal Medicine

## 2017-03-15 ENCOUNTER — Inpatient Hospital Stay: Admission: RE | Admit: 2017-03-15 | Payer: 59 | Source: Ambulatory Visit

## 2017-03-15 MED ORDER — ALBUTEROL SULFATE HFA 108 (90 BASE) MCG/ACT IN AERS: 2.0000 | INHALATION_SPRAY | Freq: Four times a day (QID) | RESPIRATORY_TRACT | 1 refills | Status: DC | PRN

## 2017-03-15 NOTE — Telephone Encounter (Signed)
Requesting refill of Albuterol  Historical Provider  LOV 12/27/16  R. Baity  [id number] - [place], [place]

## 2017-03-15 NOTE — Telephone Encounter (Signed)
Pt established care 09/14/16 and seen acute visit 12/27/16. Do not see albuterol noted in office visits and I do not see where R Baity NP has previously prescribed.Please advise.

## 2017-03-15 NOTE — Telephone Encounter (Signed)
Copied from [place] [id number]. Topic: General - Other >> Mar 15, 2017 10:26 AM Darl Householder, RMA wrote: Reason for CRM: Medication refill request for albuterol (PROVENTIL HFA;VENTOLIN HFA) 108 (90 Base) MCG/ACT inhaler to be sent to [facility]

## 2017-03-18 NOTE — H&P (Signed)
[facility] Location: [place] Office Patient #: [id number] DOB: [birth year] Married / Language: Undefined / Race: Refused to Report/Unreported Female      History of Present Illness        this is a pleasant 52 year old female, referred by Webb Silversmith, NP at [facility] for evaluation of a left chest wall mass.      the patient states that she noticed a lump this year. She thinks it has been stable in size. No infection or drainage. She's had multiple cysts in the past. Following evaluation at her primary care office an ultrasound was performed on January 11, 2016. This shows a 3.3 x 0.9 x 4.5 cm nodule in the left lateral chest which correlates with the palpable abnormality. 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      Past history is significant for a left breast mass excised 19 years ago. This was benign. She's had a couple of sebaceous cysts. She is concerned that her blood pressure will "go through the roof" during or after anesthesia and asked if I would admit her for that. She does not have a history of hypertension. She has a history of cervical spinal stenosis. she gets annual mammography and annual checkups with her primary care provider Family history reveals cervical cancer and alcohol abuse in the mother. Hodgkin's lymphoma in the sister. Social history reveals she is married. Has 2 children. Lives in [place]. Works as a Presenter, broadcasting at [employer]. Smokes a pack or lastof cigarettes per day. Drinks alcohol occasionally.       Exam confirmed a palpable mass in the left lateral chest wall under the bra strap. 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 I told her this might take the form of a monitor sedation or an LMA in a lateral position. I told her she could go home the same day and her husband would need to drive her. I discussed the indications, details, techniques, numerous risk of the  surgery she is.aware of the risk of bleeding infection nerve damage. I told her we would send this for pathologic exam she understands all these issues. All of her questions were answered. She agrees with this plan.   Allergies  No Known Drug Allergies   Vitals  Weight: 121.25 lb Height: 61in Body Surface Area: 1.53 m Body Mass Index: 22.91 kg/m  Temp.: 98.44F  Pulse: 96 (Regular)  Resp.: 18 (Unlabored)  P.OX: 97% (Room air) BP: 122/86     Physical Exam   General Mental Status-Alert. General Appearance-Consistent with stated age. Hydration-Well hydrated. Voice-Normal.  Head and Neck Head-normocephalic, atraumatic with no lesions or palpable masses. Trachea-midline. Thyroid Gland Characteristics - normal size and consistency.  Eye Eyeball - Bilateral-Extraocular movements intact. Sclera/Conjunctiva - Bilateral-No scleral icterus.  Chest and Lung Exam Chest and lung exam reveals -quiet, even and easy respiratory effort with no use of accessory muscles and on auscultation, normal breath sounds, no adventitious sounds and normal vocal resonance. Inspection Chest Wall - Normal. Back - normal. Note: there is a palpable subcutaneous mass in the lateral left chest wall. Under the bra strap. Midaxillary line. A 3 cm palpable abnormality consistent with a lipoma. No skin change.   Cardiovascular Cardiovascular examination reveals -normal heart sounds, regular rate and rhythm with no murmurs and normal pedal pulses bilaterally.  Abdomen Inspection Inspection of the abdomen reveals - No Hernias. Skin - Scar - no surgical scars. Palpation/Percussion Palpation and  Percussion of the abdomen reveal - Soft, Non Tender, No Rebound tenderness, No Rigidity (guarding) and No hepatosplenomegaly. Auscultation Auscultation of the abdomen reveals - Bowel sounds normal.  Neurologic Neurologic evaluation reveals -alert and oriented x 3 with no  impairment of recent or remote memory. Mental Status-Normal.  Musculoskeletal Normal Exam - Left-Upper Extremity Strength Normal and Lower Extremity Strength Normal. Normal Exam - Right-Upper Extremity Strength Normal and Lower Extremity Strength Normal.  Lymphatic Head & Neck  General Head & Neck Lymphatics: Bilateral - Description - Normal. Axillary  General Axillary Region: Bilateral - Description - Normal. Tenderness - Non Tender. Femoral & Inguinal  Generalized Femoral & Inguinal Lymphatics: Bilateral - Description - Normal. Tenderness - Non Tender. Note: no axillary or cervical adenopathy     Assessment & Plan  MASS OF CHEST WALL, LEFT (R22.2)   you have a 2 cm subcutaneous mass of the left chest wall Most likely this is a benign process such as a lipoma It does not appear to be part of the breast or of the lymph node bearing area  We have discussed the option of surgical removal and we have compared that to simple observation You have stated that you would like to have this removed and to have it removed under general anesthesia You have requested that we do this surgery at [facility], where you work That is fine  you'll be scheduled for elective excision of your left chest wall mass under anesthesia electively in the future Dr. Dalbert Batman has discussed the indications, techniques, and risk of the surgery in detail  [place] (M48.02)    Edsel Petrin. Dalbert Batman, M.D., [facility] General and Minimally invasive Surgery Breast and Colorectal Surgery Office:   [phone] Pager:   [phone]

## 2017-03-20 ENCOUNTER — Encounter
Admission: RE | Admit: 2017-03-20 | Discharge: 2017-03-20 | Disposition: A | Discharge status: Home / Self Care | Payer: 59 | Source: Ambulatory Visit | Attending: General Surgery | Admitting: General Surgery

## 2017-03-20 ENCOUNTER — Other Ambulatory Visit: Payer: Self-pay

## 2017-03-20 DIAGNOSIS — M4802 Spinal stenosis, cervical region: Secondary | ICD-10-CM | POA: Diagnosis not present

## 2017-03-20 DIAGNOSIS — M50321 Other cervical disc degeneration at C4-C5 level: Secondary | ICD-10-CM | POA: Diagnosis not present

## 2017-03-20 DIAGNOSIS — M542 Cervicalgia: Secondary | ICD-10-CM | POA: Diagnosis present

## 2017-03-20 DIAGNOSIS — Q7649 Other congenital malformations of spine, not associated with scoliosis: Secondary | ICD-10-CM | POA: Insufficient documentation

## 2017-03-20 DIAGNOSIS — M4602 Spinal enthesopathy, cervical region: Secondary | ICD-10-CM | POA: Diagnosis not present

## 2017-03-20 HISTORY — DX: Unspecified asthma, uncomplicated: J45.909

## 2017-03-20 LAB — CBC
HCT: 40.5 % (ref 35.0–47.0)
Hemoglobin: 13.5 g/dL (ref 12.0–16.0)
MCH: 32.5 pg (ref 26.0–34.0)
MCHC: 33.4 g/dL (ref 32.0–36.0)
MCV: 97.1 fL (ref 80.0–100.0)
Platelets: 211 K/uL (ref 150–440)
RBC: 4.17 MIL/uL (ref 3.80–5.20)
RDW: 14.3 % (ref 11.5–14.5)
WBC: 8 K/uL (ref 3.6–11.0)

## 2017-03-20 NOTE — Patient Instructions (Signed)
Your procedure is scheduled on: Friday 03/23/17 Report to [facility]. To find out your arrival time please call [phone] between 1PM - 3PM on Thursday 03/22/17.  Remember: Instructions that are not followed completely may result in serious medical risk, up to and including death, or upon the discretion of your surgeon and anesthesiologist your surgery may need to be rescheduled.     _X__ 1. Do not eat food after midnight the night before your procedure.                 No gum chewing or hard candies. You may drink clear liquids up to 2 hours                 before you are scheduled to arrive for your surgery- DO not drink clear                 liquids within 2 hours of the start of your surgery.                 Clear Liquids include:  water, apple juice without pulp, clear carbohydrate                 drink such as Clearfast or Gatorade, Black Coffee or Tea (Do not add                 anything to coffee or tea).  __X__2.  On the morning of surgery brush your teeth with toothpaste and water, you                 may rinse your mouth with mouthwash if you wish.  Do not swallow any              toothpaste of mouthwash.     _X__ 3.  No Alcohol for 24 hours before or after surgery.   _X__ 4.  Do Not Smoke or use e-cigarettes For 24 Hours Prior to Your Surgery.                 Do not use any chewable tobacco products for at least 6 hours prior to                 surgery.  ____  5.  Bring all medications with you on the day of surgery if instructed.   __X__  6.  Notify your doctor if there is any change in your medical condition      (cold, fever, infections).     Do not wear jewelry, make-up, hairpins, clips or nail polish. Do not wear lotions, powders, or perfumes.  Do not shave 48 hours prior to surgery. Men may shave face and neck. Do not bring valuables to the hospital.    [facility] is not responsible for any belongings or  valuables.  Contacts, dentures/partials or body piercings may not be worn into surgery. Bring a case for your contacts, glasses or hearing aids, a denture cup will be supplied. Leave your suitcase in the car. After surgery it may be brought to your room. For patients admitted to the hospital, discharge time is determined by your treatment team.   Patients discharged the day of surgery will not be allowed to drive home.   Please read over the following fact sheets that you were given:   MRSA Information  __X__ Take these medicines the morning of surgery with A SIP OF WATER:  1. May take oxycodone if needed for pain  2.   3.   4.  5.  6.  ____ Fleet Enema (as directed)   __X__ Use CHG Soap/SAGE wipes as directed  __X__ Use inhalers on the day of surgery AND BRING WITH YOU  ____ Stop metformin/Janumet/Farxiga 2 days prior to surgery    ____ Take 1/2 of usual insulin dose the night before surgery. No insulin the morning          of surgery.   ____ Stop Blood Thinners Coumadin/Plavix/Xarelto/Pleta/Pradaxa/Eliquis/Effient/Aspirin  on   Or contact your Surgeon, Cardiologist or Medical Doctor regarding  ability to stop your blood thinners  __X__ Stop Anti-inflammatories 7 days before surgery such as Advil, Ibuprofen, Motrin,  BC or Goodies Powder, Naprosyn, Naproxen, Aleve, Aspirin (MAY TAKE TYLENOL IF NEEDED)   __X__ Stop all herbal supplements, fish oil or vitamin E until after surgery.    ____ Bring C-Pap to the hospital.

## 2017-03-22 ENCOUNTER — Ambulatory Visit
Admission: RE | Admit: 2017-03-22 | Discharge: 2017-03-22 | Disposition: A | Discharge status: Home / Self Care | Payer: 59 | Source: Ambulatory Visit | Attending: Student in an Organized Health Care Education/Training Program | Admitting: Student in an Organized Health Care Education/Training Program

## 2017-03-22 DIAGNOSIS — M542 Cervicalgia: Secondary | ICD-10-CM

## 2017-03-22 DIAGNOSIS — M50321 Other cervical disc degeneration at C4-C5 level: Secondary | ICD-10-CM | POA: Diagnosis not present

## 2017-03-22 MED ORDER — CEFAZOLIN SODIUM-DEXTROSE 2-4 GM/100ML-% IV SOLN
2.0000 g | INTRAVENOUS | Status: AC
  Administered 2017-03-23: 2 g via INTRAVENOUS

## 2017-03-23 ENCOUNTER — Encounter: Payer: Self-pay | Admitting: *Deleted

## 2017-03-23 ENCOUNTER — Ambulatory Visit
Admission: RE | Admit: 2017-03-23 | Discharge: 2017-03-23 | Disposition: A | Discharge status: Home / Self Care | Payer: 59 | Source: Ambulatory Visit | Attending: General Surgery | Admitting: General Surgery

## 2017-03-23 ENCOUNTER — Ambulatory Visit: Payer: 59 | Admitting: Anesthesiology

## 2017-03-23 ENCOUNTER — Encounter
Admission: RE | Disposition: A | Discharge status: Home / Self Care | Payer: Self-pay | Source: Ambulatory Visit | Attending: General Surgery

## 2017-03-23 ENCOUNTER — Other Ambulatory Visit: Payer: Self-pay

## 2017-03-23 DIAGNOSIS — J449 Chronic obstructive pulmonary disease, unspecified: Secondary | ICD-10-CM | POA: Diagnosis not present

## 2017-03-23 DIAGNOSIS — F17210 Nicotine dependence, cigarettes, uncomplicated: Secondary | ICD-10-CM | POA: Diagnosis not present

## 2017-03-23 DIAGNOSIS — R222 Localized swelling, mass and lump, trunk: Secondary | ICD-10-CM | POA: Diagnosis present

## 2017-03-23 DIAGNOSIS — D171 Benign lipomatous neoplasm of skin and subcutaneous tissue of trunk: Secondary | ICD-10-CM | POA: Diagnosis not present

## 2017-03-23 HISTORY — PX: MASS EXCISION: SHX2000

## 2017-03-23 HISTORY — DX: Benign lipomatous neoplasm of skin and subcutaneous tissue of trunk: D17.1

## 2017-03-23 SURGERY — EXCISION MASS
Anesthesia: General | Site: Chest | Laterality: Left | Wound class: Clean

## 2017-03-23 MED ORDER — LIDOCAINE HCL (PF) 2 % IJ SOLN
INTRAMUSCULAR | Status: AC
  Filled 2017-03-23: qty 10

## 2017-03-23 MED ORDER — FENTANYL CITRATE (PF) 100 MCG/2ML IJ SOLN: 25.0000 ug | INTRAMUSCULAR | Status: DC | PRN

## 2017-03-23 MED ORDER — MIDAZOLAM HCL 2 MG/2ML IJ SOLN
INTRAMUSCULAR | Status: DC | PRN
  Administered 2017-03-23: 2 mg via INTRAVENOUS

## 2017-03-23 MED ORDER — CEFAZOLIN SODIUM-DEXTROSE 2-4 GM/100ML-% IV SOLN
INTRAVENOUS | Status: AC
  Filled 2017-03-23: qty 100

## 2017-03-23 MED ORDER — GABAPENTIN 300 MG PO CAPS
ORAL_CAPSULE | ORAL | Status: AC
  Filled 2017-03-23: qty 1

## 2017-03-23 MED ORDER — PROPOFOL 10 MG/ML IV BOLUS
INTRAVENOUS | Status: AC
  Filled 2017-03-23: qty 20

## 2017-03-23 MED ORDER — BUPIVACAINE HCL (PF) 0.25 % IJ SOLN
INTRAMUSCULAR | Status: AC
  Filled 2017-03-23: qty 30

## 2017-03-23 MED ORDER — GABAPENTIN 300 MG PO CAPS
300.0000 mg | ORAL_CAPSULE | ORAL | Status: AC
  Administered 2017-03-23: 300 mg via ORAL

## 2017-03-23 MED ORDER — SODIUM CHLORIDE 0.9% FLUSH: 3.0000 mL | INTRAVENOUS | Status: DC | PRN

## 2017-03-23 MED ORDER — ACETAMINOPHEN 325 MG PO TABS: 650.0000 mg | ORAL_TABLET | ORAL | Status: DC | PRN

## 2017-03-23 MED ORDER — SUGAMMADEX SODIUM 200 MG/2ML IV SOLN
INTRAVENOUS | Status: DC | PRN
  Administered 2017-03-23: 200 mg via INTRAVENOUS

## 2017-03-23 MED ORDER — OXYCODONE HCL 5 MG PO TABS: 5.0000 mg | ORAL_TABLET | Freq: Once | ORAL | Status: DC | PRN

## 2017-03-23 MED ORDER — CELECOXIB 200 MG PO CAPS
ORAL_CAPSULE | ORAL | Status: AC
  Filled 2017-03-23: qty 1

## 2017-03-23 MED ORDER — PROPOFOL 10 MG/ML IV BOLUS
INTRAVENOUS | Status: DC | PRN
  Administered 2017-03-23: 120 mg via INTRAVENOUS

## 2017-03-23 MED ORDER — ROCURONIUM BROMIDE 50 MG/5ML IV SOLN
INTRAVENOUS | Status: AC
  Filled 2017-03-23: qty 1

## 2017-03-23 MED ORDER — FAMOTIDINE 20 MG PO TABS
20.0000 mg | ORAL_TABLET | Freq: Once | ORAL | Status: AC
  Administered 2017-03-23: 20 mg via ORAL

## 2017-03-23 MED ORDER — FENTANYL CITRATE (PF) 100 MCG/2ML IJ SOLN
INTRAMUSCULAR | Status: AC
  Filled 2017-03-23: qty 2

## 2017-03-23 MED ORDER — ACETAMINOPHEN 500 MG PO TABS
ORAL_TABLET | ORAL | Status: AC
  Filled 2017-03-23: qty 2

## 2017-03-23 MED ORDER — LACTATED RINGERS IV SOLN
INTRAVENOUS | Status: DC
  Administered 2017-03-23: 08:00:00 via INTRAVENOUS

## 2017-03-23 MED ORDER — LIDOCAINE HCL (CARDIAC) 20 MG/ML IV SOLN
INTRAVENOUS | Status: DC | PRN
  Administered 2017-03-23: 60 mg via INTRAVENOUS

## 2017-03-23 MED ORDER — OXYCODONE HCL 5 MG/5ML PO SOLN: 5.0000 mg | Freq: Once | ORAL | Status: DC | PRN

## 2017-03-23 MED ORDER — OXYCODONE HCL 5 MG PO TABS
5.0000 mg | ORAL_TABLET | ORAL | Status: DC | PRN
  Filled 2017-03-23: qty 2

## 2017-03-23 MED ORDER — SODIUM CHLORIDE 0.9% FLUSH: 3.0000 mL | Freq: Two times a day (BID) | INTRAVENOUS | Status: DC

## 2017-03-23 MED ORDER — LACTATED RINGERS IV SOLN: INTRAVENOUS | Status: DC

## 2017-03-23 MED ORDER — FENTANYL CITRATE (PF) 100 MCG/2ML IJ SOLN
INTRAMUSCULAR | Status: DC | PRN
  Administered 2017-03-23: 50 ug via INTRAVENOUS

## 2017-03-23 MED ORDER — ONDANSETRON HCL 4 MG/2ML IJ SOLN
INTRAMUSCULAR | Status: DC | PRN
  Administered 2017-03-23: 4 mg via INTRAVENOUS

## 2017-03-23 MED ORDER — EPINEPHRINE PF 1 MG/ML IJ SOLN
INTRAMUSCULAR | Status: AC
  Filled 2017-03-23: qty 1

## 2017-03-23 MED ORDER — ROCURONIUM BROMIDE 100 MG/10ML IV SOLN
INTRAVENOUS | Status: DC | PRN
  Administered 2017-03-23: 40 mg via INTRAVENOUS

## 2017-03-23 MED ORDER — CHLORHEXIDINE GLUCONATE CLOTH 2 % EX PADS: 6.0000 | MEDICATED_PAD | Freq: Once | CUTANEOUS | Status: DC

## 2017-03-23 MED ORDER — BUPIVACAINE-EPINEPHRINE 0.25% -1:200000 IJ SOLN
INTRAMUSCULAR | Status: DC | PRN
  Administered 2017-03-23: 20 mL

## 2017-03-23 MED ORDER — ACETAMINOPHEN 650 MG RE SUPP
650.0000 mg | RECTAL | Status: DC | PRN
  Filled 2017-03-23: qty 1

## 2017-03-23 MED ORDER — MIDAZOLAM HCL 2 MG/2ML IJ SOLN
INTRAMUSCULAR | Status: AC
  Filled 2017-03-23: qty 2

## 2017-03-23 MED ORDER — CELECOXIB 200 MG PO CAPS
200.0000 mg | ORAL_CAPSULE | ORAL | Status: AC
  Administered 2017-03-23: 200 mg via ORAL

## 2017-03-23 MED ORDER — FAMOTIDINE 20 MG PO TABS
ORAL_TABLET | ORAL | Status: AC
  Filled 2017-03-23: qty 1

## 2017-03-23 MED ORDER — SODIUM CHLORIDE 0.9 % IV SOLN: 250.0000 mL | INTRAVENOUS | Status: DC | PRN

## 2017-03-23 MED ORDER — ACETAMINOPHEN 500 MG PO TABS
1000.0000 mg | ORAL_TABLET | ORAL | Status: AC
  Administered 2017-03-23: 1000 mg via ORAL

## 2017-03-23 SURGICAL SUPPLY — 31 items
BLADE SURG 15 STRL LF DISP TIS (BLADE) ×1 IMPLANT
BLADE SURG 15 STRL SS (BLADE) ×2
BLADE SURG SZ10 CARB STEEL (BLADE) ×3 IMPLANT
CHLORAPREP W/TINT 26ML (MISCELLANEOUS) ×6 IMPLANT
DERMABOND ADVANCED (GAUZE/BANDAGES/DRESSINGS) ×2
DERMABOND ADVANCED .7 DNX12 (GAUZE/BANDAGES/DRESSINGS) ×1 IMPLANT
DRAPE LAPAROSCOPIC ABDOMINAL (DRAPES) ×3 IMPLANT
DRAPE POUCH INSTRU U-SHP 10X18 (DRAPES) ×3 IMPLANT
ELECT CAUTERY BLADE 6.4 (BLADE) ×3 IMPLANT
ELECT REM PT RETURN 9FT ADLT (ELECTROSURGICAL) ×3
ELECTRODE REM PT RTRN 9FT ADLT (ELECTROSURGICAL) ×1 IMPLANT
GAUZE SPONGE 4X4 12PLY STRL (GAUZE/BANDAGES/DRESSINGS) ×3 IMPLANT
GAUZE SPONGE 4X4 16PLY XRAY LF (GAUZE/BANDAGES/DRESSINGS) ×3 IMPLANT
GLOVE BIOGEL PI IND STRL 7.0 (GLOVE) ×1 IMPLANT
GLOVE BIOGEL PI INDICATOR 7.0 (GLOVE) ×2
GLOVE EUDERMIC 7 POWDERFREE (GLOVE) ×3 IMPLANT
GOWN STRL REUS W/TWL LRG LVL3 (GOWN DISPOSABLE) ×3 IMPLANT
GOWN STRL REUS W/TWL XL LVL3 (GOWN DISPOSABLE) ×6 IMPLANT
MARKER SKIN DUAL TIP RULER LAB (MISCELLANEOUS) ×3 IMPLANT
NEEDLE HYPO 22GX1.5 SAFETY (NEEDLE) ×3 IMPLANT
PACK BASIN MINOR ARMC (MISCELLANEOUS) ×3 IMPLANT
PENCIL ELECTRO HAND CTR (MISCELLANEOUS) ×3 IMPLANT
SUT MNCRL 4-0 (SUTURE) ×2
SUT MNCRL 4-0 27XMFL (SUTURE) ×1
SUT VIC AB 3-0 SH 27 (SUTURE)
SUT VIC AB 3-0 SH 27X BRD (SUTURE) IMPLANT
SUT VIC AB 3-0 SH 8-18 (SUTURE) IMPLANT
SUT VICRYL 3-0 CR8 SH (SUTURE) ×3 IMPLANT
SUTURE MNCRL 4-0 27XMF (SUTURE) ×1 IMPLANT
SYR CONTROL 10ML LL (SYRINGE) ×3 IMPLANT
TOWEL OR 17X26 4PK STRL BLUE (TOWEL DISPOSABLE) ×6 IMPLANT

## 2017-03-23 NOTE — Anesthesia Post-op Follow-up Note (Signed)
Anesthesia QCDR form completed.        

## 2017-03-23 NOTE — Anesthesia Preprocedure Evaluation (Signed)
Anesthesia Evaluation  Patient identified by MRN, date of birth, ID band Patient awake    Reviewed: Allergy & Precautions, H&P , NPO status , Patient's Chart, lab work & pertinent test results  History of Anesthesia Complications Negative for: history of anesthetic complications  Airway Mallampati: III  TM Distance: <3 FB Neck ROM: full    Dental  (+) Chipped, Poor Dentition   Pulmonary asthma , COPD, Current Smoker,           Cardiovascular Exercise Tolerance: Good (-) angina(-) Past MI and (-) DOE negative cardio ROS       Neuro/Psych  Neuromuscular disease negative psych ROS   GI/Hepatic negative GI ROS, Neg liver ROS, neg GERD  ,  Endo/Other  negative endocrine ROS  Renal/GU      Musculoskeletal  (+) Arthritis ,   Abdominal   Peds  Hematology negative hematology ROS (+)   Anesthesia Other Findings Past Medical History: No date: Allergy No date: Arthritis No date: Asthma No date: Spinal stenosis of cervical region  Past Surgical History: No date: BREAST SURGERY; Right     Comment:  benign tumor No date: BUNIONECTOMY No date: CESAREAN SECTION No date: DILATION AND CURETTAGE OF UTERUS No date: GANGLION CYST EXCISION  BMI    Body Mass Index:  24.24 kg/m      Reproductive/Obstetrics negative OB ROS                             Anesthesia Physical Anesthesia Plan  ASA: III  Anesthesia Plan: General ETT   Post-op Pain Management:    Induction: Intravenous  PONV Risk Score and Plan: Ondansetron, Dexamethasone and Midazolam  Airway Management Planned: Oral ETT  Additional Equipment:   Intra-op Plan:   Post-operative Plan: Extubation in OR  Informed Consent: I have reviewed the patients History and Physical, chart, labs and discussed the procedure including the risks, benefits and alternatives for the proposed anesthesia with the patient or authorized  representative who has indicated his/her understanding and acceptance.   Dental Advisory Given  Plan Discussed with: Anesthesiologist, CRNA and Surgeon  Anesthesia Plan Comments: (Patient consented for risks of anesthesia including but not limited to:  - adverse reactions to medications - damage to teeth, lips or other oral mucosa - sore throat or hoarseness - Damage to heart, brain, lungs or loss of life  Patient voiced understanding.)        Anesthesia Quick Evaluation

## 2017-03-23 NOTE — Op Note (Signed)
Patient Name:           Tammy Lang   Date of Surgery:        03/23/2017  Pre op Diagnosis:      Left chest wall mass, 3 cm, lipoma suspected  Post op Diagnosis:    same  Procedure:                 Excision 3 cm left chest wall mass, deep subcutaneous  Surgeon:                     Edsel Petrin. Dalbert Batman, M.D., FACS  Assistant:                      OR staff  Operative Indications:   this is a pleasant 52 year old female, referred by Webb Silversmith, NP at [facility] for evaluation of a left chest wall mass.   Following evaluation at her primary care office an ultrasound was performed on January 11, 2016. This shows a 3.3 x 0.9 x 4.5 cm nodule in the left lateral chest which correlates with the palpable abnormality. 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       Exam confirmed a palpable mass in the left lateral chest wall under the bra strap. Most likely a lipoma She'll be scheduled for excision of left chest wall mass under anesthesia.She agrees with this plan.    Operative Findings:       There was an encapsulated,multilobulated lipomatous mass in the left chest wall going all the way down to the muscle fascia. This was excised in its entirety and sent to pathology.  Grossly this is benign.  Procedure in Detail:          Following the induction of general endotracheal anesthesia the patient was placed in a right lateral decubitus position and the left chest wall was prepped and draped in a sterile fashion.  Intravenous antibiotics were given.  Surgical timeout was performed.  0.25% Marcaine with epinephrine was used as local infiltration anesthetic.  A 2.5 cm linear incision was made.  Dissection was carried down through the subcutaneous tissue.  The lipoma was encountered and dissected away with sharp dissection and electrocautery.  It was felt to be a complete excision.  Hemostasis was excellent.  The wound was irrigated.  The subcutaneous tissue was closed with 3-0 Vicryl  sutures and the skin closed with a running subcutaneous taken of 4-0 monocryl. And dermabond..  Patient tolerated the procedure well was taken to PACU in stable condition.  EBL 5 mL.  Counts correct.  Complications none.     Edsel Petrin. Dalbert Batman, M.D., FACS General and Minimally Invasive Surgery Breast and Colorectal Surgery  03/23/2017 8:17 AM

## 2017-03-23 NOTE — Discharge Instructions (Addendum)
Ice pack to left chest wall, 10 minutes at a time, intermittently, for 24 hours  Okay to shower starting tomorrow night No tub baths  You have lots of pain medicine at home because of your chronic neck pain, so no new prescriptions were written  the clear plastic superglue should wear off in about 3 weeks  No contact sports for 3 weeks Void dirty or sweaty activities for 2-3 weeks  You may return to work next week if you wish.    AMBULATORY SURGERY  DISCHARGE INSTRUCTIONS   1) The drugs that you were given will stay in your system until tomorrow so for the next 24 hours you should not:  A) Drive an automobile B) Make any legal decisions C) Drink any alcoholic beverage   2) You may resume regular meals tomorrow.  Today it is better to start with liquids and gradually work up to solid foods.  You may eat anything you prefer, but it is better to start with liquids, then soup and crackers, and gradually work up to solid foods.   3) Please notify your doctor immediately if you have any unusual bleeding, trouble breathing, redness and pain at the surgery site, drainage, fever, or pain not relieved by medication.    4) Additional Instructions:        Please contact your physician with any problems or Same Day Surgery at [phone], Monday through Friday 6 am to 4 pm, or [place] at [facility] number at [phone].

## 2017-03-23 NOTE — Anesthesia Postprocedure Evaluation (Signed)
Anesthesia Post Note  Patient: Tammy Lang  Procedure(s) Performed: EXCISION OF LEFT CHEST WALL MASS (Left Chest)  Patient location during evaluation: PACU Anesthesia Type: General Level of consciousness: awake and alert Pain management: pain level controlled Vital Signs Assessment: post-procedure vital signs reviewed and stable Respiratory status: spontaneous breathing, nonlabored ventilation, respiratory function stable and patient connected to nasal cannula oxygen Cardiovascular status: blood pressure returned to baseline and stable Postop Assessment: no apparent nausea or vomiting Anesthetic complications: no     Last Vitals:  Vitals:   03/23/17 0911 03/23/17 0927  BP: (!) 157/89 (!) 159/94  Pulse: 75 87  Resp: 16 16  Temp: (!) 36.1 C (!) 36.4 C  SpO2: 98% 100%    Last Pain:  Vitals:   03/23/17 0927  TempSrc: Temporal  PainSc:                  Precious Haws Letizia Hook

## 2017-03-23 NOTE — Progress Notes (Signed)
Dr piscitello aware of blood pressure 136/98 no new orders

## 2017-03-23 NOTE — Anesthesia Procedure Notes (Signed)
Procedure Name: Intubation Date/Time: 03/23/2017 7:40 AM Performed by: Allean Found, CRNA Pre-anesthesia Checklist: Patient identified, Emergency Drugs available, Suction available, Patient being monitored and Timeout performed Patient Re-evaluated:Patient Re-evaluated prior to induction Oxygen Delivery Method: Circle system utilized Preoxygenation: Pre-oxygenation with 100% oxygen Induction Type: IV induction Ventilation: Mask ventilation without difficulty Laryngoscope Size: Mac and 3 Grade View: Grade II Tube size: 7.0 mm Number of attempts: 1 Placement Confirmation: ETT inserted through vocal cords under direct vision,  positive ETCO2 and breath sounds checked- equal and bilateral Secured at: 22 cm Tube secured with: Tape Dental Injury: Teeth and Oropharynx as per pre-operative assessment

## 2017-03-23 NOTE — Transfer of Care (Signed)
Immediate Anesthesia Transfer of Care Note  Patient: Tammy Lang  Procedure(s) Performed: EXCISION OF LEFT CHEST WALL MASS (Left Chest)  Patient Location: PACU  Anesthesia Type:General  Level of Consciousness: awake  Airway & Oxygen Therapy: Patient Spontanous Breathing and Patient connected to face mask oxygen  Post-op Assessment: Report given to RN and Post -op Vital signs reviewed and stable  Post vital signs: Reviewed and stable  Last Vitals:  Vitals:   03/23/17 0615  BP: (!) 140/98  Pulse: 92  Resp: 14  Temp: 36.4 C  SpO2: 98%    Last Pain:  Vitals:   03/23/17 0615  TempSrc: Oral  PainSc: 7       Patients Stated Pain Goal: 3 (70/14/10 3013)  Complications: No apparent anesthesia complications

## 2017-03-23 NOTE — Interval H&P Note (Signed)
History and Physical Interval Note:  03/23/2017 5:58 AM  Tammy Lang  has presented today for surgery, with the diagnosis of LEFT CHEST WALL MASS  The various methods of treatment have been discussed with the patient and family. After consideration of risks, benefits and other options for treatment, the patient has consented to  Procedure(s): EXCISION OF LEFT CHEST WALL MASS (Left) as a surgical intervention .  The patient's history has been reviewed, patient examined, no change in status, stable for surgery.  I have reviewed the patient's chart and labs.  Questions were answered to the patient's satisfaction.     Adin Hector

## 2017-03-26 LAB — SURGICAL PATHOLOGY

## 2017-03-26 NOTE — Progress Notes (Signed)
Inform patient of Pathology report,. Tell her the chest wall mass is a benign lipoma. Let me know you reached her. Thanks.  hmi

## 2017-03-29 ENCOUNTER — Encounter: Payer: 59 | Admitting: Student in an Organized Health Care Education/Training Program

## 2017-04-03 ENCOUNTER — Ambulatory Visit
Payer: 59 | Attending: Student in an Organized Health Care Education/Training Program | Admitting: Student in an Organized Health Care Education/Training Program

## 2017-04-03 ENCOUNTER — Encounter: Payer: Self-pay | Admitting: Student in an Organized Health Care Education/Training Program

## 2017-04-03 VITALS — BP 149/99 | HR 98 | Temp 98.6°F | Resp 16 | Ht 59.0 in | Wt 120.0 lb

## 2017-04-03 DIAGNOSIS — G894 Chronic pain syndrome: Secondary | ICD-10-CM | POA: Diagnosis not present

## 2017-04-03 DIAGNOSIS — F17210 Nicotine dependence, cigarettes, uncomplicated: Secondary | ICD-10-CM | POA: Insufficient documentation

## 2017-04-03 DIAGNOSIS — M7989 Other specified soft tissue disorders: Secondary | ICD-10-CM | POA: Insufficient documentation

## 2017-04-03 DIAGNOSIS — M25522 Pain in left elbow: Secondary | ICD-10-CM | POA: Insufficient documentation

## 2017-04-03 DIAGNOSIS — M25521 Pain in right elbow: Secondary | ICD-10-CM | POA: Diagnosis not present

## 2017-04-03 DIAGNOSIS — M542 Cervicalgia: Secondary | ICD-10-CM | POA: Diagnosis present

## 2017-04-03 DIAGNOSIS — M4722 Other spondylosis with radiculopathy, cervical region: Secondary | ICD-10-CM | POA: Diagnosis not present

## 2017-04-03 DIAGNOSIS — M47812 Spondylosis without myelopathy or radiculopathy, cervical region: Secondary | ICD-10-CM

## 2017-04-03 DIAGNOSIS — M50121 Cervical disc disorder at C4-C5 level with radiculopathy: Secondary | ICD-10-CM | POA: Insufficient documentation

## 2017-04-03 DIAGNOSIS — M4802 Spinal stenosis, cervical region: Secondary | ICD-10-CM | POA: Insufficient documentation

## 2017-04-03 DIAGNOSIS — M7918 Myalgia, other site: Secondary | ICD-10-CM | POA: Diagnosis not present

## 2017-04-03 DIAGNOSIS — G8929 Other chronic pain: Secondary | ICD-10-CM

## 2017-04-03 DIAGNOSIS — G5603 Carpal tunnel syndrome, bilateral upper limbs: Secondary | ICD-10-CM | POA: Insufficient documentation

## 2017-04-03 DIAGNOSIS — Z79899 Other long term (current) drug therapy: Secondary | ICD-10-CM | POA: Diagnosis not present

## 2017-04-03 DIAGNOSIS — Q7649 Other congenital malformations of spine, not associated with scoliosis: Secondary | ICD-10-CM | POA: Diagnosis not present

## 2017-04-03 MED ORDER — OXYCODONE-ACETAMINOPHEN 10-325 MG PO TABS: 1.0000 | ORAL_TABLET | Freq: Three times a day (TID) | ORAL | 0 refills | Status: DC | PRN

## 2017-04-03 MED ORDER — AMITRIPTYLINE HCL 25 MG PO TABS: ORAL_TABLET | ORAL | 0 refills | Status: DC

## 2017-04-03 NOTE — Progress Notes (Signed)
Patient's Name: Tammy Lang  MRN: [id number]  Referring Provider: Jearld Fenton, NP  DOB: 1965/05/06  PCP: Jearld Fenton, NP  DOS: 04/03/2017  Note by: Gillis Santa, MD  Service setting: Ambulatory outpatient  Specialty: Interventional Pain Management  Location: [facility] (AMB) Pain Management Facility    Patient type: Established   Primary Reason(s) for Visit: Encounter for prescription drug management. (Level of risk: moderate)  CC: Neck Pain (bilateral) and Joint Swelling (bilateral)  HPI  Ms. Tammy Lang is a 52 y.o. year old, female patient, who comes today for a medication management evaluation. She has Osteoarthritis of spine with radiculopathy, cervical region; Seasonal allergies; Spondylosis of cervical region without myelopathy or radiculopathy; Neck pain; Chronic pain syndrome; Bilateral carpal tunnel syndrome; Chronic pain of right elbow; Myofascial pain; and Lipoma of chest wall on their problem list. Her primarily concern today is the Neck Pain (bilateral) and Joint Swelling (bilateral)  Pain Assessment: Location: Left, Right(bilateral) Neck(elbows) Radiating: into shoulders and arms.  Onset: More than a month ago Duration: Chronic pain Quality: Discomfort, Aching, Squeezing, Stabbing, Tender, Sharp, Pressure Severity: 7 /10 (self-reported pain score)  Note: Reported level is compatible with observation.                         When using our objective Pain Scale, levels between 6 and 10/10 are said to belong in an emergency room, as it progressively worsens from a 6/10, described as severely limiting, requiring emergency care not usually available at an outpatient pain management facility. At a 6/10 level, communication becomes difficult and requires great effort. Assistance to reach the emergency department may be required. Facial flushing and profuse sweating along with potentially dangerous increases in heart rate and blood pressure will be evident. Effect on ADL: limited ROM  and numbness and tingling in hands. weakness in grip Timing: Constant Modifying factors: procedures and medications  Ms. Tammy Lang was last scheduled for an appointment on 03/13/2017 for medication management. During today's appointment we reviewed Ms. Tammy Lang's chronic pain status, as well as her outpatient medication regimen.  The patient  reports that she does not use drugs. Her body mass index is 24.24 kg/m.  Further details on both, my assessment(s), as well as the proposed treatment plan, please see below.   Controlled Substance Pharmacotherapy Assessment REMS (Risk Evaluation and Mitigation Strategy)  Analgesic: Percocet 10 mg 3 times daily as needed, quantity 90 MME/day: 45 mg/day.  Tammy Billow, RN  04/03/2017  8:14 AM  Sign at close encounter Nursing Pain Medication Assessment:  Safety precautions to be maintained throughout the outpatient stay will include: orient to surroundings, keep bed in low position, maintain call bell within reach at all times, provide assistance with transfer out of bed and ambulation.  Medication Inspection Compliance: Ms. Tammy Lang did not comply with our request to bring her pills to be counted. She was reminded that bringing the medication bottles, even when empty, is a requirement.  Medication: None brought in. Pill/Patch Count: None available to be counted. Bottle Appearance: No container available. Did not bring bottle(s) to appointment. Filled Date: N/A Last Medication intake:  Yesterday   Patient counseled on importance of bringing medicine with her in the bottle that it was filled in for count.     Pharmacokinetics: Liberation and absorption (onset of action): WNL Distribution (time to peak effect): WNL Metabolism and excretion (duration of action): WNL         Pharmacodynamics: Desired effects: Analgesia: Ms.  Tammy Lang reports >50% benefit. Functional ability: Patient reports that medication allows her to accomplish basic  ADLs Clinically meaningful improvement in function (CMIF): Sustained CMIF goals met Perceived effectiveness: Described as relatively effective, allowing for increase in activities of daily living (ADL) Undesirable effects: Side-effects or Adverse reactions: None reported Monitoring: [place] PMP: Online review of the past 46-monthperiod conducted. Compliant with practice rules and regulations Last UDS on record: Summary  Date Value Ref Range Status  10/17/2016 FINAL  Final    Comment:    ==================================================================== [facility] COMP DRUG ANALYSIS,UR ==================================================================== Test                             Result       Flag       Units Drug Present and Declared for Prescription Verification   Oxycodone                      470          EXPECTED   ng/mg creat   Oxymorphone                    750          EXPECTED   ng/mg creat   Noroxycodone                   1621         EXPECTED   ng/mg creat   Noroxymorphone                 257          EXPECTED   ng/mg creat    Sources of oxycodone are scheduled prescription medications.    Oxymorphone, noroxycodone, and noroxymorphone are expected    metabolites of oxycodone. Oxymorphone is also available as a    scheduled prescription medication.   Baclofen                       PRESENT      EXPECTED   Acetaminophen                  PRESENT      EXPECTED   Diphenhydramine                PRESENT      EXPECTED Drug Present not Declared for Prescription Verification   Gabapentin                     PRESENT      UNEXPECTED   Trazodone                      PRESENT      UNEXPECTED   1,3 chlorophenyl piperazine    PRESENT      UNEXPECTED    1,3-chlorophenyl piperazine is an expected metabolite of    trazodone.   Naproxen                       PRESENT      UNEXPECTED Drug Absent but Declared for Prescription Verification   Duloxetine                     Not Detected  UNEXPECTED   Diclofenac                     Not  Detected UNEXPECTED    Diclofenac, as indicated in the declared medication list, is not    always detected even when used as directed.   Lidocaine                      Not Detected UNEXPECTED    Lidocaine, as indicated in the declared medication list, is not    always detected even when used as directed. ==================================================================== Test                      Result    Flag   Units      Ref Range   Creatinine              159              mg/dL      >=20 ==================================================================== Declared Medications:  The flagging and interpretation on this report are based on the  following declared medications.  Unexpected results may arise from  inaccuracies in the declared medications.  **Note: The testing scope of this panel includes these medications:  Baclofen (Lioresal)  Diphenhydramine (Benadryl)  Duloxetine (Cymbalta)  Oxycodone (Percocet)  **Note: The testing scope of this panel does not include small to  moderate amounts of these reported medications:  Acetaminophen (Percocet)  Diclofenac (Voltaren)  Lidocaine (Xylocaine)  **Note: The testing scope of this panel does not include following  reported medications:  Topical ==================================================================== For clinical consultation, please call [phone]. ====================================================================    UDS interpretation: Compliant          Medication Assessment Form: Reviewed. Patient indicates being compliant with therapy Treatment compliance: Compliant Risk Assessment Profile: Aberrant behavior: See prior evaluations. None observed or detected today Comorbid factors increasing risk of overdose: See prior notes. No additional risks detected today Risk of substance use disorder (SUD): Low Opioid Risk Tool - 02/28/17 1215      Family History  of Substance Abuse   Alcohol  Negative    Illegal Drugs  Negative    Rx Drugs  Negative      Personal History of Substance Abuse   Alcohol  Negative    Illegal Drugs  Negative    Rx Drugs  Negative      Age   Age between 52-45 years   No      Psychological Disease   Psychological Disease  Negative    Depression  Negative      Total Score   Opioid Risk Tool Scoring  0    Opioid Risk Interpretation  Low Risk      ORT Scoring interpretation table:  Score <3 = Low Risk for SUD  Score between 4-7 = Moderate Risk for SUD  Score >8 = High Risk for Opioid Abuse   Risk Mitigation Strategies:  Patient Counseling: Covered Patient-Prescriber Agreement (PPA): Present and active  Notification to other healthcare providers: Done  Pharmacologic Plan: No change in therapy, at this time.             Laboratory Chemistry  Inflammation Markers (CRP: Acute Phase) (ESR: Chronic Phase) No results found for: CRP, ESRSEDRATE, LATICACIDVEN                       Rheumatology Markers No results found for: RF, ANA, LABURIC, URICUR, LYMEIGGIGMAB, LYMEABIGMQN              Renal Function Markers No results found for: BUN, CREATININE, GFRAA, GFRNONAA  Hepatic Function Markers No results found for: AST, ALT, ALBUMIN, ALKPHOS, HCVAB, AMYLASE, LIPASE, AMMONIA               Electrolytes No results found for: NA, K, CL, CALCIUM, MG, PHOS                      Neuropathy Markers No results found for: VITAMINB12, FOLATE, HGBA1C, HIV               Bone Pathology Markers No results found for: VD25OH, [id number], [id number], HT9774FS2, 25OHVITD1, 25OHVITD2, 25OHVITD3, TESTOFREE, TESTOSTERONE                       Coagulation Parameters Lab Results  Component Value Date   PLT 211 03/20/2017                 Cardiovascular Markers Lab Results  Component Value Date   HGB 13.5 03/20/2017   HCT 40.5 03/20/2017                 CA Markers No results found for: CEA, CA125, LABCA2                Note: Lab results reviewed.  Recent Diagnostic Imaging Results  MR CERVICAL SPINE WO CONTRAST CLINICAL DATA:  Cervicalgia and radiculitis  EXAM: MRI CERVICAL SPINE WITHOUT CONTRAST  TECHNIQUE: Multiplanar, multisequence MR imaging of the cervical spine was performed. No intravenous contrast was administered.  COMPARISON:  None.  FINDINGS: Alignment: 3 mm retrolisthesis C4-5. Mild retrolisthesis C5-6 and C6-7. 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 Negative for fracture or mass. Normal bone marrow  Cord: Normal signal and morphology  Posterior Fossa, vertebral arteries, paraspinal tissues: Negative  Disc levels:  C2-3: Tiny central disc protrusion.  Negative for stenosis  C3-4: Congenital fusion.  Negative for stenosis  C4-5: 3 mm retrolisthesis. Moderate disc degeneration and diffuse uncinate spurring causing moderate foraminal stenosis right greater than left. Mild spinal stenosis.  C5-6: Mild retrolisthesis. Mild disc degeneration and spurring. Mild foraminal stenosis bilaterally  C6-7: Mild retrolisthesis. Disc degeneration and bilateral uncinate spurring. Mild foraminal narrowing bilaterally.  C7-T1: Mild anterolisthesis. Negative for stenosis. Bilateral facet degeneration.  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 Mild spinal stenosis and moderate foraminal stenosis right greater than left  Mild foraminal narrowing bilaterally C5-6 and C6-7 due to spurring.  Electronically Signed   By: Franchot Gallo M.D.   On: 03/23/2017 07:22  Complexity Note: Imaging results reviewed. Results shared with Ms. Montellano, using Layman's terms. Today I personally and independently reviewed the study images pertinent to Ms. Lesser's problem.                  Meds   Current Outpatient Medications:  .  albuterol (PROVENTIL HFA;VENTOLIN HFA) 108 (90 Base) MCG/ACT inhaler, Inhale 2 puffs into the  lungs every 6 (six) hours as needed for wheezing or shortness of breath., Disp: 1 Inhaler, Rfl: 1 .  celecoxib (CELEBREX) 200 MG capsule, Take 1 capsule (200 mg total) by mouth daily., Disp: 30 capsule, Rfl: 4 .  clotrimazole-betamethasone (LOTRISONE) cream, Apply 1 application topically 2 (two) times daily. For up to 2 weeks, Disp: 45 g, Rfl: 0 .  diphenhydrAMINE (BENADRYL) 25 MG tablet, Take 25 mg by mouth at bedtime. , Disp: , Rfl:  .  Multiple Vitamin (MULTIVITAMIN WITH MINERALS) TABS tablet, Take 1 tablet by mouth daily., Disp: , Rfl:  .  Multiple Vitamins-Minerals (HAIR SKIN AND NAILS FORMULA) TABS, Take 1 tablet by mouth daily., Disp: , Rfl:  .  oxyCODONE-acetaminophen (PERCOCET) 10-325 MG tablet, Take 1 tablet by mouth every 8 (eight) hours as needed for pain. For chronic pain To last for 30 days from fill date To be filled on or after: 04/12/17, 05/11/17, Disp: 90 tablet, Rfl: 0 .  tiZANidine (ZANAFLEX) 4 MG tablet, Take 1 tablet (4 mg total) by mouth 3 (three) times daily., Disp: 90 tablet, Rfl: 1 .  amitriptyline (ELAVIL) 25 MG tablet, Take 1 tablet (25 mg total) by mouth at bedtime for 14 days, THEN 2 tablets (50 mg total) at bedtime., Disp: 194 tablet, Rfl: 0  ROS  Constitutional: Denies any fever or chills Gastrointestinal: No reported hemesis, hematochezia, vomiting, or acute GI distress Musculoskeletal: Denies any acute onset joint swelling, redness, loss of ROM, or weakness Neurological: No reported episodes of acute onset apraxia, aphasia, dysarthria, agnosia, amnesia, paralysis, loss of coordination, or loss of consciousness  Allergies  Ms. Obi is allergic to duloxetine.  PFSH  Drug: Ms. Groll  reports that she does not use drugs. Alcohol:  reports that she drinks alcohol. Tobacco:  reports that she has been smoking cigarettes.  She has a 15.00 pack-year smoking history. She has never used smokeless tobacco. Medical:  has a past medical history of Allergy, Arthritis,  Asthma, Lipoma of chest wall (03/23/2017), and Spinal stenosis of cervical region. Surgical: Ms. Macrae  has a past surgical history that includes Cesarean section; Bunionectomy; Ganglion cyst excision; Breast surgery (Right); Dilation and curettage of uterus; and Mass excision (Left, 03/23/2017). Family: family history includes Alcohol abuse in her mother; Cervical cancer in her mother; Hodgkin's lymphoma in her sister.  Constitutional Exam  General appearance: Well nourished, well developed, and well hydrated. In no apparent acute distress Vitals:   04/03/17 0808  BP: (!) 149/99  Pulse: 98  Resp: 16  Temp: 98.6 F (37 C)  TempSrc: Oral  SpO2: 98%  Weight: 120 lb (54.4 kg)  Height: 4' 11"  (1.499 m)   BMI Assessment: Estimated body mass index is 24.24 kg/m as calculated from the following:   Height as of this encounter: 4' 11"  (1.499 m).   Weight as of this encounter: 120 lb (54.4 kg).  BMI interpretation table: BMI level Category Range association with higher incidence of chronic pain  <18 kg/m2 Underweight   18.5-24.9 kg/m2 Ideal body weight   25-29.9 kg/m2 Overweight Increased incidence by 20%  30-34.9 kg/m2 Obese (Class I) Increased incidence by 68%  35-39.9 kg/m2 Severe obesity (Class II) Increased incidence by 136%  >40 kg/m2 Extreme obesity (Class III) Increased incidence by 254%   BMI Readings from Last 4 Encounters:  04/03/17 24.24 kg/m  03/23/17 24.24 kg/m  03/20/17 24.24 kg/m  03/13/17 24.24 kg/m   Wt Readings from Last 4 Encounters:  04/03/17 120 lb (54.4 kg)  03/23/17 120 lb (54.4 kg)  03/20/17 120 lb (54.4 kg)  03/13/17 120 lb (54.4 kg)  Psych/Mental status: Alert, oriented x 3 (person, place, & time)       Eyes: PERLA Respiratory: No evidence of acute respiratory distress  Cervical Spine Area Exam  Skin & Axial Inspection: No masses, redness, edema, swelling, or associated skin lesions Alignment: Symmetrical Functional ROM: Decreased ROM       Stability: No instability detected Muscle Tone/Strength: Functionally intact. No obvious neuro-muscular anomalies detected. Sensory (Neurological): Articular pain pattern Palpation: Complains of area being tender to palpation Positive provocative maneuver  for for cervical facet disease  Upper Extremity (UE) Exam    Side: Right upper extremity  Side: Left upper extremity  Skin & Extremity Inspection: Skin color, temperature, and hair growth are WNL. No peripheral edema or cyanosis. No masses, redness, swelling, asymmetry, or associated skin lesions. No contractures.  Skin & Extremity Inspection: Skin color, temperature, and hair growth are WNL. No peripheral edema or cyanosis. No masses, redness, swelling, asymmetry, or associated skin lesions. No contractures.  Functional ROM: Unrestricted ROM          Functional ROM: Unrestricted ROM          Muscle Tone/Strength: Functionally intact. No obvious neuro-muscular anomalies detected.  Muscle Tone/Strength: Functionally intact. No obvious neuro-muscular anomalies detected.  Sensory (Neurological): Unimpaired          Sensory (Neurological): Unimpaired          Palpation: No palpable anomalies              Palpation: No palpable anomalies              Specialized Test(s): Deferred         Specialized Test(s): Deferred          Thoracic Spine Area Exam  Skin & Axial Inspection: No masses, redness, or swelling Alignment: Symmetrical Functional ROM: Unrestricted ROM Stability: No instability detected Muscle Tone/Strength: Functionally intact. No obvious neuro-muscular anomalies detected. Sensory (Neurological): Unimpaired Muscle strength & Tone: No palpable anomalies  Lumbar Spine Area Exam  Skin & Axial Inspection: No masses, redness, or swelling Alignment: Symmetrical Functional ROM: Unrestricted ROM      Stability: No instability detected Muscle Tone/Strength: Functionally intact. No obvious neuro-muscular anomalies detected. Sensory  (Neurological): Unimpaired Palpation: No palpable anomalies       Provocative Tests: Lumbar Hyperextension and rotation test: evaluation deferred today       Lumbar Lateral bending test: evaluation deferred today       Patrick's Maneuver: evaluation deferred today                    Gait & Posture Assessment  Ambulation: Unassisted Gait: Relatively normal for age and body habitus Posture: WNL   Lower Extremity Exam    Side: Right lower extremity  Side: Left lower extremity  Skin & Extremity Inspection: Skin color, temperature, and hair growth are WNL. No peripheral edema or cyanosis. No masses, redness, swelling, asymmetry, or associated skin lesions. No contractures.  Skin & Extremity Inspection: Skin color, temperature, and hair growth are WNL. No peripheral edema or cyanosis. No masses, redness, swelling, asymmetry, or associated skin lesions. No contractures.  Functional ROM: Unrestricted ROM          Functional ROM: Unrestricted ROM          Muscle Tone/Strength: Functionally intact. No obvious neuro-muscular anomalies detected.  Muscle Tone/Strength: Functionally intact. No obvious neuro-muscular anomalies detected.  Sensory (Neurological): Unimpaired  Sensory (Neurological): Unimpaired  Palpation: No palpable anomalies  Palpation: No palpable anomalies   Assessment  Primary Diagnosis & Pertinent Problem List: The primary encounter diagnosis was Spondylosis of cervical region without myelopathy or radiculopathy. Diagnoses of Cervicalgia, Neck pain, Elbow pain, chronic, left, Chronic pain syndrome, Bilateral carpal tunnel syndrome, and Chronic pain of right elbow were also pertinent to this visit.  Status Diagnosis  Controlled Controlled Controlled 1. Spondylosis of cervical region without myelopathy or radiculopathy   2. Cervicalgia   3. Neck pain   4. Elbow pain, chronic, left   5. Chronic  pain syndrome   6. Bilateral carpal tunnel syndrome   7. Chronic pain of right elbow      Problems updated and reviewed during this visit: Problem  Chronic Pain of Right Elbow  Spondylosis of Cervical Region Without Myelopathy Or Radiculopathy  Neck Pain   *Patient did not bring in pill bottle to count, states she will tomorrow. We will print and hold Rx until she brings in pill bottle for pill count to be done by nursing staff. **MRI reviewed, showed C4-C5 moderate foraminal stenosis R>L and moderate cervical degenerative disc disease. Also C3-4 congential fusion. Consider C7-T1 ESI in future for worsening sx.  Plan of Care  Pharmacotherapy (Medications Ordered): Meds ordered this encounter  Medications  . DISCONTD: oxyCODONE-acetaminophen (PERCOCET) 10-325 MG tablet    Sig: Take 1 tablet by mouth every 8 (eight) hours as needed for pain. For chronic pain To last for 30 days from fill date To be filled on or after: 04/12/17, 05/11/17    Dispense:  90 tablet    Refill:  0  . amitriptyline (ELAVIL) 25 MG tablet    Sig: Take 1 tablet (25 mg total) by mouth at bedtime for 14 days, THEN 2 tablets (50 mg total) at bedtime.    Dispense:  194 tablet    Refill:  0  . oxyCODONE-acetaminophen (PERCOCET) 10-325 MG tablet    Sig: Take 1 tablet by mouth every 8 (eight) hours as needed for pain. For chronic pain To last for 30 days from fill date To be filled on or after: 04/12/17, 05/11/17    Dispense:  90 tablet    Refill:  0    Provider-requested follow-up: Return in about 2 months (around 06/05/2017).  Future Appointments  Date Time Provider [place]  04/06/2017  8:15 AM Edrick Kins, DPM [facility]  05/29/2017  8:30 AM Gillis Santa, MD [facility] None    Primary Care Physician: Jearld Fenton, NP Location: [facility] Note by: Gillis Santa, M.D Date: 04/03/2017; Time: 8:37 AM  There are no Patient Instructions on file for this visit.

## 2017-04-03 NOTE — Progress Notes (Signed)
Nursing Pain Medication Assessment:  Safety precautions to be maintained throughout the outpatient stay will include: orient to surroundings, keep bed in low position, maintain call bell within reach at all times, provide assistance with transfer out of bed and ambulation.  Medication Inspection Compliance: Tammy Lang did not comply with our request to bring her pills to be counted. She was reminded that bringing the medication bottles, even when empty, is a requirement.  Medication: None brought in. Pill/Patch Count: None available to be counted. Bottle Appearance: No container available. Did not bring bottle(s) to appointment. Filled Date: N/A Last Medication intake:  Yesterday   Patient counseled on importance of bringing medicine with her in the bottle that it was filled in for count.

## 2017-04-04 NOTE — Progress Notes (Signed)
Nursing Pain Medication Assessment:  Safety precautions to be maintained throughout the outpatient stay will include: orient to surroundings, keep bed in low position, maintain call bell within reach at all times, provide assistance with transfer out of bed and ambulation.  Medication Inspection Compliance: Pill count conducted under aseptic conditions, in front of the patient. Neither the pills nor the bottle was removed from the patient's sight at any time. Once count was completed pills were immediately returned to the patient in their original bottle.  Medication: Oxycodone/APAP Pill/Patch Count: 16 of 90 pills remain Pill/Patch Appearance: Markings consistent with prescribed medication Bottle Appearance: Standard pharmacy container. Clearly labeled. Filled Date: 3 / 5 / 2019 Last Medication intake:  Today

## 2017-04-06 ENCOUNTER — Encounter: Payer: Self-pay | Admitting: Podiatry

## 2017-04-06 ENCOUNTER — Ambulatory Visit: Payer: 59 | Admitting: Podiatry

## 2017-04-06 DIAGNOSIS — L989 Disorder of the skin and subcutaneous tissue, unspecified: Secondary | ICD-10-CM | POA: Diagnosis not present

## 2017-04-06 DIAGNOSIS — M722 Plantar fascial fibromatosis: Secondary | ICD-10-CM

## 2017-04-11 NOTE — Progress Notes (Signed)
   Subjective: 52 year old female presenting today for follow up evaluation of plantar fasciitis of the right foot as well as a corn noted to the 2nd toe of the right foot. She states the plantar fasciitis has improved some but she is still experiencing a pulling sensation in the foot. She has been taking the Meloxicam which helps provided some relief. She states she took the Medrol Dose Pak but did not notice any significant relief from it. Patient presents today for further treatment and evaluation.   Past Medical History:  Diagnosis Date  . Allergy   . Arthritis   . Asthma   . Lipoma of chest wall 03/23/2017  . Spinal stenosis of cervical region      Objective: Physical Exam General: The patient is alert and oriented x3 in no acute distress.  Dermatology: Hyperkeratotic lesion present on the 2nd toe of the right foot. Pain on palpation with a central nucleated core noted. Hyperkeratotic, discolored, thickened, onychodystrophy of bilateral great toenails. Skin is warm, dry and supple bilateral lower extremities. Negative for open lesions or macerations bilateral.   Vascular: Dorsalis Pedis and Posterior Tibial pulses palpable bilateral.  Capillary fill time is immediate to all digits.  Neurological: Epicritic and protective threshold intact bilateral.   Musculoskeletal: Tenderness to palpation at the medial calcaneal tubercale and through the insertion of the plantar fascia of the right foot. All other joints range of motion within normal limits bilateral. Strength 5/5 in all groups bilateral.   Assessment: 1. Plantar fasciitis right 2. Corn 2nd toe right foot  Plan of Care:  1. Patient evaluated.  2. Injection of 0.5cc Celestone soluspan injected into the right plantar fascia  3. Continue taking Meloxicam, wearing fascial brace and night splint.  4. Excisional debridement of keratoic lesion using a chisel blade was performed without incident.  5. Dressed with light dressing. 6.  Continue using silicone toe caps.  7. Return to clinic in 4 weeks.   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     Edrick Kins, DPM [facility]  Dr. Edrick Kins, DPM    [address], [place] [address]                Office [phone]  Fax [phone]

## 2017-05-04 ENCOUNTER — Encounter: Payer: 59 | Admitting: Podiatry

## 2017-05-09 NOTE — Progress Notes (Signed)
This encounter was created in error - please disregard.

## 2017-05-29 ENCOUNTER — Other Ambulatory Visit: Payer: Self-pay

## 2017-05-29 ENCOUNTER — Ambulatory Visit
Payer: 59 | Attending: Student in an Organized Health Care Education/Training Program | Admitting: Student in an Organized Health Care Education/Training Program

## 2017-05-29 ENCOUNTER — Encounter: Payer: Self-pay | Admitting: Student in an Organized Health Care Education/Training Program

## 2017-05-29 VITALS — BP 136/102 | HR 98 | Temp 98.7°F | Resp 18 | Ht 59.0 in | Wt 125.0 lb

## 2017-05-29 DIAGNOSIS — Q7649 Other congenital malformations of spine, not associated with scoliosis: Secondary | ICD-10-CM | POA: Diagnosis not present

## 2017-05-29 DIAGNOSIS — M47812 Spondylosis without myelopathy or radiculopathy, cervical region: Secondary | ICD-10-CM

## 2017-05-29 DIAGNOSIS — M4802 Spinal stenosis, cervical region: Secondary | ICD-10-CM | POA: Insufficient documentation

## 2017-05-29 DIAGNOSIS — M25522 Pain in left elbow: Secondary | ICD-10-CM | POA: Diagnosis present

## 2017-05-29 DIAGNOSIS — G5603 Carpal tunnel syndrome, bilateral upper limbs: Secondary | ICD-10-CM | POA: Diagnosis not present

## 2017-05-29 DIAGNOSIS — G8929 Other chronic pain: Secondary | ICD-10-CM | POA: Diagnosis not present

## 2017-05-29 DIAGNOSIS — G894 Chronic pain syndrome: Secondary | ICD-10-CM

## 2017-05-29 DIAGNOSIS — M50321 Other cervical disc degeneration at C4-C5 level: Secondary | ICD-10-CM | POA: Insufficient documentation

## 2017-05-29 DIAGNOSIS — M79643 Pain in unspecified hand: Secondary | ICD-10-CM | POA: Diagnosis present

## 2017-05-29 DIAGNOSIS — M4722 Other spondylosis with radiculopathy, cervical region: Secondary | ICD-10-CM | POA: Diagnosis not present

## 2017-05-29 DIAGNOSIS — M7918 Myalgia, other site: Secondary | ICD-10-CM | POA: Diagnosis not present

## 2017-05-29 DIAGNOSIS — R51 Headache: Secondary | ICD-10-CM | POA: Diagnosis present

## 2017-05-29 DIAGNOSIS — M25521 Pain in right elbow: Secondary | ICD-10-CM | POA: Diagnosis not present

## 2017-05-29 DIAGNOSIS — M542 Cervicalgia: Secondary | ICD-10-CM | POA: Diagnosis not present

## 2017-05-29 DIAGNOSIS — M4720 Other spondylosis with radiculopathy, site unspecified: Secondary | ICD-10-CM | POA: Insufficient documentation

## 2017-05-29 MED ORDER — OXYCODONE-ACETAMINOPHEN 10-325 MG PO TABS: 1.0000 | ORAL_TABLET | Freq: Three times a day (TID) | ORAL | 0 refills | Status: DC | PRN

## 2017-05-29 MED ORDER — IBUPROFEN 800 MG PO TABS: 800.0000 mg | ORAL_TABLET | Freq: Two times a day (BID) | ORAL | 1 refills | Status: AC | PRN

## 2017-05-29 NOTE — Progress Notes (Signed)
Nursing Pain Medication Assessment:  Safety precautions to be maintained throughout the outpatient stay will include: orient to surroundings, keep bed in low position, maintain call bell within reach at all times, provide assistance with transfer out of bed and ambulation.  Medication Inspection Compliance: Pill count conducted under aseptic conditions, in front of the patient. Neither the pills nor the bottle was removed from the patient's sight at any time. Once count was completed pills were immediately returned to the patient in their original bottle.  Medication: Oxycodone/APAP Pill/Patch Count: 43 of 90 pills remain Pill/Patch Appearance: Markings consistent with prescribed medication Bottle Appearance: Standard pharmacy container. Clearly labeled. Filled Date: 05 / 05 / 2019 Last Medication intake:  Today

## 2017-05-29 NOTE — Patient Instructions (Signed)
You have been given 3 scripts for Percocet today.  You have been given pre procedure instructions. Preparing for your procedure (without sedation) Instructions: . Oral Intake: Do not eat or drink anything for at least 3 hours prior to your procedure. . Transportation: Unless otherwise stated by your physician, you may drive yourself after the procedure. . Blood Pressure Medicine: Take your blood pressure medicine with a sip of water the morning of the procedure. . Insulin: Take only  of your normal insulin dose. . Preventing infections: Shower with an antibacterial soap the morning of your procedure. . Build-up your immune system: Take 1000 mg of Vitamin C with every meal (3 times a day) the day prior to your procedure. . Pregnancy: If you are pregnant, call and cancel the procedure. . Sickness: If you have a cold, fever, or any active infections, call and cancel the procedure. . Arrival: You must be in the facility at least 30 minutes prior to your scheduled procedure. . Children: Do not bring any children with you. . Dress appropriately: Bring dark clothing that you would not mind if they get stained. . Valuables: Do not bring any jewelry or valuables. Procedure appointments are reserved for interventional treatments only. Marland Kitchen No Prescription Refills. . No medication changes will be discussed during procedure appointments. No disability issues will be discussed.Trigger Point Injections Patient Information  Description: Trigger points are areas of muscle sensitive to touch which cause pain with movement, sometimes felt some distance from the site of palpation.  Usually the muscle containing these trigger points if felt as a tight band or knot.   The area of maximum tenderness or trigger point is identified, and after antiseptic preparation of the skin, a small needle is placed into this site.  Reproduction of the pain often occurs and numbing medicine (local anesthetic) is injected into the  site, sometimes along with steroid preparation.  The entire block usually lasts less than 5 minutes.  Conditions which may be treated by trigger points:   Muscular pain and spasm  Nerve irritation  Preparation for the injection:  1. Do not eat any solid food or dairy products within 8 hours of your appointment. 2. You may drink clear liquids up to 3 hours before appointment.  Clear liquids include water, black coffee, juice or soda.  No milk or cream please. 3. You may take your regular medications, including pain medications, with a sip of water before your appointment.  Diabetics should hold regular insulin ( if take separately) and take 1/2 normal NPH dose the morning of the procedure.  Carry some sugar containing items with you to your appointment. 4. A driver must accompany you and be prepared to drive you home after your procedure.  5. Bring all your current medications with you. 6. An IV may be inserted and sedation may be given at the discretion of the physician.  7. A blood pressure cuff, EKG, and other monitors will often be applied during the procedure.  Some patients may need to have extra oxygen administered for a short period. 8. You will be asked to provide medical information, including your allergies and medications, prior to the procedure.  We must know immediately if you are taking blood thinners (like Coumadin/Warfarin) or if you are allergic to IV iodine contrast (dye).  We must know if you could possibly be pregnant.  Possible side-effects:   Bleeding from needle site  Infection (rare, may require surgery)  Nerve injury (rare)  Numbness & tingling (temporary)  Punctured lung (if injection around chest)  Light-headedness (temporary)  Pain at injection site (several days)  Decreased blood pressure (rare, temporary)  Weakness in arm/leg (temporary)  Call if you experience:   Hive or difficulty breathing (go to the emergency room)  Inflammation or  drainage at the injection site(s)  Please note:  Although the local anesthetic injected can often make your painful muscle feel good for several hours after the injection, the pain may return.  It takes 3-7 days for steroids to work.  You may not notice any pain relief for at least one week.  If effective, we will often do a series of injections spaced 3-6 weeks apart to maximally decrease your pain.  If you have any questions please call [phone] [place]  What are the risk, side effects and possible complications? Generally speaking, most procedures are safe.  However, with any procedure there are risks, side effects, and the possibility of complications.  The risks and complications are dependent upon the sites that are lesioned, or the type of nerve block to be performed.  The closer the procedure is to the spine, the more serious the risks are.  Great care is taken when placing the radio frequency needles, block needles or lesioning probes, but sometimes complications can occur. 1. Infection: Any time there is an injection through the skin, there is a risk of infection.  This is why sterile conditions are used for these blocks.  There are four possible types of infection. 1. Localized skin infection. 2. Central Nervous System Infection-This can be in the form of Meningitis, which can be deadly. 3. Epidural Infections-This can be in the form of an epidural abscess, which can cause pressure inside of the spine, causing compression of the spinal cord with subsequent paralysis. This would require an emergency surgery to decompress, and there are no guarantees that the patient would recover from the paralysis. 4. Discitis-This is an infection of the intervertebral discs.  It occurs in about 1% of discography procedures.  It is difficult to treat and it may lead to surgery.        2. Pain: the needles have to go through skin  and soft tissues, will cause soreness.       3. Damage to internal structures:  The nerves to be lesioned may be near blood vessels or    other nerves which can be potentially damaged.       4. Bleeding: Bleeding is more common if the patient is taking blood thinners such as  aspirin, Coumadin, Ticiid, Plavix, etc., or if he/she have some genetic predisposition  such as hemophilia. Bleeding into the spinal canal can cause compression of the spinal  cord with subsequent paralysis.  This would require an emergency surgery to  decompress and there are no guarantees that the patient would recover from the  paralysis.       5. Pneumothorax:  Puncturing of a lung is a possibility, every time a needle is introduced in  the area of the chest or upper back.  Pneumothorax refers to free air around the  collapsed lung(s), inside of the thoracic cavity (chest cavity).  Another two possible  complications related to a similar event would include: Hemothorax and Chylothorax.   These are variations of the Pneumothorax, where instead of air around the collapsed  lung(s), you may have blood or chyle, respectively.       6. Spinal headaches: They may  occur with any procedures in the area of the spine.       7. Persistent CSF (Cerebro-Spinal Fluid) leakage: This is a rare problem, but may occur  with prolonged intrathecal or epidural catheters either due to the formation of a fistulous  track or a dural tear.       8. Nerve damage: By working so close to the spinal cord, there is always a possibility of  nerve damage, which could be as serious as a permanent spinal cord injury with  paralysis.       9. Death:  Although rare, severe deadly allergic reactions known as "Anaphylactic  reaction" can occur to any of the medications used.      10. Worsening of the symptoms:  We can always make thing worse.  What are the chances of something like this happening? Chances of any of this occuring are extremely low.  By statistics,  you have more of a chance of getting killed in a motor vehicle accident: while driving to the hospital than any of the above occurring .  Nevertheless, you should be aware that they are possibilities.  In general, it is similar to taking a shower.  Everybody knows that you can slip, hit your head and get killed.  Does that mean that you should not shower again?  Nevertheless always keep in mind that statistics do not mean anything if you happen to be on the wrong side of them.  Even if a procedure has a 1 (one) in a 1,000,000 (million) chance of going wrong, it you happen to be that one..Also, keep in mind that by statistics, you have more of a chance of having something go wrong when taking medications.  Who should not have this procedure? If you are on a blood thinning medication (e.g. Coumadin, Plavix, see list of "Blood Thinners"), or if you have an active infection going on, you should not have the procedure.  If you are taking any blood thinners, please inform your physician.  How should I prepare for this procedure?  Do not eat or drink anything at least six hours prior to the procedure.  Bring a driver with you .  It cannot be a taxi.  Come accompanied by an adult that can drive you back, and that is strong enough to help you if your legs get weak or numb from the local anesthetic.  Take all of your medicines the morning of the procedure with just enough water to swallow them.  If you have diabetes, make sure that you are scheduled to have your procedure done first thing in the morning, whenever possible.  If you have diabetes, take only half of your insulin dose and notify our nurse that you have done so as soon as you arrive at the clinic.  If you are diabetic, but only take blood sugar pills (oral hypoglycemic), then do not take them on the morning of your procedure.  You may take them after you have had the procedure.  Do not take aspirin or any aspirin-containing medications, at  least eleven (11) days prior to the procedure.  They may prolong bleeding.  Wear loose fitting clothing that may be easy to take off and that you would not mind if it got stained with Betadine or blood.  Do not wear any jewelry or perfume  Remove any nail coloring.  It will interfere with some of our monitoring equipment.  NOTE: Remember that this is not meant to be interpreted  as a complete list of all possible complications.  Unforeseen problems may occur.  BLOOD THINNERS The following drugs contain aspirin or other products, which can cause increased bleeding during surgery and should not be taken for 2 weeks prior to and 1 week after surgery.  If you should need take something for relief of minor pain, you may take acetaminophen which is found in Tylenol,m Datril, Anacin-3 and Panadol. It is not blood thinner. The products listed below are.  Do not take any of the products listed below in addition to any listed on your instruction sheet.  A.P.C or A.P.C with Codeine Codeine Phosphate Capsules #3 Ibuprofen Ridaura  ABC compound Congesprin Imuran rimadil  Advil Cope Indocin Robaxisal  Alka-Seltzer Effervescent Pain Reliever and Antacid Coricidin or Coricidin-D  Indomethacin Rufen  Alka-Seltzer plus Cold Medicine Cosprin Ketoprofen S-A-C Tablets  Anacin Analgesic Tablets or Capsules Coumadin Korlgesic Salflex  Anacin Extra Strength Analgesic tablets or capsules CP-2 Tablets Lanoril Salicylate  Anaprox Cuprimine Capsules Levenox Salocol  Anexsia-D Dalteparin Magan Salsalate  Anodynos Darvon compound Magnesium Salicylate Sine-off  Ansaid Dasin Capsules Magsal Sodium Salicylate  Anturane Depen Capsules Marnal Soma  APF Arthritis pain formula Dewitt's Pills Measurin Stanback  Argesic Dia-Gesic Meclofenamic Sulfinpyrazone  Arthritis Bayer Timed Release Aspirin Diclofenac Meclomen Sulindac  Arthritis pain formula Anacin Dicumarol Medipren Supac  Analgesic (Safety coated) Arthralgen  Diffunasal Mefanamic Suprofen  Arthritis Strength Bufferin Dihydrocodeine Mepro Compound Suprol  Arthropan liquid Dopirydamole Methcarbomol with Aspirin Synalgos  ASA tablets/Enseals Disalcid Micrainin Tagament  Ascriptin Doan's Midol Talwin  Ascriptin A/D Dolene Mobidin Tanderil  Ascriptin Extra Strength Dolobid Moblgesic Ticlid  Ascriptin with Codeine Doloprin or Doloprin with Codeine Momentum Tolectin  Asperbuf Duoprin Mono-gesic Trendar  Aspergum Duradyne Motrin or Motrin IB Triminicin  Aspirin plain, buffered or enteric coated Durasal Myochrisine Trigesic  Aspirin Suppositories Easprin Nalfon Trillsate  Aspirin with Codeine Ecotrin Regular or Extra Strength Naprosyn Uracel  Atromid-S Efficin Naproxen Ursinus  Auranofin Capsules Elmiron Neocylate Vanquish  Axotal Emagrin Norgesic Verin  Azathioprine Empirin or Empirin with Codeine Normiflo Vitamin E  Azolid Emprazil Nuprin Voltaren  Bayer Aspirin plain, buffered or children's or timed BC Tablets or powders Encaprin Orgaran Warfarin Sodium  Buff-a-Comp Enoxaparin Orudis Zorpin  Buff-a-Comp with Codeine Equegesic Os-Cal-Gesic   Buffaprin Excedrin plain, buffered or Extra Strength Oxalid   Bufferin Arthritis Strength Feldene Oxphenbutazone   Bufferin plain or Extra Strength Feldene Capsules Oxycodone with Aspirin   Bufferin with Codeine Fenoprofen Fenoprofen Pabalate or Pabalate-SF   Buffets II Flogesic Panagesic   Buffinol plain or Extra Strength Florinal or Florinal with Codeine Panwarfarin   Buf-Tabs Flurbiprofen Penicillamine   Butalbital Compound Four-way cold tablets Penicillin   Butazolidin Fragmin Pepto-Bismol   Carbenicillin Geminisyn Percodan   Carna Arthritis Reliever Geopen Persantine   Carprofen Gold's salt Persistin   Chloramphenicol Goody's Phenylbutazone   Chloromycetin Haltrain Piroxlcam   Clmetidine heparin Plaquenil   Cllnoril Hyco-pap Ponstel   Clofibrate Hydroxy chloroquine Propoxyphen         Before  stopping any of these medications, be sure to consult the physician who ordered them.  Some, such as Coumadin (Warfarin) are ordered to prevent or treat serious conditions such as "deep thrombosis", "pumonary embolisms", and other heart problems.  The amount of time that you may need off of the medication may also vary with the medication and the reason for which you were taking it.  If you are taking any of these medications, please make sure you notify your pain physician  before you undergo any procedures.

## 2017-05-29 NOTE — Progress Notes (Signed)
Patient's Name: Tammy Lang  MRN: [id number]  Referring Provider: Jearld Fenton, NP  DOB: 1965/10/29  PCP: Jearld Fenton, NP  DOS: 05/29/2017  Note by: Gillis Santa, MD  Service setting: Ambulatory outpatient  Specialty: Interventional Pain Management  Location: [facility] (AMB) Pain Management Facility    Patient type: Established   Primary Reason(s) for Visit: Encounter for prescription drug management. (Level of risk: moderate)  CC: Elbow Pain (left); Hand Pain; and Headache  HPI  Tammy Lang is a 52 y.o. year old, female patient, who comes today for a medication management evaluation. She has Osteoarthritis of spine with radiculopathy, cervical region; Seasonal allergies; Spondylosis of cervical region without myelopathy or radiculopathy; Neck pain; Chronic pain syndrome; Bilateral carpal tunnel syndrome; Chronic pain of right elbow; Myofascial pain; and Lipoma of chest wall on their problem list. Her primarily concern today is the Elbow Pain (left); Hand Pain; and Headache  Pain Assessment: Location: Left Elbow Radiating: radiates into shoulders and arm Onset: More than a month ago Duration: Chronic pain Quality: Aching, Burning, Tingling Severity: 7 /10 (subjective, self-reported pain score)  Note: Reported level is inconsistent with clinical observations.                         When using our objective Pain Scale, levels between 6 and 10/10 are said to belong in an emergency room, as it progressively worsens from a 6/10, described as severely limiting, requiring emergency care not usually available at an outpatient pain management facility. At a 6/10 level, communication becomes difficult and requires great effort. Assistance to reach the emergency department may be required. Facial flushing and profuse sweating along with potentially dangerous increases in heart rate and blood pressure will be evident. Effect on ADL:   Timing: Constant Modifying factors: procedure BP: (!) 136/102   HR: 98  Tammy Lang was last scheduled for an appointment on 04/03/2017 for medication management. During today's appointment we reviewed [name]'s chronic pain status, as well as her outpatient medication regimen.  The patient  reports that she does not use drugs. Her body mass index is 25.25 kg/m.  Further details on both, my assessment(s), as well as the proposed treatment plan, please see below.  Controlled Substance Pharmacotherapy Assessment REMS (Risk Evaluation and Mitigation Strategy)  Analgesic: Percocet 10 mg 3 times daily as needed, quantity 90 MME/day: 45 mg/day.    Dewayne Shorter, RN  05/29/2017  8:58 AM  Sign at close encounter Nursing Pain Medication Assessment:  Safety precautions to be maintained throughout the outpatient stay will include: orient to surroundings, keep bed in low position, maintain call bell within reach at all times, provide assistance with transfer out of bed and ambulation.  Medication Inspection Compliance: Pill count conducted under aseptic conditions, in front of the patient. Neither the pills nor the bottle was removed from the patient's sight at any time. Once count was completed pills were immediately returned to the patient in their original bottle.  Medication: Oxycodone/APAP Pill/Patch Count: 43 of 90 pills remain Pill/Patch Appearance: Markings consistent with prescribed medication Bottle Appearance: Standard pharmacy container. Clearly labeled. Filled Date: 05 / 05 / 2019 Last Medication intake:  Today   Pharmacokinetics: Liberation and absorption (onset of action): WNL Distribution (time to peak effect): WNL Metabolism and excretion (duration of action): WNL         Pharmacodynamics: Desired effects: Analgesia: [name] reports >50% benefit. Functional ability: Patient reports that medication allows her to  accomplish basic ADLs Clinically meaningful improvement in function (CMIF): Sustained CMIF goals met Perceived  effectiveness: Described as relatively effective, allowing for increase in activities of daily living (ADL) Undesirable effects: Side-effects or Adverse reactions: None reported Monitoring: [place] PMP: Online review of the past 76-monthperiod conducted. Compliant with practice rules and regulations Last UDS on record: Summary  Date Value Ref Range Status  10/17/2016 FINAL  Final    Comment:    ==================================================================== [facility]RE COMP DRUG ANALYSIS,UR ==================================================================== Test                             Result       Flag       Units Drug Present and Declared for Prescription Verification   Oxycodone                      470          EXPECTED   ng/mg creat   Oxymorphone                    750          EXPECTED   ng/mg creat   Noroxycodone                   1621         EXPECTED   ng/mg creat   Noroxymorphone                 257          EXPECTED   ng/mg creat    Sources of oxycodone are scheduled prescription medications.    Oxymorphone, noroxycodone, and noroxymorphone are expected    metabolites of oxycodone. Oxymorphone is also available as a    scheduled prescription medication.   Baclofen                       PRESENT      EXPECTED   Acetaminophen                  PRESENT      EXPECTED   Diphenhydramine                PRESENT      EXPECTED Drug Present not Declared for Prescription Verification   Gabapentin                     PRESENT      UNEXPECTED   Trazodone                      PRESENT      UNEXPECTED   1,3 chlorophenyl piperazine    PRESENT      UNEXPECTED    1,3-chlorophenyl piperazine is an expected metabolite of    trazodone.   Naproxen                       PRESENT      UNEXPECTED Drug Absent but Declared for Prescription Verification   Duloxetine                     Not Detected UNEXPECTED   Diclofenac                     Not Detected UNEXPECTED    Diclofenac, as indicated in the  declared medication list,  is not    always detected even when used as directed.   Lidocaine                      Not Detected UNEXPECTED    Lidocaine, as indicated in the declared medication list, is not    always detected even when used as directed. ==================================================================== Test                      Result    Flag   Units      Ref Range   Creatinine              159              mg/dL      >=20 ==================================================================== Declared Medications:  The flagging and interpretation on this report are based on the  following declared medications.  Unexpected results may arise from  inaccuracies in the declared medications.  **Note: The testing scope of this panel includes these medications:  Baclofen (Lioresal)  Diphenhydramine (Benadryl)  Duloxetine (Cymbalta)  Oxycodone (Percocet)  **Note: The testing scope of this panel does not include small to  moderate amounts of these reported medications:  Acetaminophen (Percocet)  Diclofenac (Voltaren)  Lidocaine (Xylocaine)  **Note: The testing scope of this panel does not include following  reported medications:  Topical ==================================================================== For clinical consultation, please call [phone]. ====================================================================    UDS interpretation: Compliant          Medication Assessment Form: Reviewed. Patient indicates being compliant with therapy Treatment compliance: Compliant Risk Assessment Profile: Aberrant behavior: See prior evaluations. None observed or detected today Comorbid factors increasing risk of overdose: See prior notes. No additional risks detected today Risk of substance use disorder (SUD): Low Opioid Risk Tool - 05/29/17 0857      Family History of Substance Abuse   Alcohol  Negative    Illegal Drugs  Negative    Rx Drugs  Negative       Personal History of Substance Abuse   Alcohol  Negative    Illegal Drugs  Negative    Rx Drugs  Negative      Age   Age between 26-45 years   No      History of Preadolescent Sexual Abuse   History of Preadolescent Sexual Abuse  Negative or Female      Psychological Disease   Psychological Disease  Negative    Depression  Negative      Total Score   Opioid Risk Tool Scoring  0    Opioid Risk Interpretation  Low Risk      ORT Scoring interpretation table:  Score <3 = Low Risk for SUD  Score between 4-7 = Moderate Risk for SUD  Score >8 = High Risk for Opioid Abuse   Risk Mitigation Strategies:  Patient Counseling: Covered Patient-Prescriber Agreement (PPA): Present and active  Notification to other healthcare providers: Done  Pharmacologic Plan: No change in therapy, at this time.             Laboratory Chemistry  Inflammation Markers (CRP: Acute Phase) (ESR: Chronic Phase) No results found for: CRP, ESRSEDRATE, LATICACIDVEN                       Rheumatology Markers No results found for: RF, ANA, LABURIC, URICUR, LYMEIGGIGMAB, LYMEABIGMQN  Renal Function Markers No results found for: BUN, CREATININE, BCR, GFRAA, GFRNONAA                            Hepatic Function Markers No results found for: AST, ALT, ALBUMIN, ALKPHOS, HCVAB, AMYLASE, LIPASE, AMMONIA                      Electrolytes No results found for: NA, K, CL, CALCIUM, MG, PHOS                      Neuropathy Markers No results found for: VITAMINB12, FOLATE, HGBA1C, HIV                      Bone Pathology Markers No results found for: VD25OH, [id number], TD1761YW7, PX1062IR4, 25OHVITD1, 25OHVITD2, 25OHVITD3, TESTOFREE, TESTOSTERONE                       Coagulation Parameters Lab Results  Component Value Date   PLT 211 03/20/2017                        Cardiovascular Markers Lab Results  Component Value Date   HGB 13.5 03/20/2017   HCT 40.5 03/20/2017                          CA Markers No results found for: CEA, CA125, LABCA2                      Note: Lab results reviewed.  Recent Diagnostic Imaging Results  MR CERVICAL SPINE WO CONTRAST CLINICAL DATA:  Cervicalgia and radiculitis  EXAM: MRI CERVICAL SPINE WITHOUT CONTRAST  TECHNIQUE: Multiplanar, multisequence MR imaging of the cervical spine was performed. No intravenous contrast was administered.  COMPARISON:  None.  FINDINGS: Alignment: 3 mm retrolisthesis C4-5. Mild retrolisthesis C5-6 and C6-7. 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 Negative for fracture or mass. Normal bone marrow  Cord: Normal signal and morphology  Posterior Fossa, vertebral arteries, paraspinal tissues: Negative  Disc levels:  C2-3: Tiny central disc protrusion.  Negative for stenosis  C3-4: Congenital fusion.  Negative for stenosis  C4-5: 3 mm retrolisthesis. Moderate disc degeneration and diffuse uncinate spurring causing moderate foraminal stenosis right greater than left. Mild spinal stenosis.  C5-6: Mild retrolisthesis. Mild disc degeneration and spurring. Mild foraminal stenosis bilaterally  C6-7: Mild retrolisthesis. Disc degeneration and bilateral uncinate spurring. Mild foraminal narrowing bilaterally.  C7-T1: Mild anterolisthesis. Negative for stenosis. Bilateral facet degeneration.  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 Mild spinal stenosis and moderate foraminal stenosis right greater than left  Mild foraminal narrowing bilaterally C5-6 and C6-7 due to spurring.  Electronically Signed   By: Franchot Gallo M.D.   On: 03/23/2017 07:22  Complexity Note: Imaging results reviewed. Results shared with Ms. Golonka, using Layman's terms.                         Meds   Current Outpatient Medications:  .  albuterol (PROVENTIL HFA;VENTOLIN HFA) 108 (90 Base) MCG/ACT inhaler, Inhale 2 puffs into the lungs every 6 (six)  hours as needed for wheezing or shortness of breath., Disp: 1 Inhaler, Rfl: 1 .  clotrimazole-betamethasone (LOTRISONE) cream, Apply 1 application topically 2 (two) times daily. For  up to 2 weeks, Disp: 45 g, Rfl: 0 .  diphenhydrAMINE (BENADRYL) 25 MG tablet, Take 25 mg by mouth at bedtime. , Disp: , Rfl:  .  Multiple Vitamin (MULTIVITAMIN WITH MINERALS) TABS tablet, Take 1 tablet by mouth daily., Disp: , Rfl:  .  Multiple Vitamins-Minerals (HAIR SKIN AND NAILS FORMULA) TABS, Take 1 tablet by mouth daily., Disp: , Rfl:  .  oxyCODONE-acetaminophen (PERCOCET) 10-325 MG tablet, Take 1 tablet by mouth every 8 (eight) hours as needed for pain. For chronic pain To last for 30 days from fill date To be filled on or after: 06/12/17, 07/11/17, 08/10/17, Disp: 90 tablet, Rfl: 0 .  tiZANidine (ZANAFLEX) 4 MG tablet, Take 1 tablet (4 mg total) by mouth 3 (three) times daily., Disp: 90 tablet, Rfl: 1 .  ibuprofen (ADVIL,MOTRIN) 800 MG tablet, Take 1 tablet (800 mg total) by mouth 2 (two) times daily as needed for moderate pain. Take on full stomach., Disp: 60 tablet, Rfl: 1  ROS  Constitutional: Denies any fever or chills Gastrointestinal: No reported hemesis, hematochezia, vomiting, or acute GI distress Musculoskeletal: Denies any acute onset joint swelling, redness, loss of ROM, or weakness Neurological: No reported episodes of acute onset apraxia, aphasia, dysarthria, agnosia, amnesia, paralysis, loss of coordination, or loss of consciousness  Allergies  Ms. Hodgens is allergic to duloxetine.  PFSH  Drug: Ms. Mak  reports that she does not use drugs. Alcohol:  reports that she drinks alcohol. Tobacco:  reports that she has been smoking cigarettes.  She has a 15.00 pack-year smoking history. She has never used smokeless tobacco. Medical:  has a past medical history of Allergy, Arthritis, Asthma, Lipoma of chest wall (03/23/2017), and Spinal stenosis of cervical region. Surgical: Ms. Bassin  has a past  surgical history that includes Cesarean section; Bunionectomy; Ganglion cyst excision; Breast surgery (Right); Dilation and curettage of uterus; and Mass excision (Left, 03/23/2017). Family: family history includes Alcohol abuse in her mother; Cervical cancer in her mother; Hodgkin's lymphoma in her sister.  Constitutional Exam  General appearance: Well nourished, well developed, and well hydrated. In no apparent acute distress Vitals:   05/29/17 0852  BP: (!) 136/102  Pulse: 98  Resp: 18  Temp: 98.7 F (37.1 C)  SpO2: 99%  Weight: 125 lb (56.7 kg)  Height: 4' 11"  (1.499 m)   BMI Assessment: Estimated body mass index is 25.25 kg/m as calculated from the following:   Height as of this encounter: 4' 11"  (1.499 m).   Weight as of this encounter: 125 lb (56.7 kg).  BMI interpretation table: BMI level Category Range association with higher incidence of chronic pain  <18 kg/m2 Underweight   18.5-24.9 kg/m2 Ideal body weight   25-29.9 kg/m2 Overweight Increased incidence by 20%  30-34.9 kg/m2 Obese (Class I) Increased incidence by 68%  35-39.9 kg/m2 Severe obesity (Class II) Increased incidence by 136%  >40 kg/m2 Extreme obesity (Class III) Increased incidence by 254%   Patient's current BMI Ideal Body weight  Body mass index is 25.25 kg/m. Patient must be at least 60 in tall to calculate ideal body weight   BMI Readings from Last 4 Encounters:  05/29/17 25.25 kg/m  04/03/17 24.24 kg/m  03/23/17 24.24 kg/m  03/20/17 24.24 kg/m   Wt Readings from Last 4 Encounters:  05/29/17 125 lb (56.7 kg)  04/03/17 120 lb (54.4 kg)  03/23/17 120 lb (54.4 kg)  03/20/17 120 lb (54.4 kg)  Psych/Mental status: Alert, oriented x 3 (person, place, & time)  Eyes: PERLA Respiratory: No evidence of acute respiratory distress  Cervical Spine Area Exam  Skin & Axial Inspection: No masses, redness, edema, swelling, or associated skin lesions Alignment: Symmetrical Functional ROM: Decreased  ROM      Stability: No instability detected Muscle Tone/Strength: Functionally intact. No obvious neuro-muscular anomalies detected. Sensory (Neurological): Articular pain pattern Palpation: Complains of area being tender to palpation Positive provocative maneuver for for cervical facet disease    Upper Extremity (UE) Exam    Side: Right upper extremity  Side: Left upper extremity  Skin & Extremity Inspection: Skin color, temperature, and hair growth are WNL. No peripheral edema or cyanosis. No masses, redness, swelling, asymmetry, or associated skin lesions. No contractures.  Skin & Extremity Inspection: Skin color, temperature, and hair growth are WNL. No peripheral edema or cyanosis. No masses, redness, swelling, asymmetry, or associated skin lesions. No contractures.  Functional ROM: Unrestricted ROM          Functional ROM: Unrestricted ROM          Muscle Tone/Strength: Functionally intact. No obvious neuro-muscular anomalies detected.  Muscle Tone/Strength: Functionally intact. No obvious neuro-muscular anomalies detected.  Sensory (Neurological): Unimpaired          Sensory (Neurological): Unimpaired          Palpation: No palpable anomalies              Palpation: No palpable anomalies              Provocative Test(s):  Phalen's test: deferred Tinel's test: deferred Apley's scratch test (touch opposite shoulder):  Action 1 (Across chest): deferred Action 2 (Overhead): deferred Action 3 (LB reach): deferred   Provocative Test(s):  Phalen's test: deferred Tinel's test: deferred Apley's scratch test (touch opposite shoulder):  Action 1 (Across chest): deferred Action 2 (Overhead): deferred Action 3 (LB reach): deferred    Thoracic Spine Area Exam  Skin & Axial Inspection: No masses, redness, or swelling Alignment: Symmetrical Functional ROM: Unrestricted ROM Stability: No instability detected Muscle Tone/Strength: Functionally intact. No obvious neuro-muscular anomalies  detected. Sensory (Neurological): Unimpaired Muscle strength & Tone: No palpable anomalies  Lumbar Spine Area Exam  Skin & Axial Inspection: No masses, redness, or swelling Alignment: Symmetrical Functional ROM: Unrestricted ROM       Stability: No instability detected Muscle Tone/Strength: Functionally intact. No obvious neuro-muscular anomalies detected. Sensory (Neurological): Unimpaired Palpation: No palpable anomalies       Provocative Tests: Lumbar Hyperextension/rotation test: deferred today       Lumbar quadrant test (Kemp's test): deferred today       Lumbar Lateral bending test: deferred today       Patrick's Maneuver: deferred today                   FABER test: deferred today       Thigh-thrust test: deferred today       S-I compression test: deferred today       S-I distraction test: deferred today        Gait & Posture Assessment  Ambulation: Unassisted Gait: Relatively normal for age and body habitus Posture: WNL   Lower Extremity Exam    Side: Right lower extremity  Side: Left lower extremity  Stability: No instability observed          Stability: No instability observed          Skin & Extremity Inspection: Skin color, temperature, and hair growth are WNL. No peripheral edema or cyanosis. No  masses, redness, swelling, asymmetry, or associated skin lesions. No contractures.  Skin & Extremity Inspection: Skin color, temperature, and hair growth are WNL. No peripheral edema or cyanosis. No masses, redness, swelling, asymmetry, or associated skin lesions. No contractures.  Functional ROM: Unrestricted ROM                  Functional ROM: Unrestricted ROM                  Muscle Tone/Strength: Functionally intact. No obvious neuro-muscular anomalies detected.  Muscle Tone/Strength: Functionally intact. No obvious neuro-muscular anomalies detected.  Sensory (Neurological): Unimpaired  Sensory (Neurological): Unimpaired  Palpation: No palpable anomalies  Palpation: No  palpable anomalies   Assessment  Primary Diagnosis & Pertinent Problem List: The primary encounter diagnosis was Spondylosis of cervical region without myelopathy or radiculopathy. Diagnoses of Cervicalgia, Neck pain, Chronic pain syndrome, Bilateral carpal tunnel syndrome, Osteoarthritis of spine with radiculopathy, cervical region, Myofascial pain, Chronic pain of right elbow, and Chronic pain of left elbow were also pertinent to this visit.  Status Diagnosis  Controlled Controlled Controlled 1. Spondylosis of cervical region without myelopathy or radiculopathy   2. Cervicalgia   3. Neck pain   4. Chronic pain syndrome   5. Bilateral carpal tunnel syndrome   6. Osteoarthritis of spine with radiculopathy, cervical region   7. Myofascial pain   8. Chronic pain of right elbow   9. Chronic pain of left elbow      52 year old female with a history of cervical spondylosis, cervicalgia, chronic pain syndrome, chronic pain of bilateral elbow secondary to elbow osteoarthritis who presents today for medication refill and also endorsement of return of her bilateral elbow pain.  She states that her previous elbow joint injections performed on 02/28/2017 were beneficial for approximately 6 weeks.  She is interested in having these repeated.  We will also refill her medications as below.  Patient has discontinued amitriptyline due to side effects of weird dreams.  She is taking melatonin at night to help with sleep.  Patient is requesting ibuprofen 800 mg.  I discussed chronic NSAID therapy and recommended that she only take it on a full stomach and only as needed.  Patient's kidney function within normal limits.  We will also refill Percocet below for 3 months.  Plan: -Schedule for bilateral elbow joint injections -Okay to discontinue amitriptyline. -Continue melatonin at night for insomnia -Ibuprofen 800 mg twice daily as needed moderate pain.  Patient instructed to discontinue her Celebrex and to not  take any other NSAIDs while she is on ibuprofen. -Percocet prescription below for 3 months.   Plan of Care  Pharmacotherapy (Medications Ordered): Meds ordered this encounter  Medications  . ibuprofen (ADVIL,MOTRIN) 800 MG tablet    Sig: Take 1 tablet (800 mg total) by mouth 2 (two) times daily as needed for moderate pain. Take on full stomach.    Dispense:  60 tablet    Refill:  1  . DISCONTD: oxyCODONE-acetaminophen (PERCOCET) 10-325 MG tablet    Sig: Take 1 tablet by mouth every 8 (eight) hours as needed for pain. For chronic pain To last for 30 days from fill date To be filled on or after: 06/12/17, 07/11/17, 08/10/17    Dispense:  90 tablet    Refill:  0  . DISCONTD: oxyCODONE-acetaminophen (PERCOCET) 10-325 MG tablet    Sig: Take 1 tablet by mouth every 8 (eight) hours as needed for pain. For chronic pain To last for 30 days from  fill date To be filled on or after: 06/12/17, 07/11/17, 08/10/17    Dispense:  90 tablet    Refill:  0  . oxyCODONE-acetaminophen (PERCOCET) 10-325 MG tablet    Sig: Take 1 tablet by mouth every 8 (eight) hours as needed for pain. For chronic pain To last for 30 days from fill date To be filled on or after: 06/12/17, 07/11/17, 08/10/17    Dispense:  90 tablet    Refill:  0   Lab-work, procedure(s), and/or referral(s): Orders Placed This Encounter  Procedures  . Medium Joint Injection/Arthrocentesis   Time Note: Greater than 50% of the 25 minute(s) of face-to-face time spent with Ms. Fitz, was spent in counseling/coordination of care regarding: Ms. Mannings primary cause of pain, the treatment plan, treatment alternatives, the risks and possible complications of proposed treatment, medication side effects, going over the informed consent, the appropriate use of her medications, realistic expectations, the medication agreement and the patient's responsibilities when it comes to controlled substances. Provider-requested follow-up: Return in about 3 weeks (around  06/18/2017) for Procedure.  Future Appointments  Date Time Provider [place]  08/28/2017  8:30 AM Gillis Santa, MD [facility] None    Primary Care Physician: Jearld Fenton, NP Location: [facility] Outpatient Pain Management Facility Note by: Gillis Santa, M.D Date: 05/29/2017; Time: 9:16 AM  Patient Instructions   You have been given 3 scripts for Percocet today.  You have been given pre procedure instructions. Preparing for your procedure (without sedation) Instructions: . Oral Intake: Do not eat or drink anything for at least 3 hours prior to your procedure. . Transportation: Unless otherwise stated by your physician, you may drive yourself after the procedure. . Blood Pressure Medicine: Take your blood pressure medicine with a sip of water the morning of the procedure. . Insulin: Take only  of your normal insulin dose. . Preventing infections: Shower with an antibacterial soap the morning of your procedure. . Build-up your immune system: Take 1000 mg of Vitamin C with every meal (3 times a day) the day prior to your procedure. . Pregnancy: If you are pregnant, call and cancel the procedure. . Sickness: If you have a cold, fever, or any active infections, call and cancel the procedure. . Arrival: You must be in the facility at least 30 minutes prior to your scheduled procedure. . Children: Do not bring any children with you. . Dress appropriately: Bring dark clothing that you would not mind if they get stained. . Valuables: Do not bring any jewelry or valuables. Procedure appointments are reserved for interventional treatments only. Marland Kitchen No Prescription Refills. . No medication changes will be discussed during procedure appointments. No disability issues will be discussed.Trigger Point Injections Patient Information  Description: Trigger points are areas of muscle sensitive to touch which cause pain with movement, sometimes felt some distance from the site of palpation.  Usually  the muscle containing these trigger points if felt as a tight band or knot.   The area of maximum tenderness or trigger point is identified, and after antiseptic preparation of the skin, a small needle is placed into this site.  Reproduction of the pain often occurs and numbing medicine (local anesthetic) is injected into the site, sometimes along with steroid preparation.  The entire block usually lasts less than 5 minutes.  Conditions which may be treated by trigger points:   Muscular pain and spasm  Nerve irritation  Preparation for the injection:  1. Do not eat any solid food or dairy  products within 8 hours of your appointment. 2. You may drink clear liquids up to 3 hours before appointment.  Clear liquids include water, black coffee, juice or soda.  No milk or cream please. 3. You may take your regular medications, including pain medications, with a sip of water before your appointment.  Diabetics should hold regular insulin ( if take separately) and take 1/2 normal NPH dose the morning of the procedure.  Carry some sugar containing items with you to your appointment. 4. A driver must accompany you and be prepared to drive you home after your procedure.  5. Bring all your current medications with you. 6. An IV may be inserted and sedation may be given at the discretion of the physician.  7. A blood pressure cuff, EKG, and other monitors will often be applied during the procedure.  Some patients may need to have extra oxygen administered for a short period. 8. You will be asked to provide medical information, including your allergies and medications, prior to the procedure.  We must know immediately if you are taking blood thinners (like Coumadin/Warfarin) or if you are allergic to IV iodine contrast (dye).  We must know if you could possibly be pregnant.  Possible side-effects:   Bleeding from needle site  Infection (rare, may require surgery)  Nerve injury (rare)  Numbness &  tingling (temporary)  Punctured lung (if injection around chest)  Light-headedness (temporary)  Pain at injection site (several days)  Decreased blood pressure (rare, temporary)  Weakness in arm/leg (temporary)  Call if you experience:   Hive or difficulty breathing (go to the emergency room)  Inflammation or drainage at the injection site(s)  Please note:  Although the local anesthetic injected can often make your painful muscle feel good for several hours after the injection, the pain may return.  It takes 3-7 days for steroids to work.  You may not notice any pain relief for at least one week.  If effective, we will often do a series of injections spaced 3-6 weeks apart to maximally decrease your pain.  If you have any questions please call [phone] [place]  What are the risk, side effects and possible complications? Generally speaking, most procedures are safe.  However, with any procedure there are risks, side effects, and the possibility of complications.  The risks and complications are dependent upon the sites that are lesioned, or the type of nerve block to be performed.  The closer the procedure is to the spine, the more serious the risks are.  Great care is taken when placing the radio frequency needles, block needles or lesioning probes, but sometimes complications can occur. 1. Infection: Any time there is an injection through the skin, there is a risk of infection.  This is why sterile conditions are used for these blocks.  There are four possible types of infection. 1. Localized skin infection. 2. Central Nervous System Infection-This can be in the form of Meningitis, which can be deadly. 3. Epidural Infections-This can be in the form of an epidural abscess, which can cause pressure inside of the spine, causing compression of the spinal cord with subsequent paralysis. This would require an  emergency surgery to decompress, and there are no guarantees that the patient would recover from the paralysis. 4. Discitis-This is an infection of the intervertebral discs.  It occurs in about 1% of discography procedures.  It is difficult to treat and it may lead to surgery.  2. Pain: the needles have to go through skin and soft tissues, will cause soreness.       3. Damage to internal structures:  The nerves to be lesioned may be near blood vessels or    other nerves which can be potentially damaged.       4. Bleeding: Bleeding is more common if the patient is taking blood thinners such as  aspirin, Coumadin, Ticiid, Plavix, etc., or if he/she have some genetic predisposition  such as hemophilia. Bleeding into the spinal canal can cause compression of the spinal  cord with subsequent paralysis.  This would require an emergency surgery to  decompress and there are no guarantees that the patient would recover from the  paralysis.       5. Pneumothorax:  Puncturing of a lung is a possibility, every time a needle is introduced in  the area of the chest or upper back.  Pneumothorax refers to free air around the  collapsed lung(s), inside of the thoracic cavity (chest cavity).  Another two possible  complications related to a similar event would include: Hemothorax and Chylothorax.   These are variations of the Pneumothorax, where instead of air around the collapsed  lung(s), you may have blood or chyle, respectively.       6. Spinal headaches: They may occur with any procedures in the area of the spine.       7. Persistent CSF (Cerebro-Spinal Fluid) leakage: This is a rare problem, but may occur  with prolonged intrathecal or epidural catheters either due to the formation of a fistulous  track or a dural tear.       8. Nerve damage: By working so close to the spinal cord, there is always a possibility of  nerve damage, which could be as serious as a permanent spinal cord injury with  paralysis.        9. Death:  Although rare, severe deadly allergic reactions known as "Anaphylactic  reaction" can occur to any of the medications used.      10. Worsening of the symptoms:  We can always make thing worse.  What are the chances of something like this happening? Chances of any of this occuring are extremely low.  By statistics, you have more of a chance of getting killed in a motor vehicle accident: while driving to the hospital than any of the above occurring .  Nevertheless, you should be aware that they are possibilities.  In general, it is similar to taking a shower.  Everybody knows that you can slip, hit your head and get killed.  Does that mean that you should not shower again?  Nevertheless always keep in mind that statistics do not mean anything if you happen to be on the wrong side of them.  Even if a procedure has a 1 (one) in a 1,000,000 (million) chance of going wrong, it you happen to be that one..Also, keep in mind that by statistics, you have more of a chance of having something go wrong when taking medications.  Who should not have this procedure? If you are on a blood thinning medication (e.g. Coumadin, Plavix, see list of "Blood Thinners"), or if you have an active infection going on, you should not have the procedure.  If you are taking any blood thinners, please inform your physician.  How should I prepare for this procedure?  Do not eat or drink anything at least six hours prior to the procedure.  Bring a driver with  you .  It cannot be a taxi.  Come accompanied by an adult that can drive you back, and that is strong enough to help you if your legs get weak or numb from the local anesthetic.  Take all of your medicines the morning of the procedure with just enough water to swallow them.  If you have diabetes, make sure that you are scheduled to have your procedure done first thing in the morning, whenever possible.  If you have diabetes, take only half of your insulin dose and  notify our nurse that you have done so as soon as you arrive at the clinic.  If you are diabetic, but only take blood sugar pills (oral hypoglycemic), then do not take them on the morning of your procedure.  You may take them after you have had the procedure.  Do not take aspirin or any aspirin-containing medications, at least eleven (11) days prior to the procedure.  They may prolong bleeding.  Wear loose fitting clothing that may be easy to take off and that you would not mind if it got stained with Betadine or blood.  Do not wear any jewelry or perfume  Remove any nail coloring.  It will interfere with some of our monitoring equipment.  NOTE: Remember that this is not meant to be interpreted as a complete list of all possible complications.  Unforeseen problems may occur.  BLOOD THINNERS The following drugs contain aspirin or other products, which can cause increased bleeding during surgery and should not be taken for 2 weeks prior to and 1 week after surgery.  If you should need take something for relief of minor pain, you may take acetaminophen which is found in Tylenol,m Datril, Anacin-3 and Panadol. It is not blood thinner. The products listed below are.  Do not take any of the products listed below in addition to any listed on your instruction sheet.  A.P.C or A.P.C with Codeine Codeine Phosphate Capsules #3 Ibuprofen Ridaura  ABC compound Congesprin Imuran rimadil  Advil Cope Indocin Robaxisal  Alka-Seltzer Effervescent Pain Reliever and Antacid Coricidin or Coricidin-D  Indomethacin Rufen  Alka-Seltzer plus Cold Medicine Cosprin Ketoprofen S-A-C Tablets  Anacin Analgesic Tablets or Capsules Coumadin Korlgesic Salflex  Anacin Extra Strength Analgesic tablets or capsules CP-2 Tablets Lanoril Salicylate  Anaprox Cuprimine Capsules Levenox Salocol  Anexsia-D Dalteparin Magan Salsalate  Anodynos Darvon compound Magnesium Salicylate Sine-off  Ansaid Dasin Capsules Magsal Sodium  Salicylate  Anturane Depen Capsules Marnal Soma  APF Arthritis pain formula Dewitt's Pills Measurin Stanback  Argesic Dia-Gesic Meclofenamic Sulfinpyrazone  Arthritis Bayer Timed Release Aspirin Diclofenac Meclomen Sulindac  Arthritis pain formula Anacin Dicumarol Medipren Supac  Analgesic (Safety coated) Arthralgen Diffunasal Mefanamic Suprofen  Arthritis Strength Bufferin Dihydrocodeine Mepro Compound Suprol  Arthropan liquid Dopirydamole Methcarbomol with Aspirin Synalgos  ASA tablets/Enseals Disalcid Micrainin Tagament  Ascriptin Doan's Midol Talwin  Ascriptin A/D Dolene Mobidin Tanderil  Ascriptin Extra Strength Dolobid Moblgesic Ticlid  Ascriptin with Codeine Doloprin or Doloprin with Codeine Momentum Tolectin  Asperbuf Duoprin Mono-gesic Trendar  Aspergum Duradyne Motrin or Motrin IB Triminicin  Aspirin plain, buffered or enteric coated Durasal Myochrisine Trigesic  Aspirin Suppositories Easprin Nalfon Trillsate  Aspirin with Codeine Ecotrin Regular or Extra Strength Naprosyn Uracel  Atromid-S Efficin Naproxen Ursinus  Auranofin Capsules Elmiron Neocylate Vanquish  Axotal Emagrin Norgesic Verin  Azathioprine Empirin or Empirin with Codeine Normiflo Vitamin E  Azolid Emprazil Nuprin Voltaren  Bayer Aspirin plain, buffered or children's or timed BC Tablets or powders Encaprin  Orgaran Warfarin Sodium  Buff-a-Comp Enoxaparin Orudis Zorpin  Buff-a-Comp with Codeine Equegesic Os-Cal-Gesic   Buffaprin Excedrin plain, buffered or Extra Strength Oxalid   Bufferin Arthritis Strength Feldene Oxphenbutazone   Bufferin plain or Extra Strength Feldene Capsules Oxycodone with Aspirin   Bufferin with Codeine Fenoprofen Fenoprofen Pabalate or Pabalate-SF   Buffets II Flogesic Panagesic   Buffinol plain or Extra Strength Florinal or Florinal with Codeine Panwarfarin   Buf-Tabs Flurbiprofen Penicillamine   Butalbital Compound Four-way cold tablets Penicillin   Butazolidin Fragmin Pepto-Bismol    Carbenicillin Geminisyn Percodan   Carna Arthritis Reliever Geopen Persantine   Carprofen Gold's salt Persistin   Chloramphenicol Goody's Phenylbutazone   Chloromycetin Haltrain Piroxlcam   Clmetidine heparin Plaquenil   Cllnoril Hyco-pap Ponstel   Clofibrate Hydroxy chloroquine Propoxyphen         Before stopping any of these medications, be sure to consult the physician who ordered them.  Some, such as Coumadin (Warfarin) are ordered to prevent or treat serious conditions such as "deep thrombosis", "pumonary embolisms", and other heart problems.  The amount of time that you may need off of the medication may also vary with the medication and the reason for which you were taking it.  If you are taking any of these medications, please make sure you notify your pain physician before you undergo any procedures.

## 2017-06-18 ENCOUNTER — Ambulatory Visit
Payer: 59 | Attending: Student in an Organized Health Care Education/Training Program | Admitting: Student in an Organized Health Care Education/Training Program

## 2017-06-18 ENCOUNTER — Encounter: Payer: Self-pay | Admitting: Student in an Organized Health Care Education/Training Program

## 2017-06-18 VITALS — BP 140/101 | HR 95 | Temp 98.7°F | Resp 16 | Ht 59.0 in | Wt 125.0 lb

## 2017-06-18 DIAGNOSIS — M25521 Pain in right elbow: Secondary | ICD-10-CM | POA: Diagnosis not present

## 2017-06-18 DIAGNOSIS — M25522 Pain in left elbow: Secondary | ICD-10-CM | POA: Diagnosis not present

## 2017-06-18 DIAGNOSIS — G8929 Other chronic pain: Secondary | ICD-10-CM | POA: Insufficient documentation

## 2017-06-18 MED ORDER — DEXAMETHASONE SODIUM PHOSPHATE 10 MG/ML IJ SOLN
INTRAMUSCULAR | Status: AC
  Filled 2017-06-18: qty 1

## 2017-06-18 MED ORDER — ROPIVACAINE HCL 2 MG/ML IJ SOLN
INTRAMUSCULAR | Status: AC
  Filled 2017-06-18: qty 10

## 2017-06-18 MED ORDER — ROPIVACAINE HCL 2 MG/ML IJ SOLN
10.0000 mL | Freq: Once | INTRAMUSCULAR | Status: AC
  Administered 2017-06-18: 10 mL

## 2017-06-18 MED ORDER — LIDOCAINE HCL (PF) 1 % IJ SOLN
INTRAMUSCULAR | Status: AC
  Filled 2017-06-18: qty 10

## 2017-06-18 MED ORDER — LIDOCAINE HCL (PF) 1 % IJ SOLN: 10.0000 mL | Freq: Once | INTRAMUSCULAR | Status: DC

## 2017-06-18 MED ORDER — DEXAMETHASONE SODIUM PHOSPHATE 10 MG/ML IJ SOLN
10.0000 mg | Freq: Once | INTRAMUSCULAR | Status: AC
Start: 2017-06-18 — End: 2017-06-18
  Administered 2017-06-18: 10 mg

## 2017-06-18 NOTE — Progress Notes (Signed)
Patient's Name: Tammy Lang  MRN: [id number]  Referring Provider: Jearld Fenton, NP  DOB: [birth year]  PCP: Jearld Fenton, NP  DOS: 06/18/2017  Note by: Gillis Santa, MD  Service setting: Ambulatory outpatient  Specialty: Interventional Pain Management  Patient type: Established  Location: [facility] (AMB) Pain Management Facility  Visit type: Interventional Procedure   Primary Reason for Visit: Interventional Pain Management Treatment. CC: Elbow Pain (bilateral)  Procedure:  Anesthesia, Analgesia, Anxiolysis:  Type: Therapeutic Elbow Joint Steroid Injection Region: Lateral Elbow Area Level: Elbow Laterality: Bilateral  Type: none, no sedation or local Indication(s): Analgesia         Local Anesthetic: none Route: Infiltration (Bergen/IM) IV Access: Declined Sedation: Declined    Indications: 1. Chronic pain of right elbow   2. Elbow pain, chronic, left   3. Chronic pain of left elbow    Pain Score: Pre-procedure: 6 /10 Post-procedure: 0-No pain/10  Pre-op Assessment:  [name] is a 52 y.o. (year old), female patient, seen today for interventional treatment. She  has a past surgical history that includes Cesarean section; Bunionectomy; Ganglion cyst excision; Breast surgery (Right); Dilation and curettage of uterus; and Mass excision (Left, 03/23/2017). [name] has a current medication list which includes the following prescription(s): albuterol, clotrimazole-betamethasone, diphenhydramine, ibuprofen, multivitamin with minerals, hair skin and nails formula, oxycodone-acetaminophen, and tizanidine, and the following Facility-Administered Medications: lidocaine (pf). Her primarily concern today is the Elbow Pain (bilateral)  Initial Vital Signs:  Pulse/HCG Rate: 99  Temp: 98.7 F (37.1 C) Resp: 16 BP: (!) 128/106 SpO2: 99 %  BMI: Estimated body mass index is 25.25 kg/m as calculated from the following:   Height as of this encounter: 4\' 11"  (1.499 m).   Weight as of this  encounter: 125 lb (56.7 kg).  Risk Assessment: Allergies: Reviewed. She is allergic to duloxetine.  Allergy Precautions: None required Coagulopathies: Reviewed. None identified.  Blood-thinner therapy: None at this time Active Infection(s): Reviewed. None identified. [name] is afebrile  Site Confirmation: Ms. Whitelaw was asked to confirm the procedure and laterality before marking the site Procedure checklist: Completed Consent: Before the procedure and under the influence of no sedative(s), amnesic(s), or anxiolytics, the patient was informed of the treatment options, risks and possible complications. To fulfill our ethical and legal obligations, as recommended by the American Medical Association's Code of Ethics, I have informed the patient of my clinical impression; the nature and purpose of the treatment or procedure; the risks, benefits, and possible complications of the intervention; the alternatives, including doing nothing; the risk(s) and benefit(s) of the alternative treatment(s) or procedure(s); and the risk(s) and benefit(s) of doing nothing. The patient was provided information about the general risks and possible complications associated with the procedure. These may include, but are not limited to: failure to achieve desired goals, infection, bleeding, organ or nerve damage, allergic reactions, paralysis, and death. In addition, the patient was informed of those risks and complications associated to the procedure, such as failure to decrease pain; infection; bleeding; organ or nerve damage with subsequent damage to sensory, motor, and/or autonomic systems, resulting in permanent pain, numbness, and/or weakness of one or several areas of the body; allergic reactions; (i.e.: anaphylactic reaction); and/or death. Furthermore, the patient was informed of those risks and complications associated with the medications. These include, but are not limited to: allergic reactions (i.e.:  anaphylactic or anaphylactoid reaction(s)); adrenal axis suppression; blood sugar elevation that in diabetics may result in ketoacidosis or comma; water retention that in  patients with history of congestive heart failure may result in shortness of breath, pulmonary edema, and decompensation with resultant heart failure; weight gain; swelling or edema; medication-induced neural toxicity; particulate matter embolism and blood vessel occlusion with resultant organ, and/or nervous system infarction; and/or aseptic necrosis of one or more joints. Finally, the patient was informed that Medicine is not an exact science; therefore, there is also the possibility of unforeseen or unpredictable risks and/or possible complications that may result in a catastrophic outcome. The patient indicated having understood very clearly. We have given the patient no guarantees and we have made no promises. Enough time was given to the patient to ask questions, all of which were answered to the patient's satisfaction. Ms. Duffy has indicated that she wanted to continue with the procedure. Attestation: I, the ordering provider, attest that I have discussed with the patient the benefits, risks, side-effects, alternatives, likelihood of achieving goals, and potential problems during recovery for the procedure that I have provided informed consent. Date  Time:   Pre-Procedure Preparation:  Monitoring: As per clinic protocol. Respiration, ETCO2, SpO2, BP, heart rate and rhythm monitor placed and checked for adequate function Safety Precautions: Patient was assessed for positional comfort and pressure points before starting the procedure. Time-out: I initiated and conducted the "Time-out" before starting the procedure, as per protocol. The patient was asked to participate by confirming the accuracy of the "Time Out" information. Verification of the correct person, site, and procedure were performed and confirmed by me, the nursing  staff, and the patient. "Time-out" conducted as per Joint Commission's Universal Protocol (UP.01.01.01). Time: 1517  Description of Procedure Process:   Position: Sitting Target Area: Intra-articular elbow joint Approach: Lateral approach. Area Prepped: Entire elbow Region Prepping solution: ChloraPrep (2% chlorhexidine gluconate and 70% isopropyl alcohol) Safety Precautions: Aspiration looking for blood return was conducted prior to all injections. At no point did we inject any substances, as a needle was being advanced. No attempts were made at seeking any paresthesias. Safe injection practices and needle disposal techniques used. Medications properly checked for expiration dates. SDV (single dose vial) medications used. Latex Allergy precautions taken. Contrast Allergy precautions taken.   Description of the Procedure: Protocol guidelines were followed. The patient was placed in position. The target area was identified and the area prepped in the usual manner. The procedure needles were then advanced to the target area. Proper needle placement secured. Negative aspiration confirmed. Solution injected in intermittent fashion, asking for systemic symptoms every 0.5cc of injectate. The needles were then removed and the area cleansed, making sure to leave some of the prepping solution back to take advantage of its long term bactericidal properties. Vitals:   06/18/17 0825 06/18/17 0859  BP: (!) 128/106 (!) 140/101  Pulse: 99 95  Resp: 16 16  Temp: 98.7 F (37.1 C)   TempSrc: Oral   SpO2: 99% 99%  Weight: 125 lb (56.7 kg)   Height: 4\' 11"  (1.499 m)    Start Time: 0855 hrs. End Time: 0858 hrs. Materials:  Needle(s) Type: Regular needle Gauge: 22G Length: 3.5-in Medication(s): Please see orders for medications and dosing details. 3 cc solution made of 2 cc of 0.2% ropivacaine, 1 cc of Decadron 10 mg/cc.  1.5 cc injected in each elbow bilaterally Imaging Guidance:  Type of Imaging  Technique: None used Indication(s): N/A Exposure Time: No patient exposure Contrast: None used. Fluoroscopic Guidance: N/A Ultrasound Guidance: N/A Interpretation: N/A  Antibiotic Prophylaxis:   Anti-infectives (From admission, onward)   None  Indication(s): None identified  Post-operative Assessment:  Post-procedure Vital Signs:  Pulse/HCG Rate: 95  Temp: 98.7 F (37.1 C) Resp: 16 BP: (!) 140/101 SpO2: 99 %  EBL: None  Complications: No immediate post-treatment complications observed by team, or reported by patient.  Note: The patient tolerated the entire procedure well. A repeat set of vitals were taken after the procedure and the patient was kept under observation following institutional policy, for this type of procedure. Post-procedural neurological assessment was performed, showing return to baseline, prior to discharge. The patient was provided with post-procedure discharge instructions, including a section on how to identify potential problems. Should any problems arise concerning this procedure, the patient was given instructions to immediately contact us, at any time, without hesitation. In any case, we plan to contact the patient by telephone for a follow-up status report regarding this interventional procedure.  Comments:  No additional relevant information. 5 out of 5 strength bilateral upper extremity: Shoulder abduction, elbow flexion, elbow extension, thumb extension.  Plan of Care   Imaging Orders  No imaging studies ordered today   Procedure Orders    No procedure(s) ordered today    Medications ordered for procedure: Meds ordered this encounter  Medications  . ropivacaine (PF) 2 mg/mL (0.2%) (NAROPIN) injection 10 mL  . lidocaine (PF) (XYLOCAINE) 1 % injection 10 mL  . dexamethasone (DECADRON) injection 10 mg   Medications administered: We administered ropivacaine (PF) 2 mg/mL (0.2%) and dexamethasone.  See the medical record for exact dosing,  route, and time of administration.  New Prescriptions   No medications on file   Disposition: Discharge home  Discharge Date & Time: 06/18/2017; 0900 hrs.   Physician-requested Follow-up: No follow-ups on file.  Future Appointments  Date Time Provider [place]  08/28/2017  8:30 AM Gillis Santa, MD [facility] None   Primary Care Physician: Jearld Fenton, NP Location: [facility] Note by: Gillis Santa, MD Date: 06/18/2017; Time: 9:36 AM  Disclaimer:  Medicine is not an exact science. The only guarantee in medicine is that nothing is guaranteed. It is important to note that the decision to proceed with this intervention was based on the information collected from the patient. The Data and conclusions were drawn from the patient's questionnaire, the interview, and the physical examination. Because the information was provided in large part by the patient, it cannot be guaranteed that it has not been purposely or unconsciously manipulated. Every effort has been made to obtain as much relevant data as possible for this evaluation. It is important to note that the conclusions that lead to this procedure are derived in large part from the available data. Always take into account that the treatment will also be dependent on availability of resources and existing treatment guidelines, considered by other Pain Management Practitioners as being common knowledge and practice, at the time of the intervention. For Medico-Legal purposes, it is also important to point out that variation in procedural techniques and pharmacological choices are the acceptable norm. The indications, contraindications, technique, and results of the above procedure should only be interpreted and judged by a Board-Certified Interventional Pain Specialist with extensive familiarity and expertise in the same exact procedure and technique.

## 2017-06-19 ENCOUNTER — Telehealth: Payer: Self-pay

## 2017-06-19 NOTE — Telephone Encounter (Signed)
PoSt procedure phone call.  Patient states she is doing well.

## 2017-07-24 ENCOUNTER — Ambulatory Visit: Payer: 59 | Admitting: Podiatry

## 2017-07-30 ENCOUNTER — Telehealth: Payer: Self-pay | Admitting: *Deleted

## 2017-07-30 MED ORDER — LIDOCAINE 5 % EX PTCH: 1.0000 | MEDICATED_PATCH | CUTANEOUS | 2 refills | Status: DC

## 2017-07-30 NOTE — Telephone Encounter (Signed)
Called patient to get clarification on patches. States she needs a prescription for Lidocaine patches for her back.    Let me know your plans and I will call her back to inform.

## 2017-07-31 NOTE — Telephone Encounter (Signed)
Called patient to inform that her lidocaine patches were sent to the pharmacy.

## 2017-08-28 ENCOUNTER — Other Ambulatory Visit: Payer: Self-pay

## 2017-08-28 ENCOUNTER — Ambulatory Visit
Payer: 59 | Attending: Student in an Organized Health Care Education/Training Program | Admitting: Student in an Organized Health Care Education/Training Program

## 2017-08-28 ENCOUNTER — Encounter: Payer: Self-pay | Admitting: Student in an Organized Health Care Education/Training Program

## 2017-08-28 VITALS — BP 131/82 | HR 95 | Temp 98.4°F | Resp 18 | Ht 59.0 in | Wt 125.0 lb

## 2017-08-28 DIAGNOSIS — G5603 Carpal tunnel syndrome, bilateral upper limbs: Secondary | ICD-10-CM | POA: Diagnosis not present

## 2017-08-28 DIAGNOSIS — M25522 Pain in left elbow: Secondary | ICD-10-CM | POA: Diagnosis not present

## 2017-08-28 DIAGNOSIS — M4802 Spinal stenosis, cervical region: Secondary | ICD-10-CM | POA: Diagnosis not present

## 2017-08-28 DIAGNOSIS — J45909 Unspecified asthma, uncomplicated: Secondary | ICD-10-CM | POA: Insufficient documentation

## 2017-08-28 DIAGNOSIS — Z79899 Other long term (current) drug therapy: Secondary | ICD-10-CM | POA: Diagnosis not present

## 2017-08-28 DIAGNOSIS — G8929 Other chronic pain: Secondary | ICD-10-CM

## 2017-08-28 DIAGNOSIS — M25521 Pain in right elbow: Secondary | ICD-10-CM | POA: Diagnosis not present

## 2017-08-28 DIAGNOSIS — M19022 Primary osteoarthritis, left elbow: Secondary | ICD-10-CM | POA: Diagnosis not present

## 2017-08-28 DIAGNOSIS — Z8049 Family history of malignant neoplasm of other genital organs: Secondary | ICD-10-CM | POA: Insufficient documentation

## 2017-08-28 DIAGNOSIS — G894 Chronic pain syndrome: Secondary | ICD-10-CM | POA: Diagnosis not present

## 2017-08-28 DIAGNOSIS — M4722 Other spondylosis with radiculopathy, cervical region: Secondary | ICD-10-CM | POA: Diagnosis not present

## 2017-08-28 DIAGNOSIS — F17210 Nicotine dependence, cigarettes, uncomplicated: Secondary | ICD-10-CM | POA: Diagnosis not present

## 2017-08-28 DIAGNOSIS — Z79891 Long term (current) use of opiate analgesic: Secondary | ICD-10-CM | POA: Insufficient documentation

## 2017-08-28 DIAGNOSIS — M19021 Primary osteoarthritis, right elbow: Secondary | ICD-10-CM | POA: Insufficient documentation

## 2017-08-28 DIAGNOSIS — M7918 Myalgia, other site: Secondary | ICD-10-CM

## 2017-08-28 DIAGNOSIS — Z811 Family history of alcohol abuse and dependence: Secondary | ICD-10-CM | POA: Insufficient documentation

## 2017-08-28 MED ORDER — OXYCODONE-ACETAMINOPHEN 10-325 MG PO TABS: 1.0000 | ORAL_TABLET | Freq: Three times a day (TID) | ORAL | 0 refills | Status: DC | PRN

## 2017-08-28 MED ORDER — IBUPROFEN 800 MG PO TABS: ORAL_TABLET | ORAL | 3 refills | Status: DC

## 2017-08-28 MED ORDER — OXYCODONE-ACETAMINOPHEN 10-325 MG PO TABS: 1.0000 | ORAL_TABLET | Freq: Three times a day (TID) | ORAL | 0 refills | Status: AC | PRN

## 2017-08-28 NOTE — Patient Instructions (Addendum)
3 electronic prescription of Oxycodone to last until 11/30. Also, pick up prescription at pharmacy for Ibuprofen 800mg .

## 2017-08-28 NOTE — Progress Notes (Signed)
Patient's Name: Tammy Lang  MRN: [id number]  Referring Provider: Jearld Fenton, NP  DOB: Nov 29, 1965  PCP: Jearld Fenton, NP  DOS: 08/28/2017  Note by: Gillis Santa, MD  Service setting: Ambulatory outpatient  Specialty: Interventional Pain Management  Location: [facility] (AMB) Pain Management Facility    Patient type: Established   Primary Reason(s) for Visit: Encounter for prescription drug management & post-procedure evaluation of chronic illness with mild to moderate exacerbation(Level of risk: moderate) CC: Elbow Pain (bilateral)  HPI  Tammy Lang is a 52 y.o. year old, female patient, who comes today for a post-procedure evaluation and medication management. She has Osteoarthritis of spine with radiculopathy, cervical region; Seasonal allergies; Spondylosis of cervical region without myelopathy or radiculopathy; Neck pain; Chronic pain syndrome; Bilateral carpal tunnel syndrome; Chronic pain of right elbow; Myofascial pain; and Lipoma of chest wall on their problem list. Her primarily concern today is the Elbow Pain (bilateral)  Pain Assessment: Location: Right, Left Elbow Radiating: radiates to both hands Onset: More than a month ago Duration: Chronic pain Quality: Numbness, Sharp Severity: 7 /10 (subjective, self-reported pain score)  Note: Reported level is inconsistent with clinical observations.                         When using our objective Pain Scale, levels between 6 and 10/10 are said to belong in an emergency room, as it progressively worsens from a 6/10, described as severely limiting, requiring emergency care not usually available at an outpatient pain management facility. At a 6/10 level, communication becomes difficult and requires great effort. Assistance to reach the emergency department may be required. Facial flushing and profuse sweating along with potentially dangerous increases in heart rate and blood pressure will be evident. Effect on ADL: "It affects my  job" Timing: Constant Modifying factors: injections help BP: 131/82  HR: 95  Tammy Lang was last seen on 07/30/2017 for a procedure. During today's appointment we reviewed Tammy Lang's post-procedure results, as well as her outpatient medication regimen.  Endorsing return of bilateral elbow pain, right greater than left.  Is requesting repeat elbow injection.  Is also here for medication refill.  Further details on both, my assessment(s), as well as the proposed treatment plan, please see below.  Controlled Substance Pharmacotherapy Assessment REMS (Risk Evaluation and Mitigation Strategy)  Analgesic:Percocet 10 mg 3 times daily as needed, quantity 90 MME/day:84m/day.  TDewayne Shorter RN  08/28/2017  8:25 AM  Signed Nursing Pain Medication Assessment:  Safety precautions to be maintained throughout the outpatient stay will include: orient to surroundings, keep bed in low position, maintain call bell within reach at all times, provide assistance with transfer out of bed and ambulation.  Medication Inspection Compliance: Pill count conducted under aseptic conditions, in front of the patient. Neither the pills nor the bottle was removed from the patient's sight at any time. Once count was completed pills were immediately returned to the patient in their original bottle.  Medication: Oxycodone/APAP Pill/Patch Count: 30 of 90 pills remain Pill/Patch Appearance: Markings consistent with prescribed medication Bottle Appearance: Standard pharmacy container. Clearly labeled. Filled Date: 08 / 02 / 2019 Last Medication intake:  Yesterday   Pharmacokinetics: Liberation and absorption (onset of action): WNL Distribution (time to peak effect): WNL Metabolism and excretion (duration of action): WNL         Pharmacodynamics: Desired effects: Analgesia: Ms. MSchmelzerreports >50% benefit. Functional ability: Patient reports that medication allows her to accomplish basic  ADLs Clinically  meaningful improvement in function (CMIF): Sustained CMIF goals met Perceived effectiveness: Described as relatively effective, allowing for increase in activities of daily living (ADL) Undesirable effects: Side-effects or Adverse reactions: None reported Monitoring:  PMP: Online review of the past 18-monthperiod conducted. Compliant with practice rules and regulations Last UDS on record: Summary  Date Value Ref Range Status  10/17/2016 FINAL  Final    Comment:    ==================================================================== [facility] COMP DRUG ANALYSIS,UR ==================================================================== Test                             Result       Flag       Units Drug Present and Declared for Prescription Verification   Oxycodone                      470          EXPECTED   ng/mg creat   Oxymorphone                    750          EXPECTED   ng/mg creat   Noroxycodone                   1621         EXPECTED   ng/mg creat   Noroxymorphone                 257          EXPECTED   ng/mg creat    Sources of oxycodone are scheduled prescription medications.    Oxymorphone, noroxycodone, and noroxymorphone are expected    metabolites of oxycodone. Oxymorphone is also available as a    scheduled prescription medication.   Baclofen                       PRESENT      EXPECTED   Acetaminophen                  PRESENT      EXPECTED   Diphenhydramine                PRESENT      EXPECTED Drug Present not Declared for Prescription Verification   Gabapentin                     PRESENT      UNEXPECTED   Trazodone                      PRESENT      UNEXPECTED   1,3 chlorophenyl piperazine    PRESENT      UNEXPECTED    1,3-chlorophenyl piperazine is an expected metabolite of    trazodone.   Naproxen                       PRESENT      UNEXPECTED Drug Absent but Declared for Prescription Verification   Duloxetine                     Not Detected UNEXPECTED   Diclofenac                      Not Detected UNEXPECTED    Diclofenac, as indicated in the declared medication list, is not  always detected even when used as directed.   Lidocaine                      Not Detected UNEXPECTED    Lidocaine, as indicated in the declared medication list, is not    always detected even when used as directed. ==================================================================== Test                      Result    Flag   Units      Ref Range   Creatinine              159              mg/dL      >=20 ==================================================================== Declared Medications:  The flagging and interpretation on this report are based on the  following declared medications.  Unexpected results may arise from  inaccuracies in the declared medications.  **Note: The testing scope of this panel includes these medications:  Baclofen (Lioresal)  Diphenhydramine (Benadryl)  Duloxetine (Cymbalta)  Oxycodone (Percocet)  **Note: The testing scope of this panel does not include small to  moderate amounts of these reported medications:  Acetaminophen (Percocet)  Diclofenac (Voltaren)  Lidocaine (Xylocaine)  **Note: The testing scope of this panel does not include following  reported medications:  Topical ==================================================================== For clinical consultation, please call [phone]. ====================================================================    UDS interpretation: Compliant          Medication Assessment Form: Reviewed. Patient indicates being compliant with therapy Treatment compliance: Compliant Risk Assessment Profile: Aberrant behavior: See prior evaluations. None observed or detected today Comorbid factors increasing risk of overdose: See prior notes. No additional risks detected today Opioid risk tool (ORT) (Total Score): 0 Personal History of Substance Abuse (SUD-Substance use disorder):  Alcohol:  Negative  Illegal Drugs: Negative  Rx Drugs: Negative  ORT Risk Level calculation: Low Risk Risk of substance use disorder (SUD): Low Opioid Risk Tool - 08/28/17 1950      Family History of Substance Abuse   Alcohol  Negative    Illegal Drugs  Negative    Rx Drugs  Negative      Personal History of Substance Abuse   Alcohol  Negative    Illegal Drugs  Negative    Rx Drugs  Negative      Age   Age between 34-45 years   No      Psychological Disease   Psychological Disease  Negative    Depression  Negative      Total Score   Opioid Risk Tool Scoring  0    Opioid Risk Interpretation  Low Risk      ORT Scoring interpretation table:  Score <3 = Low Risk for SUD  Score between 4-7 = Moderate Risk for SUD  Score >8 = High Risk for Opioid Abuse   Risk Mitigation Strategies:  Patient Counseling: Covered Patient-Prescriber Agreement (PPA): Present and active  Notification to other healthcare providers: Done  Pharmacologic Plan: No change in therapy, at this time.             Post-Procedure Assessment  06/18/2017 Procedure: Bilateral elbow joint injection Pre-procedure pain score:        /10 Post-procedure pain score: 0/10         Influential Factors: BMI: 25.25 kg/m Intra-procedural challenges: None observed.         Assessment challenges: None detected.  Reported side-effects: None.        Post-procedural adverse reactions or complications: None reported         Sedation: Please see nurses note. When no sedatives are used, the analgesic levels obtained are directly associated to the effectiveness of the local anesthetics. However, when sedation is provided, the level of analgesia obtained during the initial 1 hour following the intervention, is believed to be the result of a combination of factors. These factors may include, but are not limited to: 1. The effectiveness of the local anesthetics used. 2. The effects of the analgesic(s) and/or anxiolytic(s)  used. 3. The degree of discomfort experienced by the patient at the time of the procedure. 4. The patients ability and reliability in recalling and recording the events. 5. The presence and influence of possible secondary gains and/or psychosocial factors. Reported result: Relief experienced during the 1st hour after the procedure: 100 % (Ultra-Short Term Relief)            Interpretative annotation: Clinically appropriate result. Analgesia during this period is likely to be Local Anesthetic and/or IV Sedative (Analgesic/Anxiolytic) related.          Effects of local anesthetic: The analgesic effects attained during this period are directly associated to the localized infiltration of local anesthetics and therefore cary significant diagnostic value as to the etiological location, or anatomical origin, of the pain. Expected duration of relief is directly dependent on the pharmacodynamics of the local anesthetic used. Long-acting (4-6 hours) anesthetics used.  Reported result: Relief during the next 4 to 6 hour after the procedure: 100 % (Short-Term Relief)            Interpretative annotation: Clinically appropriate result. Analgesia during this period is likely to be Local Anesthetic-related.          Long-term benefit: Defined as the period of time past the expected duration of local anesthetics (1 hour for short-acting and 4-6 hours for long-acting). With the possible exception of prolonged sympathetic blockade from the local anesthetics, benefits during this period are typically attributed to, or associated with, other factors such as analgesic sensory neuropraxia, antiinflammatory effects, or beneficial biochemical changes provided by agents other than the local anesthetics.  Reported result: Extended relief following procedure: 50 %(lasting 2 months) (Long-Term Relief)            Interpretative annotation: Clinically possible results. Good relief. No permanent benefit expected. Inflammation plays a  part in the etiology to the pain.          Current benefits: Defined as reported results that persistent at this point in time.   Analgesia: 25-50 %            Function: Somewhat improved ROM: Somewhat improved Interpretative annotation: Recurrence of symptoms. No permanent benefit expected. Effective diagnostic intervention.          Interpretation: Results would suggest a successful diagnostic intervention.                  Plan:  Repeat treatment or therapy and compare extent and duration of benefits.                Laboratory Chemistry  Inflammation Markers (CRP: Acute Phase) (ESR: Chronic Phase) No results found for: CRP, ESRSEDRATE, LATICACIDVEN                       Rheumatology Markers No results found for: RF, ANA, LABURIC, [place], [place], LYMEABIGMQN, HLAB27  Renal Function Markers No results found for: BUN, CREATININE, BCR, GFRAA, GFRNONAA                           Hepatic Function Markers No results found for: AST, ALT, ALBUMIN, ALKPHOS, HCVAB, AMYLASE, LIPASE, AMMONIA                      Electrolytes No results found for: NA, K, CL, CALCIUM, MG, PHOS                      Neuropathy Markers No results found for: VITAMINB12, FOLATE, HGBA1C, HIV                      Bone Pathology Markers No results found for: VD25OH, [id number], NK5397QB3, AL9379KW4, 25OHVITD1, 25OHVITD2, 25OHVITD3, TESTOFREE, TESTOSTERONE                       Coagulation Parameters Lab Results  Component Value Date   PLT 211 03/20/2017                        Cardiovascular Markers Lab Results  Component Value Date   HGB 13.5 03/20/2017   HCT 40.5 03/20/2017                         CA Markers No results found for: CEA, CA125, LABCA2                      Note: Lab results reviewed.  Recent Diagnostic Imaging Results  MR CERVICAL SPINE WO CONTRAST CLINICAL DATA:  Cervicalgia and radiculitis  EXAM: MRI CERVICAL SPINE WITHOUT  CONTRAST  TECHNIQUE: Multiplanar, multisequence MR imaging of the cervical spine was performed. No intravenous contrast was administered.  COMPARISON:  None.  FINDINGS: Alignment: 3 mm retrolisthesis C4-5. Mild retrolisthesis C5-6 and C6-7. 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 Negative for fracture or mass. Normal bone marrow  Cord: Normal signal and morphology  Posterior Fossa, vertebral arteries, paraspinal tissues: Negative  Disc levels:  C2-3: Tiny central disc protrusion.  Negative for stenosis  C3-4: Congenital fusion.  Negative for stenosis  C4-5: 3 mm retrolisthesis. Moderate disc degeneration and diffuse uncinate spurring causing moderate foraminal stenosis right greater than left. Mild spinal stenosis.  C5-6: Mild retrolisthesis. Mild disc degeneration and spurring. Mild foraminal stenosis bilaterally  C6-7: Mild retrolisthesis. Disc degeneration and bilateral uncinate spurring. Mild foraminal narrowing bilaterally.  C7-T1: Mild anterolisthesis. Negative for stenosis. Bilateral facet degeneration.  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 Mild spinal stenosis and moderate foraminal stenosis right greater than left  Mild foraminal narrowing bilaterally C5-6 and C6-7 due to spurring.  Electronically Signed   By: Franchot Gallo M.D.   On: 03/23/2017 07:22  Complexity Note: Imaging results reviewed. Results shared with Ms. Friend, using Layman's terms.                         Meds   Current Outpatient Medications:  .  albuterol (PROVENTIL HFA;VENTOLIN HFA) 108 (90 Base) MCG/ACT inhaler, Inhale 2 puffs into the lungs every 6 (six) hours as needed for wheezing or shortness of breath., Disp: 1 Inhaler, Rfl: 1 .  clotrimazole-betamethasone (LOTRISONE) cream, Apply 1 application topically 2 (two) times daily. For up to  2 weeks, Disp: 45 g, Rfl: 0 .  diphenhydrAMINE (BENADRYL) 25 MG tablet,  Take 25 mg by mouth at bedtime. , Disp: , Rfl:  .  ibuprofen (ADVIL,MOTRIN) 800 MG tablet, TAKE 1 TABLET BY MOUTH TWICE A DAY WITH FOOD AS NEEDED FOR MODERATE PAIN, Disp: 60 tablet, Rfl: 3 .  lidocaine (LIDODERM) 5 %, Place 1 patch onto the skin daily. Remove & Discard patch within 12 hours or as directed by MD, Disp: 30 patch, Rfl: 2 .  Multiple Vitamin (MULTIVITAMIN WITH MINERALS) TABS tablet, Take 1 tablet by mouth daily., Disp: , Rfl:  .  [START ON 09/09/2017] oxyCODONE-acetaminophen (PERCOCET) 10-325 MG tablet, Take 1 tablet by mouth every 8 (eight) hours as needed for pain., Disp: 90 tablet, Rfl: 0 .  tiZANidine (ZANAFLEX) 4 MG tablet, Take 1 tablet (4 mg total) by mouth 3 (three) times daily., Disp: 90 tablet, Rfl: 1 .  Multiple Vitamins-Minerals (HAIR SKIN AND NAILS FORMULA) TABS, Take 1 tablet by mouth daily., Disp: , Rfl:  .  [START ON 10/09/2017] oxyCODONE-acetaminophen (PERCOCET) 10-325 MG tablet, Take 1 tablet by mouth every 8 (eight) hours as needed for pain., Disp: 90 tablet, Rfl: 0 .  [START ON 11/08/2017] oxyCODONE-acetaminophen (PERCOCET) 10-325 MG tablet, Take 1 tablet by mouth every 8 (eight) hours as needed for pain., Disp: 90 tablet, Rfl: 0  ROS  Constitutional: Denies any fever or chills Gastrointestinal: No reported hemesis, hematochezia, vomiting, or acute GI distress Musculoskeletal: Denies any acute onset joint swelling, redness, loss of ROM, or weakness Neurological: No reported episodes of acute onset apraxia, aphasia, dysarthria, agnosia, amnesia, paralysis, loss of coordination, or loss of consciousness  Allergies  Ms. Jarquin is allergic to duloxetine.  PFSH  Drug: Ms. Broadfoot  reports that she does not use drugs. Alcohol:  reports that she drinks alcohol. Tobacco:  reports that she has been smoking cigarettes. She has a 15.00 pack-year smoking history. She has never used smokeless tobacco. Medical:  has a past medical history of Allergy, Arthritis, Asthma,  Lipoma of chest wall (03/23/2017), and Spinal stenosis of cervical region. Surgical: Ms. Ruffino  has a past surgical history that includes Cesarean section; Bunionectomy; Ganglion cyst excision; Breast surgery (Right); Dilation and curettage of uterus; and Mass excision (Left, 03/23/2017). Family: family history includes Alcohol abuse in her mother; Cervical cancer in her mother; Hodgkin's lymphoma in her sister.  Constitutional Exam  General appearance: Well nourished, well developed, and well hydrated. In no apparent acute distress Vitals:   08/28/17 0815 08/28/17 0816  BP:  131/82  Pulse:  95  Resp:  18  Temp:  98.4 F (36.9 C)  SpO2:  99%  Weight: 125 lb (56.7 kg)   Height: 4' 11"  (1.499 m)    BMI Assessment: Estimated body mass index is 25.25 kg/m as calculated from the following:   Height as of this encounter: 4' 11"  (1.499 m).   Weight as of this encounter: 125 lb (56.7 kg).  BMI interpretation table: BMI level Category Range association with higher incidence of chronic pain  <18 kg/m2 Underweight   18.5-24.9 kg/m2 Ideal body weight   25-29.9 kg/m2 Overweight Increased incidence by 20%  30-34.9 kg/m2 Obese (Class I) Increased incidence by 68%  35-39.9 kg/m2 Severe obesity (Class II) Increased incidence by 136%  >40 kg/m2 Extreme obesity (Class III) Increased incidence by 254%   Patient's current BMI Ideal Body weight  Body mass index is 25.25 kg/m. Patient must be at least 60 in tall to calculate ideal  body weight   BMI Readings from Last 4 Encounters:  08/28/17 25.25 kg/m  06/18/17 25.25 kg/m  05/29/17 25.25 kg/m  04/03/17 24.24 kg/m   Wt Readings from Last 4 Encounters:  08/28/17 125 lb (56.7 kg)  06/18/17 125 lb (56.7 kg)  05/29/17 125 lb (56.7 kg)  04/03/17 120 lb (54.4 kg)  Psych/Mental status: Alert, oriented x 3 (person, place, & time)       Eyes: PERLA Respiratory: No evidence of acute respiratory distress  Cervical Spine Area Exam  Skin & Axial  Inspection: No masses, redness, edema, swelling, or associated skin lesions Alignment: Symmetrical Functional ROM: Decreased ROM      Stability: No instability detected Muscle Tone/Strength: Functionally intact. No obvious neuro-muscular anomalies detected. Sensory (Neurological): Articular pain pattern Palpation: Complains of area being tender to palpation Positive provocative maneuver for for bilateral occipital neuralgia  Upper Extremity (UE) Exam    Side: Right upper extremity  Side: Left upper extremity  Skin & Extremity Inspection: Skin color, temperature, and hair growth are WNL. No peripheral edema or cyanosis. No masses, redness, swelling, asymmetry, or associated skin lesions. No contractures.  Skin & Extremity Inspection: Skin color, temperature, and hair growth are WNL. No peripheral edema or cyanosis. No masses, redness, swelling, asymmetry, or associated skin lesions. No contractures.  Functional ROM: Unrestricted ROM          Functional ROM: Unrestricted ROM          Muscle Tone/Strength: Functionally intact. No obvious neuro-muscular anomalies detected.  Muscle Tone/Strength: Functionally intact. No obvious neuro-muscular anomalies detected.  Sensory (Neurological): Unimpaired          Sensory (Neurological): Unimpaired          Palpation: No palpable anomalies              Palpation: No palpable anomalies              Provocative Test(s):  Phalen's test: deferred Tinel's test: deferred Apley's scratch test (touch opposite shoulder):  Action 1 (Across chest): deferred Action 2 (Overhead): deferred Action 3 (LB reach): deferred   Provocative Test(s):  Phalen's test: deferred Tinel's test: deferred Apley's scratch test (touch opposite shoulder):  Action 1 (Across chest): deferred Action 2 (Overhead): deferred Action 3 (LB reach): deferred    Thoracic Spine Area Exam  Skin & Axial Inspection: No masses, redness, or swelling Alignment: Symmetrical Functional ROM:  Unrestricted ROM Stability: No instability detected Muscle Tone/Strength: Functionally intact. No obvious neuro-muscular anomalies detected. Sensory (Neurological): Unimpaired Muscle strength & Tone: No palpable anomalies  Lumbar Spine Area Exam  Skin & Axial Inspection: No masses, redness, or swelling Alignment: Symmetrical Functional ROM: Unrestricted ROM       Stability: No instability detected Muscle Tone/Strength: Functionally intact. No obvious neuro-muscular anomalies detected. Sensory (Neurological): Unimpaired Palpation: No palpable anomalies       Provocative Tests: Hyperextension/rotation test: deferred today       Lumbar quadrant test (Kemp's test): deferred today       Lateral bending test: deferred today       Patrick's Maneuver: deferred today                   FABER test: deferred today                   S-I anterior distraction/compression test: deferred today         S-I lateral compression test: deferred today         S-I  Thigh-thrust test: deferred today         S-I Gaenslen's test: deferred today          Gait & Posture Assessment  Ambulation: Unassisted Gait: Relatively normal for age and body habitus Posture: WNL   Lower Extremity Exam    Side: Right lower extremity  Side: Left lower extremity  Stability: No instability observed          Stability: No instability observed          Skin & Extremity Inspection: Skin color, temperature, and hair growth are WNL. No peripheral edema or cyanosis. No masses, redness, swelling, asymmetry, or associated skin lesions. No contractures.  Skin & Extremity Inspection: Skin color, temperature, and hair growth are WNL. No peripheral edema or cyanosis. No masses, redness, swelling, asymmetry, or associated skin lesions. No contractures.  Functional ROM: Unrestricted ROM                  Functional ROM: Unrestricted ROM                  Muscle Tone/Strength: Functionally intact. No obvious neuro-muscular anomalies detected.   Muscle Tone/Strength: Functionally intact. No obvious neuro-muscular anomalies detected.  Sensory (Neurological): Unimpaired  Sensory (Neurological): Unimpaired  Palpation: No palpable anomalies  Palpation: No palpable anomalies   Assessment  Primary Diagnosis & Pertinent Problem List: The primary encounter diagnosis was Elbow pain, chronic, left. Diagnoses of Chronic pain of right elbow, Chronic pain of left elbow, Chronic pain syndrome, and Myofascial pain were also pertinent to this visit.  Status Diagnosis  Having a Flare-up Having a Flare-up Having a Flare-up 1. Elbow pain, chronic, left   2. Chronic pain of right elbow   3. Chronic pain of left elbow   4. Chronic pain syndrome   5. Myofascial pain      General Recommendations: The pain condition that the patient suffers from is best treated with a multidisciplinary approach that involves an increase in physical activity to prevent de-conditioning and worsening of the pain cycle, as well as psychological counseling (formal and/or informal) to address the co-morbid psychological affects of pain. Treatment will often involve judicious use of pain medications and interventional procedures to decrease the pain, allowing the patient to participate in the physical activity that will ultimately produce long-lasting pain reductions. The goal of the multidisciplinary approach is to return the patient to a higher level of overall function and to restore their ability to perform activities of daily living.  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  She states that her previous elbow joint injections performed on 06/18/17 (and prior to that 02/28/17) were beneficial for approximately 6 weeks.  She is interested in having these repeated.   We will also refill her medications as below. UDS at next visit.   Plan of Care  Pharmacotherapy (Medications Ordered): Meds ordered this encounter  Medications  . ibuprofen (ADVIL,MOTRIN) 800 MG tablet    Sig: TAKE 1 TABLET BY MOUTH TWICE A DAY WITH FOOD AS NEEDED FOR MODERATE PAIN    Dispense:  60 tablet    Refill:  3  . oxyCODONE-acetaminophen (PERCOCET) 10-325 MG tablet    Sig: Take 1 tablet by mouth every 8 (eight) hours as needed for pain.    Dispense:  90 tablet    Refill:  0  . oxyCODONE-acetaminophen (PERCOCET) 10-325 MG tablet    Sig: Take 1 tablet by mouth every 8 (eight) hours as needed for pain.    Dispense:  90 tablet    Refill:  0    Do not place this medication, or any other prescription from our practice, on "Automatic Refill". Patient may have prescription filled one day early if pharmacy is closed on scheduled refill date.  Marland Kitchen oxyCODONE-acetaminophen (PERCOCET) 10-325 MG tablet    Sig: Take 1 tablet by mouth every 8 (eight) hours as needed for pain.    Dispense:  90 tablet    Refill:  0    Do not place this medication, or any other prescription from our practice, on "Automatic Refill". Patient may have prescription filled one day early if pharmacy is closed on scheduled refill date.   Lab-work, procedure(s), and/or referral(s): Orders Placed This Encounter  Procedures  . Medium Joint Injection/Arthrocentesis   Time Note: Greater than 50% of the 25 minute(s) of face-to-face time spent with Ms. Breslin, was spent in counseling/coordination of care regarding: Ms. Fullam primary cause of pain, the treatment plan, treatment alternatives, the risks and possible complications of proposed treatment, medication side effects, going over the informed consent, the appropriate use of her medications, realistic expectations, the medication agreement and the patient's responsibilities when it comes to controlled substances.  Provider-requested follow-up: Return in about 3  weeks (around 09/18/2017) for Procedure.  Future Appointments  Date Time Provider [place]  11/27/2017  8:30 AM Gillis Santa, MD [facility] None    Primary Care Physician: Jearld Fenton, NP Location: [facility] Note by: Gillis Santa, M.D Date: 08/28/2017; Time: 8:54 AM  Patient Instructions  3 electronic prescription of Oxycodone to last until 11/30. Also, pick up prescription at pharmacy for Ibuprofen 866m.

## 2017-08-28 NOTE — Progress Notes (Signed)
Nursing Pain Medication Assessment:  Safety precautions to be maintained throughout the outpatient stay will include: orient to surroundings, keep bed in low position, maintain call bell within reach at all times, provide assistance with transfer out of bed and ambulation.  Medication Inspection Compliance: Pill count conducted under aseptic conditions, in front of the patient. Neither the pills nor the bottle was removed from the patient's sight at any time. Once count was completed pills were immediately returned to the patient in their original bottle.  Medication: Oxycodone/APAP Pill/Patch Count: 30 of 90 pills remain Pill/Patch Appearance: Markings consistent with prescribed medication Bottle Appearance: Standard pharmacy container. Clearly labeled. Filled Date: 08 / 02 / 2019 Last Medication intake:  Yesterday

## 2017-10-23 ENCOUNTER — Telehealth: Payer: Self-pay | Admitting: Internal Medicine

## 2017-10-23 NOTE — Telephone Encounter (Signed)
See below crm Appointment date 10/21   Copied from [place] [phone]. Topic: Appointment Scheduling - Scheduling Inquiry for Clinic >> Oct 23, 2017  2:20 PM Scherrie Gerlach wrote: Reason for CRM: pt works until 3:30 and wants to know if she can come in at 4:00 pm on this day for her cpe? This is a same day appt and will need to be ok'd by provider.  Thank you. Husband states please call him back

## 2017-10-24 NOTE — Telephone Encounter (Signed)
lvm asking pt's husband to call back

## 2017-10-24 NOTE — Telephone Encounter (Signed)
Tammy Lang can you help with this thanks

## 2017-10-24 NOTE — Telephone Encounter (Signed)
That will be fine, but just make her aware, she needs to be on time.

## 2017-10-29 ENCOUNTER — Encounter: Payer: Self-pay | Admitting: Internal Medicine

## 2017-10-29 ENCOUNTER — Ambulatory Visit (INDEPENDENT_AMBULATORY_CARE_PROVIDER_SITE_OTHER): Payer: 59 | Admitting: Internal Medicine

## 2017-10-29 VITALS — BP 132/84 | HR 96 | Temp 98.1°F | Ht 59.0 in | Wt 129.0 lb

## 2017-10-29 DIAGNOSIS — Z0001 Encounter for general adult medical examination with abnormal findings: Secondary | ICD-10-CM | POA: Diagnosis not present

## 2017-10-29 DIAGNOSIS — G894 Chronic pain syndrome: Secondary | ICD-10-CM | POA: Diagnosis not present

## 2017-10-29 LAB — COMPREHENSIVE METABOLIC PANEL
ALK PHOS: 67 U/L (ref 39–117)
ALT: 24 U/L (ref 0–35)
AST: 26 U/L (ref 0–37)
Albumin: 4.3 g/dL (ref 3.5–5.2)
BUN: 13 mg/dL (ref 6–23)
CHLORIDE: 102 meq/L (ref 96–112)
CO2: 33 mEq/L — ABNORMAL HIGH (ref 19–32)
Calcium: 9.4 mg/dL (ref 8.4–10.5)
Creatinine, Ser: 0.94 mg/dL (ref 0.40–1.20)
GFR: 66.15 mL/min (ref >60.00)
Glucose, Bld: 102 mg/dL — ABNORMAL HIGH (ref 70–99)
POTASSIUM: 4.1 meq/L (ref 3.5–5.1)
SODIUM: 139 meq/L (ref 135–145)
TOTAL PROTEIN: 6.8 g/dL (ref 6.0–8.3)
Total Bilirubin: 0.3 mg/dL (ref 0.2–1.2)

## 2017-10-29 LAB — LDL CHOLESTEROL, DIRECT: Direct LDL: 157 mg/dL

## 2017-10-29 LAB — VITAMIN D 25 HYDROXY (VIT D DEFICIENCY, FRACTURES): VITD: 23.55 ng/mL — AB (ref 30.00–100.00)

## 2017-10-29 LAB — CBC
HEMATOCRIT: 39.8 % (ref 36.0–46.0)
Hemoglobin: 13.3 g/dL (ref 12.0–15.0)
MCHC: 33.5 g/dL (ref 30.0–36.0)
MCV: 96.3 fl (ref 78.0–100.0)
Platelets: 201 10*3/uL (ref 150.0–400.0)
RBC: 4.14 Mil/uL (ref 3.87–5.11)
RDW: 13.7 % (ref 11.5–15.5)
WBC: 8.1 10*3/uL (ref 4.0–10.5)

## 2017-10-29 LAB — LIPID PANEL
CHOL/HDL RATIO: 6
Cholesterol: 263 mg/dL — ABNORMAL HIGH (ref 0–200)
HDL: 44.4 mg/dL (ref >39.00)
Triglycerides: 628 mg/dL — ABNORMAL HIGH (ref 0.0–149.0)

## 2017-10-29 NOTE — Patient Instructions (Signed)

## 2017-10-29 NOTE — Progress Notes (Signed)
Subjective:    Patient ID: Tammy Lang, female    DOB: [birth year], 52 y.o.   MRN: [id number]  HPI  Pt presents to the clinic today for her annual exam.   Chronic Neck Pain: She takes Percocet, Lidioderm and Zanaflex as prescribed. She follows with pain management.   Flu: 09/17/2017 Tetanus: 08/2016 Pap Smear: > 5 years ago Mammogram: ? About 5 years ago Colon Screening: 2015 Vision Screening: annually Dentist: biannually  Diet: She does eat meat. She consumes fruits and veggies daily. She tries to avoid fried foods. She drinks mostly water or coffee. Exercise: None  Review of Systems      Past Medical History:  Diagnosis Date  . Allergy   . Arthritis   . Asthma   . Lipoma of chest wall 03/23/2017  . Spinal stenosis of cervical region     Current Outpatient Medications  Medication Sig Dispense Refill  . albuterol (PROVENTIL HFA;VENTOLIN HFA) 108 (90 Base) MCG/ACT inhaler Inhale 2 puffs into the lungs every 6 (six) hours as needed for wheezing or shortness of breath. 1 Inhaler 1  . clotrimazole-betamethasone (LOTRISONE) cream Apply 1 application topically 2 (two) times daily. For up to 2 weeks 45 g 0  . diphenhydrAMINE (BENADRYL) 25 MG tablet Take 25 mg by mouth at bedtime.     Marland Kitchen ibuprofen (ADVIL,MOTRIN) 800 MG tablet TAKE 1 TABLET BY MOUTH TWICE A DAY WITH FOOD AS NEEDED FOR MODERATE PAIN 60 tablet 3  . lidocaine (LIDODERM) 5 % Place 1 patch onto the skin daily. Remove & Discard patch within 12 hours or as directed by MD 30 patch 2  . Multiple Vitamin (MULTIVITAMIN WITH MINERALS) TABS tablet Take 1 tablet by mouth daily.    . Multiple Vitamins-Minerals (HAIR SKIN AND NAILS FORMULA) TABS Take 1 tablet by mouth daily.    Marland Kitchen oxyCODONE-acetaminophen (PERCOCET) 10-325 MG tablet Take 1 tablet by mouth every 8 (eight) hours as needed for pain. 90 tablet 0  . [START ON 11/08/2017] oxyCODONE-acetaminophen (PERCOCET) 10-325 MG tablet Take 1 tablet by mouth every 8 (eight) hours  as needed for pain. 90 tablet 0  . tiZANidine (ZANAFLEX) 4 MG tablet Take 1 tablet (4 mg total) by mouth 3 (three) times daily. 90 tablet 1   No current facility-administered medications for this visit.     Allergies  Allergen Reactions  . Duloxetine     Raises BP    Family History  Problem Relation Age of Onset  . Alcohol abuse Mother   . Cervical cancer Mother   . Hodgkin's lymphoma Sister     Social History   Socioeconomic History  . Marital status: Married    Spouse name: Not on file  . Number of children: Not on file  . Years of education: Not on file  . Highest education level: Not on file  Occupational History  . Not on file  Social Needs  . Financial resource strain: Not on file  . Food insecurity:    Worry: Not on file    Inability: Not on file  . Transportation needs:    Medical: Not on file    Non-medical: Not on file  Tobacco Use  . Smoking status: Current Every Day Smoker    Packs/day: 0.50    Years: 30.00    Pack years: 15.00    Types: Cigarettes  . Smokeless tobacco: Never Used  Substance and Sexual Activity  . Alcohol use: Yes    Comment: occasional  . Drug use:  No  . Sexual activity: Yes    Birth control/protection: Surgical  Lifestyle  . Physical activity:    Days per week: Not on file    Minutes per session: Not on file  . Stress: Not on file  Relationships  . Social connections:    Talks on phone: Not on file    Gets together: Not on file    Attends religious service: Not on file    Active member of club or organization: Not on file    Attends meetings of clubs or organizations: Not on file    Relationship status: Not on file  . Intimate partner violence:    Fear of current or ex partner: Not on file    Emotionally abused: Not on file    Physically abused: Not on file    Forced sexual activity: Not on file  Other Topics Concern  . Not on file  Social History Narrative  . Not on file     Constitutional: Denies fever,  malaise, fatigue, headache or abrupt weight changes.  HEENT: Denies eye pain, eye redness, ear pain, ringing in the ears, wax buildup, runny nose, nasal congestion, bloody nose, or sore throat. Respiratory: Denies difficulty breathing, shortness of breath, cough or sputum production.   Cardiovascular: Denies chest pain, chest tightness, palpitations or swelling in the hands or feet.  Gastrointestinal: Denies abdominal pain, bloating, constipation, diarrhea or blood in the stool.  GU: Denies urgency, frequency, pain with urination, burning sensation, blood in urine, odor or discharge. Musculoskeletal: Pt reports chronic neck pain. Denies decrease in range of motion, difficulty with gait, or joint swelling.  Skin: Pt reports acne of face. Denies lesions or ulcercations.  Neurological: Denies dizziness, difficulty with memory, difficulty with speech or problems with balance and coordination.  Psych: Denies anxiety, depression, SI/HI.  No other specific complaints in a complete review of systems (except as listed in HPI above).  Objective:   Physical Exam   BP 132/84   Pulse 96   Temp 98.1 F (36.7 C) (Oral)   Ht 4\' 11"  (1.499 m)   Wt 129 lb (58.5 kg)   LMP 02/12/2011   SpO2 98%   BMI 26.05 kg/m  Wt Readings from Last 3 Encounters:  10/29/17 129 lb (58.5 kg)  08/28/17 125 lb (56.7 kg)  06/18/17 125 lb (56.7 kg)    General: Appears her stated age, well developed, well nourished in NAD. Skin: Warm, dry and intact. Acne noted on face. HEENT: Head: normal shape and size; Eyes: sclera white, no icterus, conjunctiva pink, PERRLA and EOMs intact; Ears: Tm's gray and intact, normal light reflex; Throat/Mouth: Teeth present, mucosa pink and moist, no exudate, lesions or ulcerations noted.  Neck:  Neck supple, trachea midline. No masses, lumps or thyromegaly present.  Cardiovascular: Normal rate and rhythm. S1,S2 noted.  No murmur, rubs or gallops noted. No JVD or BLE edema. No carotid bruits  noted. Pulmonary/Chest: Normal effort and positive vesicular breath sounds. No respiratory distress. No wheezes, rales or ronchi noted.  Abdomen: Soft and nontender. Normal bowel sounds. No distention or masses noted. Liver, spleen and kidneys non palpable. Musculoskeletal: Strength 5/5 BUE/BLE. No difficulty with gait.  Neurological: Alert and oriented. Cranial nerves II-XII grossly intact. Coordination normal.  Psychiatric: Mood and affect normal. Behavior is normal. Judgment and thought content normal.   CBC    Component Value Date/Time   WBC 8.0 03/20/2017 0823   RBC 4.17 03/20/2017 0823   HGB 13.5 03/20/2017 0823  HCT 40.5 03/20/2017 0823   PLT 211 03/20/2017 0823   MCV 97.1 03/20/2017 0823   MCH 32.5 03/20/2017 0823   MCHC 33.4 03/20/2017 0823   RDW 14.3 03/20/2017 0823        Assessment & Plan:   Preventative Health Maintenance:  Flu and tetanus UTD She declines pap smear and mammogram today- see will see her GYN for this Colon screening UTD Encouraged her to consume a balanced diet and exercise regimen Advised her to see an eye doctor and dentist annually Will check CBC, CMET, Lipid and Vit D  RTC in 1 year, sooner if needed Webb Silversmith, NP

## 2017-10-29 NOTE — Assessment & Plan Note (Signed)
Pain controlled with current regimen She will continue to follow with pain management

## 2017-11-01 ENCOUNTER — Ambulatory Visit: Payer: Self-pay | Admitting: *Deleted

## 2017-11-01 ENCOUNTER — Telehealth: Payer: Self-pay | Admitting: Internal Medicine

## 2017-11-01 NOTE — Telephone Encounter (Signed)
Copied from [place] [phone]. Topic: Quick Communication - Rx Refill/Question >> Nov 01, 2017  4:08 PM Tammy Lang wrote: Medication:  Xanax  Pt states that she received a text two days ago that her son was deceased - come to find out her son was actually incarcerated, but she had trouble locating him.  Pt states that she is now on edge all of the time and she would like to know if she can have Xanax.  Pt states that her pain medicine doctor (Dr. Holley Raring) states that it is okay for her to have this medication but that her PCP must prescribe for her.  Pt states that she is shaking all the time, and continues to have anxiety attacks.  Has the patient contacted their pharmacy? No.  New request (Agent: If no, request that the patient contact the pharmacy for the refill.) (Agent: If yes, when and what did the pharmacy advise?)  Preferred Pharmacy (with phone number or street name): [id number] [address], [place] [phone] (Phone) [phone] (Fax)  Agent: Please be advised that RX refills may take up to 3 business days. We ask that you follow-up with your pharmacy.

## 2017-11-01 NOTE — Telephone Encounter (Signed)
Pt called to request RX for Xanax after troubling situation at home regarding son. Pt triaged: States she was texted 2 days ago that her son was deceased; she later learned he was incarcerated, but did not know of his location. States since then she has been "Very anxious, shaky, can not sleep." Denies any suicidal ideation. No palpitations, SOB,CP; "Just shaky."  Pt has supportive husband and daughter and is speaking with EAP at [facility] as well as a Clinical biochemist. Pt was seen 10/29/17 by R. Garnette Gunner. States Rollene Fare is aware of past situations with son. Also states she saw Dr. Holley Raring who agreed with Xanax but "PCP would have to prescribe." Pt requesting prescription. Care advise given per protocol. Please advise re: medication request. CB [phone] Reason for Disposition . [1] Symptoms of anxiety or panic AND [2] has not been evaluated for this by physician  Answer Assessment - Initial Assessment Questions 1. CONCERN: "What happened that made you call today?"     News regarding son's incarceration. 2. ANXIETY SYMPTOM SCREENING: "Can you describe how you have been feeling?"  (e.g., tense, restless, panicky, anxious, keyed up, trouble sleeping, trouble concentrating)     Anxious, trouble sleeping 3. ONSET: "How long have you been feeling this way?"     2 days 4. RECURRENT: "Have you felt this way before?"  If yes: "What happened that time?" "What helped these feelings go away in the past?"      no 5. RISK OF HARM - SUICIDAL IDEATION:  "Do you ever have thoughts of hurting or killing yourself?"  (e.g., yes, no, no but preoccupation with thoughts about death)   - INTENT:  "Do you have thoughts of hurting or killing yourself right NOW?" (e.g., yes, no, N/A)   - PLAN: "Do you have a specific plan for how you would do this?" (e.g., gun, knife, overdose, no plan, N/A)     no 6. RISK OF HARM - HOMICIDAL IDEATION:  "Do you ever have thoughts of hurting or killing someone else?"  (e.g., yes, no, no but preoccupation  with thoughts about death)   - INTENT:  "Do you have thoughts of hurting or killing someone right NOW?" (e.g., yes, no, N/A)   - PLAN: "Do you have a specific plan for how you would do this?" (e.g., gun, knife, no plan, N/A)      no 7. FUNCTIONAL IMPAIRMENT: "How have things been going for you overall in your life? Have you had any more difficulties than usual doing your normal daily activities?"  (e.g., better, same, worse; self-care, school, work, interactions)     Yes, working 8. SUPPORT: "Who is with you now?" "Who do you live with?" "Do you have family or friends nearby who you can talk to?"      Husband and daughter supportive 59. THERAPIST: "Do you have a counselor or therapist? Name?"     Talking to EAP at [facility]. [employer] employee 10. STRESSORS: "Has there been any new stress or recent changes in your life?"       Son in jail; was texted he was deceased first 18. CAFFEINE ABUSE: "Do you drink caffeinated beverages, and how much each day?" (e.g., coffee, tea, colas)    some    12. SUBSTANCE ABUSE: "Do you use any illegal drugs or alcohol?"       no 13. OTHER SYMPTOMS: "Do you have any other physical symptoms right now?" (e.g., chest pain, palpitations, difficulty breathing, fever)       No,  just shaky.  Protocols used: ANXIETY AND PANIC ATTACK-A-AH

## 2017-11-02 ENCOUNTER — Other Ambulatory Visit: Payer: Self-pay | Admitting: Internal Medicine

## 2017-11-02 DIAGNOSIS — E782 Mixed hyperlipidemia: Secondary | ICD-10-CM

## 2017-11-02 DIAGNOSIS — E559 Vitamin D deficiency, unspecified: Secondary | ICD-10-CM

## 2017-11-02 MED ORDER — VITAMIN D (ERGOCALCIFEROL) 1.25 MG (50000 UNIT) PO CAPS: 50000.0000 [IU] | ORAL_CAPSULE | ORAL | 0 refills | Status: DC

## 2017-11-02 NOTE — Telephone Encounter (Signed)
I think she needs to continue EAP at [facility], make an appt to discuss anxiety. We may do Hydroxyzine, but not Xanax. This situation with her son seems to be an ongoing thing so we should discuss long term therapy instead of Xanax.

## 2017-11-05 ENCOUNTER — Telehealth: Payer: Self-pay

## 2017-11-05 NOTE — Telephone Encounter (Signed)
Pt came by office today and requested Dr Holley Raring to call Dr Marko Plume @ [phone]. Pt states that you would know what it's about or you can call her @ [phone].

## 2017-11-06 NOTE — Telephone Encounter (Signed)
Left message on voicemail.

## 2017-11-07 ENCOUNTER — Telehealth: Payer: Self-pay | Admitting: Internal Medicine

## 2017-11-07 ENCOUNTER — Telehealth: Payer: Self-pay

## 2017-11-07 MED ORDER — SIMVASTATIN 20 MG PO TABS: 20.0000 mg | ORAL_TABLET | Freq: Every day | ORAL | 2 refills | Status: DC

## 2017-11-07 NOTE — Telephone Encounter (Signed)
Pt has appt Fri at 2pm 30 min

## 2017-11-07 NOTE — Telephone Encounter (Signed)
Pt called and want's Dr Holley Raring to call  Dr Garnette Gunner [phone]) about the ok to take med's he recommended. She's not sleeping and her Anxiety is out the roof.She has tried to call Dr Garnette Gunner several times with no call returned.

## 2017-11-07 NOTE — Addendum Note (Signed)
Addended by: Lurlean Nanny on: 11/07/2017 04:58 PM   Modules accepted: Orders

## 2017-11-07 NOTE — Telephone Encounter (Signed)
Pt returning your call concerning message you left yesterday. Pt stated she couldn't understand what was being said.

## 2017-11-09 ENCOUNTER — Encounter: Payer: Self-pay | Admitting: Internal Medicine

## 2017-11-09 ENCOUNTER — Ambulatory Visit: Payer: 59 | Admitting: Internal Medicine

## 2017-11-09 VITALS — BP 132/84 | HR 105 | Temp 98.2°F | Wt 126.0 lb

## 2017-11-09 DIAGNOSIS — F419 Anxiety disorder, unspecified: Secondary | ICD-10-CM

## 2017-11-09 DIAGNOSIS — F5104 Psychophysiologic insomnia: Secondary | ICD-10-CM | POA: Diagnosis not present

## 2017-11-09 DIAGNOSIS — F329 Major depressive disorder, single episode, unspecified: Secondary | ICD-10-CM | POA: Diagnosis not present

## 2017-11-09 MED ORDER — VENLAFAXINE HCL ER 37.5 MG PO CP24: 37.5000 mg | ORAL_CAPSULE | Freq: Every day | ORAL | 2 refills | Status: DC

## 2017-11-09 MED ORDER — HYDROXYZINE HCL 10 MG PO TABS: 10.0000 mg | ORAL_TABLET | Freq: Every day | ORAL | 0 refills | Status: DC | PRN

## 2017-11-10 ENCOUNTER — Encounter: Payer: Self-pay | Admitting: Internal Medicine
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
## 2017-11-10 NOTE — Patient Instructions (Signed)

## 2017-11-27 ENCOUNTER — Encounter: Payer: Self-pay | Admitting: Student in an Organized Health Care Education/Training Program

## 2017-11-27 ENCOUNTER — Other Ambulatory Visit: Payer: Self-pay

## 2017-11-27 ENCOUNTER — Ambulatory Visit
Payer: 59 | Attending: Student in an Organized Health Care Education/Training Program | Admitting: Student in an Organized Health Care Education/Training Program

## 2017-11-27 VITALS — BP 155/97 | HR 104 | Temp 98.4°F | Resp 18 | Ht 59.0 in | Wt 126.0 lb

## 2017-11-27 DIAGNOSIS — M4722 Other spondylosis with radiculopathy, cervical region: Secondary | ICD-10-CM | POA: Diagnosis not present

## 2017-11-27 DIAGNOSIS — Z791 Long term (current) use of non-steroidal anti-inflammatories (NSAID): Secondary | ICD-10-CM | POA: Diagnosis not present

## 2017-11-27 DIAGNOSIS — M25521 Pain in right elbow: Secondary | ICD-10-CM | POA: Insufficient documentation

## 2017-11-27 DIAGNOSIS — Z5181 Encounter for therapeutic drug level monitoring: Secondary | ICD-10-CM | POA: Insufficient documentation

## 2017-11-27 DIAGNOSIS — M4802 Spinal stenosis, cervical region: Secondary | ICD-10-CM | POA: Diagnosis not present

## 2017-11-27 DIAGNOSIS — G894 Chronic pain syndrome: Secondary | ICD-10-CM | POA: Insufficient documentation

## 2017-11-27 DIAGNOSIS — G8929 Other chronic pain: Secondary | ICD-10-CM

## 2017-11-27 DIAGNOSIS — J45909 Unspecified asthma, uncomplicated: Secondary | ICD-10-CM | POA: Insufficient documentation

## 2017-11-27 DIAGNOSIS — Z760 Encounter for issue of repeat prescription: Secondary | ICD-10-CM | POA: Diagnosis not present

## 2017-11-27 DIAGNOSIS — Z79899 Other long term (current) drug therapy: Secondary | ICD-10-CM | POA: Insufficient documentation

## 2017-11-27 DIAGNOSIS — F17210 Nicotine dependence, cigarettes, uncomplicated: Secondary | ICD-10-CM | POA: Insufficient documentation

## 2017-11-27 DIAGNOSIS — G5603 Carpal tunnel syndrome, bilateral upper limbs: Secondary | ICD-10-CM | POA: Diagnosis not present

## 2017-11-27 DIAGNOSIS — F4310 Post-traumatic stress disorder, unspecified: Secondary | ICD-10-CM | POA: Insufficient documentation

## 2017-11-27 DIAGNOSIS — M25522 Pain in left elbow: Secondary | ICD-10-CM | POA: Diagnosis not present

## 2017-11-27 DIAGNOSIS — F331 Major depressive disorder, recurrent, moderate: Secondary | ICD-10-CM | POA: Diagnosis not present

## 2017-11-27 DIAGNOSIS — M47812 Spondylosis without myelopathy or radiculopathy, cervical region: Secondary | ICD-10-CM

## 2017-11-27 DIAGNOSIS — M542 Cervicalgia: Secondary | ICD-10-CM | POA: Insufficient documentation

## 2017-11-27 DIAGNOSIS — F411 Generalized anxiety disorder: Secondary | ICD-10-CM | POA: Diagnosis not present

## 2017-11-27 MED ORDER — OXYCODONE-ACETAMINOPHEN 10-325 MG PO TABS: 1.0000 | ORAL_TABLET | Freq: Three times a day (TID) | ORAL | 0 refills | Status: DC | PRN

## 2017-11-27 MED ORDER — OXYCODONE-ACETAMINOPHEN 10-325 MG PO TABS: 1.0000 | ORAL_TABLET | Freq: Three times a day (TID) | ORAL | 0 refills | Status: AC | PRN

## 2017-11-27 NOTE — Patient Instructions (Signed)
Oxycodone with acetaminophen to last until 02/06/18 has been escribed to your pharmacy.

## 2017-11-27 NOTE — Progress Notes (Signed)
Nursing Pain Medication Assessment:  Safety precautions to be maintained throughout the outpatient stay will include: orient to surroundings, keep bed in low position, maintain call bell within reach at all times, provide assistance with transfer out of bed and ambulation.  Medication Inspection Compliance: Pill count conducted under aseptic conditions, in front of the patient. Neither the pills nor the bottle was removed from the patient's sight at any time. Once count was completed pills were immediately returned to the patient in their original bottle.  Medication: Hydrocodone/APAP Pill/Patch Count: 63 of 90 pills remain Pill/Patch Appearance: Markings consistent with prescribed medication Bottle Appearance: Standard pharmacy container. Clearly labeled. Filled Date: 28 / 1 / 2019 Last Medication intake:  Yesterday

## 2017-11-27 NOTE — Progress Notes (Addendum)
Patient's Name: Tammy Lang  MRN: [id number]  Referring Provider: Jearld Fenton, NP  DOB: [birth year]  PCP: Jearld Fenton, NP  DOS: 11/27/2017  Note by: Gillis Santa, MD  Service setting: Ambulatory outpatient  Specialty: Interventional Pain Management  Location: [facility] (AMB) Pain Management Facility    Patient type: Established   Primary Reason(s) for Visit: Encounter for prescription drug management. (Level of risk: moderate)  CC: Elbow Pain (bilaterally, left is worse) and Neck Pain ("pinched")  HPI  Tammy Lang is a 53 y.o. year old, female patient, who comes today for a medication management evaluation. She has Spondylosis of cervical region without myelopathy or radiculopathy; Chronic pain syndrome; Bilateral carpal tunnel syndrome; and Chronic pain of right elbow on their problem list. Her primarily concern today is the Elbow Pain (bilaterally, left is worse) and Neck Pain ("pinched")  Pain Assessment: Location: Right, Left(left is worse) Elbow Radiating: denies Onset: More than a month ago Duration: Chronic pain Quality: Burning Severity: 8 /10 (subjective, self-reported pain score)  Note: Reported level is inconsistent with clinical observations.                         When using our objective Pain Scale, levels between 6 and 10/10 are said to belong in an emergency room, as it progressively worsens from a 6/10, described as severely limiting, requiring emergency care not usually available at an outpatient pain management facility. At a 6/10 level, communication becomes difficult and requires great effort. Assistance to reach the emergency department may be required. Facial flushing and profuse sweating along with potentially dangerous increases in heart rate and blood pressure will be evident. Effect on ADL:   Timing: Intermittent Modifying factors: meds BP: (!) 155/97  HR: (!) 104  Tammy Lang was last scheduled for an appointment on 08/28/2017 for medication management.  During today's appointment we reviewed Tammy Lang's chronic pain status, as well as her outpatient medication regimen.  Of note, in early November, patient received a phone call stating that her son was dead.  The sent her into panic mode.  This was not true.  Her son is actually incarcerated.  She has been experiencing increased anxiety and insomnia.  She discussed this with her family medicine provider.  She was prescribed Effexor.  This is much more reasonable than Xanax given that the patient is also on chronic opioid therapy as below.  The patient  reports that she does not use drugs. Her body mass index is 25.45 kg/m.  Further details on both, my assessment(s), as well as the proposed treatment plan, please see below.  Controlled Substance Pharmacotherapy Assessment REMS (Risk Evaluation and Mitigation Strategy)  Analgesic:Percocet 10 mg 3 times daily as needed, quantity 90, last fill 11/09/2017 MME/day:5m/day. WRise Patience RN  11/27/2017  8:32 AM  Signed Nursing Pain Medication Assessment:  Safety precautions to be maintained throughout the outpatient stay will include: orient to surroundings, keep bed in low position, maintain call bell within reach at all times, provide assistance with transfer out of bed and ambulation.  Medication Inspection Compliance: Pill count conducted under aseptic conditions, in front of the patient. Neither the pills nor the bottle was removed from the patient's sight at any time. Once count was completed pills were immediately returned to the patient in their original bottle.  Medication: Hydrocodone/APAP Pill/Patch Count: 63 of 90 pills remain Pill/Patch Appearance: Markings consistent with prescribed medication Bottle Appearance: Standard pharmacy container. Clearly labeled. Filled Date: 163/  1 / 2019 Last Medication intake:  Yesterday   Pharmacokinetics: Liberation and absorption (onset of action): WNL Distribution (time to peak effect):  WNL Metabolism and excretion (duration of action): WNL         Pharmacodynamics: Desired effects: Analgesia: Tammy Lang reports >50% benefit. Functional ability: Patient reports that medication allows her to accomplish basic ADLs Clinically meaningful improvement in function (CMIF): Sustained CMIF goals met Perceived effectiveness: Described as relatively effective, allowing for increase in activities of daily living (ADL) Undesirable effects: Side-effects or Adverse reactions: None reported Monitoring: [place] PMP: Online review of the past 12-monthperiod conducted. Compliant with practice rules and regulations Last UDS on record: Summary  Date Value Ref Range Status  10/17/2016 FINAL  Final    Comment:    ==================================================================== [facility] COMP DRUG ANALYSIS,UR ==================================================================== Test                             Result       Flag       Units Drug Present and Declared for Prescription Verification   Oxycodone                      470          EXPECTED   ng/mg creat   Oxymorphone                    750          EXPECTED   ng/mg creat   Noroxycodone                   1621         EXPECTED   ng/mg creat   Noroxymorphone                 257          EXPECTED   ng/mg creat    Sources of oxycodone are scheduled prescription medications.    Oxymorphone, noroxycodone, and noroxymorphone are expected    metabolites of oxycodone. Oxymorphone is also available as a    scheduled prescription medication.   Baclofen                       PRESENT      EXPECTED   Acetaminophen                  PRESENT      EXPECTED   Diphenhydramine                PRESENT      EXPECTED Drug Present not Declared for Prescription Verification   Gabapentin                     PRESENT      UNEXPECTED   Trazodone                      PRESENT      UNEXPECTED   1,3 chlorophenyl piperazine    PRESENT      UNEXPECTED     1,3-chlorophenyl piperazine is an expected metabolite of    trazodone.   Naproxen                       PRESENT      UNEXPECTED Drug Absent but Declared for Prescription Verification   Duloxetine  Not Detected UNEXPECTED   Diclofenac                     Not Detected UNEXPECTED    Diclofenac, as indicated in the declared medication list, is not    always detected even when used as directed.   Lidocaine                      Not Detected UNEXPECTED    Lidocaine, as indicated in the declared medication list, is not    always detected even when used as directed. ==================================================================== Test                      Result    Flag   Units      Ref Range   Creatinine              159              mg/dL      >=20 ==================================================================== Declared Medications:  The flagging and interpretation on this report are based on the  following declared medications.  Unexpected results may arise from  inaccuracies in the declared medications.  **Note: The testing scope of this panel includes these medications:  Baclofen (Lioresal)  Diphenhydramine (Benadryl)  Duloxetine (Cymbalta)  Oxycodone (Percocet)  **Note: The testing scope of this panel does not include small to  moderate amounts of these reported medications:  Acetaminophen (Percocet)  Diclofenac (Voltaren)  Lidocaine (Xylocaine)  **Note: The testing scope of this panel does not include following  reported medications:  Topical ==================================================================== For clinical consultation, please call [phone]. ====================================================================    UDS interpretation: Compliant          Medication Assessment Form: Reviewed. Patient indicates being compliant with therapy Treatment compliance: Compliant Risk Assessment Profile: Aberrant behavior: See prior  evaluations. None observed or detected today Comorbid factors increasing risk of overdose: See prior notes. No additional risks detected today Opioid risk tool (ORT) (Total Score): 0 Personal History of Substance Abuse (SUD-Substance use disorder):  Alcohol: Negative  Illegal Drugs: Negative  Rx Drugs: Negative  ORT Risk Level calculation: Low Risk Risk of substance use disorder (SUD): Low Opioid Risk Tool - 11/27/17 0816      Family History of Substance Abuse   Alcohol  Negative    Illegal Drugs  Negative    Rx Drugs  Negative      Personal History of Substance Abuse   Alcohol  Negative    Illegal Drugs  Negative    Rx Drugs  Negative      Age   Age between 31-45 years   No      History of Preadolescent Sexual Abuse   History of Preadolescent Sexual Abuse  Negative or Female      Psychological Disease   Psychological Disease  Negative    Depression  Negative      Total Score   Opioid Risk Tool Scoring  0    Opioid Risk Interpretation  Low Risk      ORT Scoring interpretation table:  Score <3 = Low Risk for SUD  Score between 4-7 = Moderate Risk for SUD  Score >8 = High Risk for Opioid Abuse   Risk Mitigation Strategies:  Patient Counseling: Covered Patient-Prescriber Agreement (PPA): Present and active  Notification to other healthcare providers: Done  Pharmacologic Plan: No change in therapy, at this time.  Laboratory Chemistry  Inflammation Markers (CRP: Acute Phase) (ESR: Chronic Phase) No results found for: CRP, ESRSEDRATE, LATICACIDVEN                       Rheumatology Markers No results found for: RF, ANA, LABURIC, URICUR, LYMEIGGIGMAB, LYMEABIGMQN, HLAB27                      Renal Function Markers Lab Results  Component Value Date   BUN 13 10/29/2017   CREATININE 0.94 10/29/2017                             Hepatic Function Markers Lab Results  Component Value Date   AST 26 10/29/2017   ALT 24 10/29/2017   ALBUMIN 4.3 10/29/2017    ALKPHOS 67 10/29/2017                        Electrolytes Lab Results  Component Value Date   NA 139 10/29/2017   K 4.1 10/29/2017   CL 102 10/29/2017   CALCIUM 9.4 10/29/2017                        Neuropathy Markers No results found for: VITAMINB12, FOLATE, HGBA1C, HIV                      CNS Tests No results found for: COLORCSF, APPEARCSF, RBCCOUNTCSF, WBCCSF, POLYSCSF, LYMPHSCSF, EOSCSF, PROTEINCSF, GLUCCSF, JCVIRUS, CSFOLI, IGGCSF                      Bone Pathology Markers Lab Results  Component Value Date   VD25OH 23.55 (L) 10/29/2017                         Coagulation Parameters Lab Results  Component Value Date   PLT 201.0 10/29/2017                        Cardiovascular Markers Lab Results  Component Value Date   HGB 13.3 10/29/2017   HCT 39.8 10/29/2017                         CA Markers No results found for: CEA, CA125, LABCA2                      Note: Lab results reviewed.  Recent Diagnostic Imaging Results  MR CERVICAL SPINE WO CONTRAST CLINICAL DATA:  Cervicalgia and radiculitis  EXAM: MRI CERVICAL SPINE WITHOUT CONTRAST  TECHNIQUE: Multiplanar, multisequence MR imaging of the cervical spine was performed. No intravenous contrast was administered.  COMPARISON:  None.  FINDINGS: Alignment: 3 mm retrolisthesis C4-5. Mild retrolisthesis C5-6 and C6-7. 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 Negative for fracture or mass. Normal bone marrow  Cord: Normal signal and morphology  Posterior Fossa, vertebral arteries, paraspinal tissues: Negative  Disc levels:  C2-3: Tiny central disc protrusion.  Negative for stenosis  C3-4: Congenital fusion.  Negative for stenosis  C4-5: 3 mm retrolisthesis. Moderate disc degeneration and diffuse uncinate spurring causing moderate foraminal stenosis right greater than left. Mild spinal stenosis.  C5-6: Mild retrolisthesis. Mild disc degeneration and spurring.  Mild foraminal stenosis bilaterally  C6-7: Mild retrolisthesis. Disc degeneration and bilateral uncinate spurring.  Mild foraminal narrowing bilaterally.  C7-T1: Mild anterolisthesis. Negative for stenosis. Bilateral facet degeneration.  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 Mild spinal stenosis and moderate foraminal stenosis right greater than left  Mild foraminal narrowing bilaterally C5-6 and C6-7 due to spurring.  Electronically Signed   By: Franchot Gallo M.D.   On: 03/23/2017 07:22  Complexity Note: Imaging results reviewed. Results shared with Tammy Lang, using Layman's terms.                         Meds   Current Outpatient Medications:  .  albuterol (PROVENTIL HFA;VENTOLIN HFA) 108 (90 Base) MCG/ACT inhaler, Inhale 2 puffs into the lungs every 6 (six) hours as needed for wheezing or shortness of breath., Disp: 1 Inhaler, Rfl: 1 .  clotrimazole-betamethasone (LOTRISONE) cream, Apply 1 application topically 2 (two) times daily. For up to 2 weeks, Disp: 45 g, Rfl: 0 .  diphenhydrAMINE (BENADRYL) 25 MG tablet, Take 25 mg by mouth at bedtime. , Disp: , Rfl:  .  hydrOXYzine (ATARAX/VISTARIL) 10 MG tablet, Take 1 tablet (10 mg total) by mouth daily as needed., Disp: 30 tablet, Rfl: 0 .  ibuprofen (ADVIL,MOTRIN) 800 MG tablet, TAKE 1 TABLET BY MOUTH TWICE A DAY WITH FOOD AS NEEDED FOR MODERATE PAIN, Disp: 60 tablet, Rfl: 3 .  lidocaine (LIDODERM) 5 %, Place 1 patch onto the skin daily. Remove & Discard patch within 12 hours or as directed by MD, Disp: 30 patch, Rfl: 2 .  Multiple Vitamin (MULTIVITAMIN WITH MINERALS) TABS tablet, Take 1 tablet by mouth daily., Disp: , Rfl:  .  Multiple Vitamins-Minerals (HAIR SKIN AND NAILS FORMULA) TABS, Take 1 tablet by mouth daily., Disp: , Rfl:  .  [START ON 12/08/2017] oxyCODONE-acetaminophen (PERCOCET) 10-325 MG tablet, Take 1 tablet by mouth every 8 (eight) hours as needed for pain., Disp: 90  tablet, Rfl: 0 .  venlafaxine XR (EFFEXOR-XR) 37.5 MG 24 hr capsule, Take 1 capsule (37.5 mg total) by mouth daily with breakfast., Disp: 30 capsule, Rfl: 2 .  [START ON 01/07/2018] oxyCODONE-acetaminophen (PERCOCET) 10-325 MG tablet, Take 1 tablet by mouth 3 (three) times daily as needed for pain., Disp: 90 tablet, Rfl: 0  ROS  Constitutional: Denies any fever or chills Gastrointestinal: No reported hemesis, hematochezia, vomiting, or acute GI distress Musculoskeletal: Denies any acute onset joint swelling, redness, loss of ROM, or weakness Neurological: No reported episodes of acute onset apraxia, aphasia, dysarthria, agnosia, amnesia, paralysis, loss of coordination, or loss of consciousness  Allergies  Tammy Lang is allergic to duloxetine.  PFSH  Drug: Tammy Lang  reports that she does not use drugs. Alcohol:  reports that she drinks alcohol. Tobacco:  reports that she has been smoking cigarettes. She has a 9.90 pack-year smoking history. She has never used smokeless tobacco. Medical:  has a past medical history of Allergy, Arthritis, Asthma, Lipoma of chest wall (03/23/2017), and Spinal stenosis of cervical region. Surgical: Tammy Lang  has a past surgical history that includes Cesarean section; Bunionectomy; Ganglion cyst excision; Breast surgery (Right); Dilation and curettage of uterus; and Mass excision (Left, 03/23/2017). Family: family history includes Alcohol abuse in her mother; Cervical cancer in her mother; Hodgkin's lymphoma in her sister.  Constitutional Exam  General appearance: Well nourished, well developed, and well hydrated. In no apparent acute distress Vitals:   11/27/17 0811 11/27/17 0813  BP: (!) 145/103 (!) 155/97  Pulse: (!) 104   Resp: 18  Temp: 98.4 F (36.9 C)   TempSrc: Oral   SpO2: 97%   Weight: 126 lb (57.2 kg)   Height: 4' 11"  (1.499 m)    BMI Assessment: Estimated body mass index is 25.45 kg/m as calculated from the following:   Height as  of this encounter: 4' 11"  (1.499 m).   Weight as of this encounter: 126 lb (57.2 kg).  BMI interpretation table: BMI level Category Range association with higher incidence of chronic pain  <18 kg/m2 Underweight   18.5-24.9 kg/m2 Ideal body weight   25-29.9 kg/m2 Overweight Increased incidence by 20%  30-34.9 kg/m2 Obese (Class I) Increased incidence by 68%  35-39.9 kg/m2 Severe obesity (Class II) Increased incidence by 136%  >40 kg/m2 Extreme obesity (Class III) Increased incidence by 254%   Patient's current BMI Ideal Body weight  Body mass index is 25.45 kg/m. Patient must be at least 60 in tall to calculate ideal body weight   BMI Readings from Last 4 Encounters:  11/27/17 25.45 kg/m  11/09/17 25.45 kg/m  10/29/17 26.05 kg/m  08/28/17 25.25 kg/m   Wt Readings from Last 4 Encounters:  11/27/17 126 lb (57.2 kg)  11/09/17 126 lb (57.2 kg)  10/29/17 129 lb (58.5 kg)  08/28/17 125 lb (56.7 kg)  Psych/Mental status: Alert, oriented x 3 (person, place, & time)       Eyes: PERLA Respiratory: No evidence of acute respiratory distress  Cervical Spine Area Exam  Skin & Axial Inspection: No masses, redness, edema, swelling, or associated skin lesions Alignment: Symmetrical Functional ROM: Decreased ROM      Stability: No instability detected Muscle Tone/Strength: Functionally intact. No obvious neuro-muscular anomalies detected. Sensory (Neurological): Musculoskeletal pain pattern Palpation: No palpable anomalies              Upper Extremity (UE) Exam    Side: Right upper extremity  Side: Left upper extremity  Skin & Extremity Inspection: Skin color, temperature, and hair growth are WNL. No peripheral edema or cyanosis. No masses, redness, swelling, asymmetry, or associated skin lesions. No contractures.  Skin & Extremity Inspection: Skin color, temperature, and hair growth are WNL. No peripheral edema or cyanosis. No masses, redness, swelling, asymmetry, or associated skin  lesions. No contractures.  Functional ROM: Unrestricted ROM          Functional ROM: Decreased ROM for elbow  Muscle Tone/Strength: Functionally intact. No obvious neuro-muscular anomalies detected.  Muscle Tone/Strength: Functionally intact. No obvious neuro-muscular anomalies detected.  Sensory (Neurological): Unimpaired          Sensory (Neurological): Musculoskeletal pain pattern          Palpation: No palpable anomalies              Palpation: Complains of area being tender to palpation            Left elbow  Provocative Test(s):  Phalen's test: deferred Tinel's test: deferred Apley's scratch test (touch opposite shoulder):  Action 1 (Across chest): deferred Action 2 (Overhead): deferred Action 3 (LB reach): deferred   Provocative Test(s):  Phalen's test: deferred Tinel's test: deferred Apley's scratch test (touch opposite shoulder):  Action 1 (Across chest): deferred Action 2 (Overhead): deferred Action 3 (LB reach): deferred    Thoracic Spine Area Exam  Skin & Axial Inspection: No masses, redness, or swelling Alignment: Symmetrical Functional ROM: Unrestricted ROM Stability: No instability detected Muscle Tone/Strength: Functionally intact. No obvious neuro-muscular anomalies detected. Sensory (Neurological): Unimpaired Muscle strength & Tone: No palpable anomalies  Lumbar  Spine Area Exam  Skin & Axial Inspection: No masses, redness, or swelling Alignment: Symmetrical Functional ROM: Unrestricted ROM       Stability: No instability detected Muscle Tone/Strength: Functionally intact. No obvious neuro-muscular anomalies detected. Sensory (Neurological): Unimpaired Palpation: No palpable anomalies       Provocative Tests: Hyperextension/rotation test: deferred today       Lumbar quadrant test (Kemp's test): deferred today       Lateral bending test: deferred today       Patrick's Maneuver: deferred today                   FABER test: deferred today                    S-I anterior distraction/compression test: deferred today         S-I lateral compression test: deferred today         S-I Thigh-thrust test: deferred today         S-I Gaenslen's test: deferred today          Gait & Posture Assessment  Ambulation: Unassisted Gait: Relatively normal for age and body habitus Posture: WNL   Lower Extremity Exam    Side: Right lower extremity  Side: Left lower extremity  Stability: No instability observed          Stability: No instability observed          Skin & Extremity Inspection: Skin color, temperature, and hair growth are WNL. No peripheral edema or cyanosis. No masses, redness, swelling, asymmetry, or associated skin lesions. No contractures.  Skin & Extremity Inspection: Skin color, temperature, and hair growth are WNL. No peripheral edema or cyanosis. No masses, redness, swelling, asymmetry, or associated skin lesions. No contractures.  Functional ROM: Unrestricted ROM                  Functional ROM: Unrestricted ROM                  Muscle Tone/Strength: Functionally intact. No obvious neuro-muscular anomalies detected.  Muscle Tone/Strength: Functionally intact. No obvious neuro-muscular anomalies detected.  Sensory (Neurological): Unimpaired medial portion of foot (L4)  Sensory (Neurological): Unimpaired medial portion of foot (L4)  DTR: Patellar: deferred today Achilles: deferred today Plantar: deferred today  DTR: Patellar: deferred today Achilles: deferred today Plantar: deferred today  Palpation: No palpable anomalies  Palpation: No palpable anomalies   Assessment  Primary Diagnosis & Pertinent Problem List: The primary encounter diagnosis was Chronic pain syndrome. Diagnoses of Chronic pain of right elbow, Chronic pain of left elbow, Moderate episode of recurrent major depressive disorder (HCC), Spondylosis of cervical region without myelopathy or radiculopathy, and Osteoarthritis of spine with radiculopathy, cervical region were also  pertinent to this visit.  Status Diagnosis  Persistent Having a Flare-up Having a Flare-up 1. Chronic pain syndrome   2. Chronic pain of right elbow   3. Chronic pain of left elbow   4. Moderate episode of recurrent major depressive disorder (HCC)   5. Spondylosis of cervical region without myelopathy or radiculopathy   6. Osteoarthritis of spine with radiculopathy, cervical region       General Recommendations: The pain condition that the patient suffers from is best treated with a multidisciplinary approach that involves an increase in physical activity to prevent de-conditioning and worsening of the pain cycle, as well as psychological counseling (formal and/or informal) to address the co-morbid psychological affects of pain. Treatment will often involve  judicious use of pain medications and interventional procedures to decrease the pain, allowing the patient to participate in the physical activity that will ultimately produce long-lasting pain reductions. The goal of the multidisciplinary approach is to return the patient to a higher level of overall function and to restore their ability to perform activities of daily living.  52 year old female with a history of cervical spondylosis, cervicalgia, chronic pain syndrome, chronic pain of bilateral elbow secondary to elbow osteoarthritis who presents today for medication refill.  Given episode above where she falsely thought that her son was dead that resulted in worsening of her anxiety and increased frequency of panic attacks. Patient has been started on Effexor by PCP for her worsening sx.  Her current dose is 37.5 mg.  This is a reasonable medication and much better than a short acting benzodiazepine which the patient was initially requesting.  It would be helpful for patient to see psychiatry in the management of her depression, generalized anxiety, PTSD.  Today we will repeat UDS.  Patient continues to endorse bilateral elbow pain.  She  finds bilateral elbow injections helpful.  We will schedule her for repeat bilateral elbow joint injection with steroid without sedation.  Refill medications as below.  Plan of Care  Pharmacotherapy (Medications Ordered): Meds ordered this encounter  Medications  . oxyCODONE-acetaminophen (PERCOCET) 10-325 MG tablet    Sig: Take 1 tablet by mouth every 8 (eight) hours as needed for pain.    Dispense:  90 tablet    Refill:  0    Do not place this medication, or any other prescription from our practice, on "Automatic Refill". Patient may have prescription filled one day early if pharmacy is closed on scheduled refill date.  Marland Kitchen oxyCODONE-acetaminophen (PERCOCET) 10-325 MG tablet    Sig: Take 1 tablet by mouth 3 (three) times daily as needed for pain.    Dispense:  90 tablet    Refill:  0    Do not place this medication, or any other prescription from our practice, on "Automatic Refill". Patient may have prescription filled one day early if pharmacy is closed on scheduled refill date.   Lab-work, procedure(s), and/or referral(s): Orders Placed This Encounter  Procedures  . Medium Joint Injection/Arthrocentesis  . [employer] (MW), Urine  . Ambulatory referral to Psychiatry    Time Note: Greater than 50% of the 25 minute(s) of face-to-face time spent with Tammy Lang, was spent in counseling/coordination of care regarding: Tammy Lang primary cause of pain, the treatment plan, treatment alternatives, the risks and possible complications of proposed treatment, medication side effects, going over the informed consent, the opioid analgesic risks and possible complications, the appropriate use of her medications, realistic expectations, the goals of pain management (increased in functionality), the medication agreement and the patient's responsibilities when it comes to controlled substances.  Provider-requested follow-up: Return in about 2 weeks (around 12/11/2017) for  Procedure.  Future Appointments  Date Time Provider [place]  01/24/2018  8:30 AM Gillis Santa, MD [facility] None    Primary Care Physician: Jearld Fenton, NP Location: [facility] Note by: Gillis Santa, M.D Date: 11/27/2017; Time: 8:38 AM  Patient Instructions  Oxycodone with acetaminophen to last until 02/06/18 has been escribed to your pharmacy.

## 2017-12-01 ENCOUNTER — Other Ambulatory Visit: Payer: Self-pay | Admitting: Internal Medicine

## 2017-12-01 DIAGNOSIS — F419 Anxiety disorder, unspecified: Principal | ICD-10-CM

## 2017-12-01 DIAGNOSIS — F329 Major depressive disorder, single episode, unspecified: Secondary | ICD-10-CM

## 2017-12-01 LAB — TOXASSURE SELECT 13 (MW), URINE

## 2017-12-04 NOTE — Telephone Encounter (Signed)
Please advise if okay to send in 90 day supply to pharmacy

## 2017-12-12 ENCOUNTER — Ambulatory Visit
Payer: 59 | Attending: Student in an Organized Health Care Education/Training Program | Admitting: Student in an Organized Health Care Education/Training Program

## 2017-12-12 ENCOUNTER — Encounter: Payer: Self-pay | Admitting: Student in an Organized Health Care Education/Training Program

## 2017-12-12 ENCOUNTER — Other Ambulatory Visit: Payer: Self-pay

## 2017-12-12 VITALS — BP 112/80 | HR 97 | Temp 98.0°F | Resp 16 | Ht 59.0 in | Wt 128.0 lb

## 2017-12-12 DIAGNOSIS — Z79899 Other long term (current) drug therapy: Secondary | ICD-10-CM | POA: Diagnosis not present

## 2017-12-12 DIAGNOSIS — Z791 Long term (current) use of non-steroidal anti-inflammatories (NSAID): Secondary | ICD-10-CM | POA: Diagnosis not present

## 2017-12-12 DIAGNOSIS — F17210 Nicotine dependence, cigarettes, uncomplicated: Secondary | ICD-10-CM | POA: Diagnosis not present

## 2017-12-12 DIAGNOSIS — M5442 Lumbago with sciatica, left side: Secondary | ICD-10-CM | POA: Diagnosis not present

## 2017-12-12 DIAGNOSIS — M545 Low back pain: Secondary | ICD-10-CM | POA: Diagnosis present

## 2017-12-12 DIAGNOSIS — G5603 Carpal tunnel syndrome, bilateral upper limbs: Secondary | ICD-10-CM | POA: Insufficient documentation

## 2017-12-12 DIAGNOSIS — M7918 Myalgia, other site: Secondary | ICD-10-CM

## 2017-12-12 DIAGNOSIS — G8929 Other chronic pain: Secondary | ICD-10-CM

## 2017-12-12 DIAGNOSIS — M47812 Spondylosis without myelopathy or radiculopathy, cervical region: Secondary | ICD-10-CM | POA: Diagnosis not present

## 2017-12-12 DIAGNOSIS — G894 Chronic pain syndrome: Secondary | ICD-10-CM

## 2017-12-12 MED ORDER — ORPHENADRINE CITRATE 30 MG/ML IJ SOLN
30.0000 mg | Freq: Once | INTRAMUSCULAR | Status: AC
  Administered 2017-12-12: 30 mg via INTRAMUSCULAR

## 2017-12-12 MED ORDER — KETOROLAC TROMETHAMINE 30 MG/ML IJ SOLN
30.0000 mg | Freq: Once | INTRAMUSCULAR | Status: AC
  Administered 2017-12-12: 30 mg via INTRAMUSCULAR

## 2017-12-12 MED ORDER — ORPHENADRINE CITRATE 30 MG/ML IJ SOLN
INTRAMUSCULAR | Status: AC
  Filled 2017-12-12: qty 2

## 2017-12-12 MED ORDER — METHYLPREDNISOLONE 4 MG PO TBPK: ORAL_TABLET | ORAL | 0 refills | Status: AC

## 2017-12-12 MED ORDER — KETOROLAC TROMETHAMINE 30 MG/ML IJ SOLN
INTRAMUSCULAR | Status: AC
  Filled 2017-12-12: qty 1

## 2017-12-12 NOTE — Progress Notes (Signed)
Patient's Name: Tammy Lang  MRN: [id number]  Referring Provider: Jearld Fenton, NP  DOB: 1965-06-05  PCP: Jearld Fenton, NP  DOS: 12/12/2017  Note by: Gillis Santa, MD  Service setting: Ambulatory outpatient  Specialty: Interventional Pain Management  Location: [facility] (AMB) Pain Management Facility    Patient type: Established   Primary Reason(s) for Visit: Evaluation of chronic illnesses with exacerbation, or progression (Level of risk: moderate) CC: Back Pain (lower)  HPI  Tammy Lang is a 52 y.o. year old, female patient, who comes today for a follow-up evaluation. She has Spondylosis of cervical region without myelopathy or radiculopathy; Chronic pain syndrome; Bilateral carpal tunnel syndrome; Chronic pain of right elbow; and Chronic left-sided low back pain with left-sided sciatica on their problem list. Tammy Lang was last seen on 11/27/2017. Her primarily concern today is the Back Pain (lower)  Pain Assessment: Location: Left Back Radiating: left hip down side of leg to knee Onset: More than a month ago Duration: Chronic pain Quality: Aching, Constant, Pins and needles(pinching) Severity: 7 /10 (subjective, self-reported pain score)  Effect on ADL: prolonged walking, standing Timing: Constant Modifying factors: nothing BP: 112/80  HR: 97  Further details on both, my assessment(s), as well as the proposed treatment plan, please see below.  Patient presented to clinic with out an appointment given acute pain flare ( I was not in the clinic at this time, however nursing staff did call me to discuss patient's HPI and sx).  Patient complaining of left back pain that radiates down to her left hip down to her left knee. No weakness, bowler or bladder issues.  Patient was triaged and seen by the nursing staff.  Contacted me about her symptoms.  We discussed intramuscular Norflex and Toradol along with steroid taper. Cr WNL.  Patient in agreement.  Laboratory Chemistry   Inflammation Markers (CRP: Acute Phase) (ESR: Chronic Phase) No results found for: CRP, ESRSEDRATE, LATICACIDVEN                       Rheumatology Markers No results found for: RF, ANA, LABURIC, URICUR, LYMEIGGIGMAB, LYMEABIGMQN, HLAB27                      Renal Function Markers Lab Results  Component Value Date   BUN 13 10/29/2017   CREATININE 0.94 10/29/2017                             Hepatic Function Markers Lab Results  Component Value Date   AST 26 10/29/2017   ALT 24 10/29/2017   ALBUMIN 4.3 10/29/2017   ALKPHOS 67 10/29/2017                        Electrolytes Lab Results  Component Value Date   NA 139 10/29/2017   K 4.1 10/29/2017   CL 102 10/29/2017   CALCIUM 9.4 10/29/2017                        Neuropathy Markers No results found for: VITAMINB12, FOLATE, HGBA1C, HIV                      CNS Tests No results found for: COLORCSF, APPEARCSF, RBCCOUNTCSF, WBCCSF, POLYSCSF, LYMPHSCSF, EOSCSF, PROTEINCSF, GLUCCSF, JCVIRUS, CSFOLI, IGGCSF  Bone Pathology Markers Lab Results  Component Value Date   VD25OH 23.55 (L) 10/29/2017                         Coagulation Parameters Lab Results  Component Value Date   PLT 201.0 10/29/2017                        Cardiovascular Markers Lab Results  Component Value Date   HGB 13.3 10/29/2017   HCT 39.8 10/29/2017                         CA Markers No results found for: CEA, CA125, LABCA2                      Note: Lab results reviewed.  Recent Diagnostic Imaging Review  Cervical Imaging: Cervical MR wo contrast:  Results for orders placed during the hospital encounter of 03/22/17  MR CERVICAL SPINE WO CONTRAST   Narrative CLINICAL DATA:  Cervicalgia and radiculitis  EXAM: MRI CERVICAL SPINE WITHOUT CONTRAST  TECHNIQUE: Multiplanar, multisequence MR imaging of the cervical spine was performed. No intravenous contrast was administered.  COMPARISON:  None.  FINDINGS: Alignment:  3 mm retrolisthesis C4-5. Mild retrolisthesis C5-6 and C6-7. 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 Negative for fracture or mass. Normal bone marrow  Cord: Normal signal and morphology  Posterior Fossa, vertebral arteries, paraspinal tissues: Negative  Disc levels:  C2-3: Tiny central disc protrusion.  Negative for stenosis  C3-4: Congenital fusion.  Negative for stenosis  C4-5: 3 mm retrolisthesis. Moderate disc degeneration and diffuse uncinate spurring causing moderate foraminal stenosis right greater than left. Mild spinal stenosis.  C5-6: Mild retrolisthesis. Mild disc degeneration and spurring. Mild foraminal stenosis bilaterally  C6-7: Mild retrolisthesis. Disc degeneration and bilateral uncinate spurring. Mild foraminal narrowing bilaterally.  C7-T1: Mild anterolisthesis. Negative for stenosis. Bilateral facet degeneration.  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 Mild spinal stenosis and moderate foraminal stenosis right greater than left  Mild foraminal narrowing bilaterally C5-6 and C6-7 due to spurring.   Electronically Signed   By: Franchot Gallo M.D.   On: 03/23/2017 07:22     Complexity Note: Imaging results reviewed. Results shared with Tammy Lang, using Layman's terms.                         Meds   Current Outpatient Medications:  .  albuterol (PROVENTIL HFA;VENTOLIN HFA) 108 (90 Base) MCG/ACT inhaler, Inhale 2 puffs into the lungs every 6 (six) hours as needed for wheezing or shortness of breath., Disp: 1 Inhaler, Rfl: 1 .  clotrimazole-betamethasone (LOTRISONE) cream, Apply 1 application topically 2 (two) times daily. For up to 2 weeks, Disp: 45 g, Rfl: 0 .  diphenhydrAMINE (BENADRYL) 25 MG tablet, Take 25 mg by mouth at bedtime. , Disp: , Rfl:  .  hydrOXYzine (ATARAX/VISTARIL) 10 MG tablet, TAKE 1 TABLET BY MOUTH EVERY DAY AS NEEDED, Disp: 90 tablet, Rfl: 0 .  ibuprofen  (ADVIL,MOTRIN) 800 MG tablet, TAKE 1 TABLET BY MOUTH TWICE A DAY WITH FOOD AS NEEDED FOR MODERATE PAIN, Disp: 60 tablet, Rfl: 3 .  lidocaine (LIDODERM) 5 %, Place 1 patch onto the skin daily. Remove & Discard patch within 12 hours or as directed by MD, Disp: 30 patch, Rfl: 2 .  Multiple Vitamin (MULTIVITAMIN WITH  MINERALS) TABS tablet, Take 1 tablet by mouth daily., Disp: , Rfl:  .  Multiple Vitamins-Minerals (HAIR SKIN AND NAILS FORMULA) TABS, Take 1 tablet by mouth daily., Disp: , Rfl:  .  oxyCODONE-acetaminophen (PERCOCET) 10-325 MG tablet, Take 1 tablet by mouth every 8 (eight) hours as needed for pain., Disp: 90 tablet, Rfl: 0 .  [START ON 01/07/2018] oxyCODONE-acetaminophen (PERCOCET) 10-325 MG tablet, Take 1 tablet by mouth 3 (three) times daily as needed for pain., Disp: 90 tablet, Rfl: 0 .  venlafaxine XR (EFFEXOR-XR) 37.5 MG 24 hr capsule, TAKE 1 CAPSULE (37.5 MG TOTAL) BY MOUTH DAILY WITH BREAKFAST., Disp: 90 capsule, Rfl: 0 .  methylPREDNISolone (MEDROL) 4 MG TBPK tablet, Follow package instructions., Disp: 21 tablet, Rfl: 0  Current Facility-Administered Medications:  .  ketorolac (TORADOL) 30 MG/ML injection 30 mg, 30 mg, Intramuscular, Once, Genessa Beman, MD .  orphenadrine (NORFLEX) injection 30 mg, 30 mg, Intramuscular, Once, Yarithza Mink, MD  ROS  Constitutional: Denies any fever or chills Gastrointestinal: No reported hemesis, hematochezia, vomiting, or acute GI distress Musculoskeletal: Denies any acute onset joint swelling, redness, loss of ROM, or weakness Neurological: No reported episodes of acute onset apraxia, aphasia, dysarthria, agnosia, amnesia, paralysis, loss of coordination, or loss of consciousness  Allergies  Tammy Lang is allergic to duloxetine.  PFSH  Drug: Tammy Lang  reports that she does not use drugs. Alcohol:  reports that she drinks alcohol. Tobacco:  reports that she has been smoking cigarettes. She has a 9.90 pack-year smoking history. She  has never used smokeless tobacco. Medical:  has a past medical history of Allergy, Arthritis, Asthma, Lipoma of chest wall (03/23/2017), and Spinal stenosis of cervical region. Surgical: Tammy Lang  has a past surgical history that includes Cesarean section; Bunionectomy; Ganglion cyst excision; Breast surgery (Right); Dilation and curettage of uterus; and Mass excision (Left, 03/23/2017). Family: family history includes Alcohol abuse in her mother; Cervical cancer in her mother; Hodgkin's lymphoma in her sister.  Constitutional Exam   Vitals:   12/12/17 1225  BP: 112/80  Pulse: 97  Resp: 16  Temp: 98 F (36.7 C)  SpO2: 98%  Weight: 128 lb (58.1 kg)  Height: _0  (1.499 m)   BMI Assessment: Estimated body mass index is 25.85 kg/m as calculated from the following:   Height as of this encounter: _1  (1.499 m).   Weight as of this encounter: 128 lb (58.1 kg).  No physical exam done   BMI interpretation table: BMI level Category Range association with higher incidence of chronic pain  <18 kg/m2 Underweight   18.5-24.9 kg/m2 Ideal body weight   25-29.9 kg/m2 Overweight Increased incidence by 20%  30-34.9 kg/m2 Obese (Class I) Increased incidence by 68%  35-39.9 kg/m2 Severe obesity (Class II) Increased incidence by 136%  >40 kg/m2 Extreme obesity (Class III) Increased incidence by 254%   Patient's current BMI Ideal Body weight  Body mass index is 25.85 kg/m. Patient must be at least 60 in tall to calculate ideal body weight   BMI Readings from Last 4 Encounters:  12/12/17 25.85 kg/m  11/27/17 25.45 kg/m  11/09/17 25.45 kg/m  10/29/17 26.05 kg/m   Wt Readings from Last 4 Encounters:  12/12/17 128 lb (58.1 kg)  11/27/17 126 lb (57.2 kg)  11/09/17 126 lb (57.2 kg)  10/29/17 129 lb (58.5 kg)   Assessment  Primary Diagnosis & Pertinent Problem List: The primary encounter diagnosis was Chronic pain syndrome. Diagnoses of Chronic left-sided low back pain with  left-sided sciatica and Myofascial pain were also pertinent to this visit.  Status Diagnosis  Having a Flare-up Having a Flare-up Having a Flare-up 1. Chronic pain syndrome   2. Chronic left-sided low back pain with left-sided sciatica   3. Myofascial pain     Problems updated and reviewed during this visit: Problem  Chronic Left-Sided Low Back Pain With Left-Sided Sciatica   Plan of Care  Pharmacotherapy (Medications Ordered): Meds ordered this encounter  Medications  . orphenadrine (NORFLEX) injection 30 mg  . ketorolac (TORADOL) 30 MG/ML injection 30 mg  . methylPREDNISolone (MEDROL) 4 MG TBPK tablet    Sig: Follow package instructions.    Dispense:  21 tablet    Refill:  0    Do not add to the "Automatic Refill" notification system.    Future Appointments  Date Time Provider [place]  12/12/2017  2:00 PM Gillis Santa, MD [facility] None  12/26/2017  3:00 PM Ursula Alert, MD [facility] None  01/24/2018  8:30 AM Gillis Santa, MD [facility] None    Primary Care Physician: Jearld Fenton, NP Location: [facility] Note by: Gillis Santa, M.D Date: 12/12/2017; Time: 1:33 PM  There are no Patient Instructions on file for this visit.

## 2017-12-12 NOTE — Progress Notes (Signed)
Safety precautions to be maintained throughout the outpatient stay will include: orient to surroundings, keep bed in low position, maintain call bell within reach at all times, provide assistance with transfer out of bed and ambulation.  

## 2017-12-26 ENCOUNTER — Other Ambulatory Visit: Payer: Self-pay

## 2017-12-26 ENCOUNTER — Encounter: Payer: Self-pay | Admitting: Psychiatry

## 2017-12-26 ENCOUNTER — Ambulatory Visit: Payer: 59 | Admitting: Psychiatry

## 2017-12-26 VITALS — BP 147/85 | HR 82 | Temp 98.9°F | Wt 127.8 lb

## 2017-12-26 DIAGNOSIS — F330 Major depressive disorder, recurrent, mild: Secondary | ICD-10-CM

## 2017-12-26 DIAGNOSIS — F5105 Insomnia due to other mental disorder: Secondary | ICD-10-CM | POA: Diagnosis not present

## 2017-12-26 DIAGNOSIS — F411 Generalized anxiety disorder: Secondary | ICD-10-CM | POA: Diagnosis not present

## 2017-12-26 MED ORDER — TRAZODONE HCL 100 MG PO TABS: 150.0000 mg | ORAL_TABLET | Freq: Every day | ORAL | 0 refills | Status: DC

## 2017-12-26 MED ORDER — VENLAFAXINE HCL ER 37.5 MG PO CP24: 75.0000 mg | ORAL_CAPSULE | Freq: Every day | ORAL | 0 refills | Status: DC

## 2017-12-26 MED ORDER — HYDROXYZINE PAMOATE 25 MG PO CAPS: 25.0000 mg | ORAL_CAPSULE | Freq: Three times a day (TID) | ORAL | 1 refills | Status: DC | PRN
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
## 2017-12-26 NOTE — Progress Notes (Deleted)
[place] MD OP Progress Note  12/26/2017 1:11 PM Tammy Lang  MRN:  [id number]  Chief Complaint:  HPI: *** Visit Diagnosis: No diagnosis found.  Past Psychiatric History: ***  Past Medical History:  Past Medical History:  Diagnosis Date  . Allergy   . Arthritis   . Asthma   . Lipoma of chest wall 03/23/2017  . Spinal stenosis of cervical region     Past Surgical History:  Procedure Laterality Date  . BREAST SURGERY Right    benign tumor  . BUNIONECTOMY    . CESAREAN SECTION    . DILATION AND CURETTAGE OF UTERUS    . GANGLION CYST EXCISION    . MASS EXCISION Left 03/23/2017   Procedure: EXCISION OF LEFT CHEST WALL MASS;  Surgeon: Fanny Skates, MD;  Location: [facility] ORS;  Service: General;  Laterality: Left;    Family Psychiatric History: ***  Family History:  Family History  Problem Relation Age of Onset  . Alcohol abuse Mother   . Cervical cancer Mother   . Hodgkin's lymphoma Sister     Social History:  Social History   Socioeconomic History  . Marital status: Married    Spouse name: Not on file  . Number of children: Not on file  . Years of education: Not on file  . Highest education level: Not on file  Occupational History  . Not on file  Social Needs  . Financial resource strain: Not on file  . Food insecurity:    Worry: Not on file    Inability: Not on file  . Transportation needs:    Medical: Not on file    Non-medical: Not on file  Tobacco Use  . Smoking status: Current Every Day Smoker    Packs/day: 0.33    Years: 30.00    Pack years: 9.90    Types: Cigarettes  . Smokeless tobacco: Never Used  Substance and Sexual Activity  . Alcohol use: Yes    Comment: occasional  . Drug use: No  . Sexual activity: Yes    Birth control/protection: Surgical  Lifestyle  . Physical activity:    Days per week: Not on file    Minutes per session: Not on file  . Stress: Not on file  Relationships  . Social connections:    Talks on phone: Not on file     Gets together: Not on file    Attends religious service: Not on file    Active member of club or organization: Not on file    Attends meetings of clubs or organizations: Not on file    Relationship status: Not on file  Other Topics Concern  . Not on file  Social History Narrative  . Not on file    Allergies:  Allergies  Allergen Reactions  . Duloxetine     Raises BP    Metabolic Disorder Labs: No results found for: HGBA1C, MPG No results found for: PROLACTIN Lab Results  Component Value Date   CHOL 263 (H) 10/29/2017   TRIG (H) 10/29/2017    628.0 Triglyceride is over 400; calculations on Lipids are invalid.   HDL 44.40 10/29/2017   CHOLHDL 6 10/29/2017   No results found for: TSH  Therapeutic Level Labs: No results found for: LITHIUM No results found for: VALPROATE No components found for:  CBMZ  Current Medications: Current Outpatient Medications  Medication Sig Dispense Refill  . albuterol (PROVENTIL HFA;VENTOLIN HFA) 108 (90 Base) MCG/ACT inhaler Inhale 2 puffs into the lungs every  6 (six) hours as needed for wheezing or shortness of breath. 1 Inhaler 1  . clotrimazole-betamethasone (LOTRISONE) cream Apply 1 application topically 2 (two) times daily. For up to 2 weeks 45 g 0  . diphenhydrAMINE (BENADRYL) 25 MG tablet Take 25 mg by mouth at bedtime.     . hydrOXYzine (ATARAX/VISTARIL) 10 MG tablet TAKE 1 TABLET BY MOUTH EVERY DAY AS NEEDED 90 tablet 0  . ibuprofen (ADVIL,MOTRIN) 800 MG tablet TAKE 1 TABLET BY MOUTH TWICE A DAY WITH FOOD AS NEEDED FOR MODERATE PAIN 60 tablet 3  . lidocaine (LIDODERM) 5 % Place 1 patch onto the skin daily. Remove & Discard patch within 12 hours or as directed by MD 30 patch 2  . Multiple Vitamin (MULTIVITAMIN WITH MINERALS) TABS tablet Take 1 tablet by mouth daily.    . Multiple Vitamins-Minerals (HAIR SKIN AND NAILS FORMULA) TABS Take 1 tablet by mouth daily.    Marland Kitchen oxyCODONE-acetaminophen (PERCOCET) 10-325 MG tablet Take 1 tablet  by mouth every 8 (eight) hours as needed for pain. 90 tablet 0  . [START ON 01/07/2018] oxyCODONE-acetaminophen (PERCOCET) 10-325 MG tablet Take 1 tablet by mouth 3 (three) times daily as needed for pain. 90 tablet 0  . venlafaxine XR (EFFEXOR-XR) 37.5 MG 24 hr capsule TAKE 1 CAPSULE (37.5 MG TOTAL) BY MOUTH DAILY WITH BREAKFAST. 90 capsule 0   No current facility-administered medications for this visit.      Musculoskeletal: Strength & Muscle Tone: {desc; muscle tone:32375} Gait & Station: {PE GAIT ED IAXK:55374} Patient leans: {Patient Leans:21022755}  Psychiatric Specialty Exam: ROS  Last menstrual period 02/12/2011.There is no height or weight on file to calculate BMI.  General Appearance: {Appearance:22683}  Eye Contact:  {BHH EYE CONTACT:22684}  Speech:  {Speech:22685}  Volume:  {Volume (PAA):22686}  Mood:  {BHH MOOD:22306}  Affect:  {Affect (PAA):22687}  Thought Process:  {Thought Process (PAA):22688}  Orientation:  {BHH ORIENTATION (PAA):22689}  Thought Content: {Thought Content:22690}   Suicidal Thoughts:  {ST/HT (PAA):22692}  Homicidal Thoughts:  {ST/HT (PAA):22692}  Memory:  {BHH MEMORY:22881}  Judgement:  {Judgement (PAA):22694}  Insight:  {Insight (PAA):22695}  Psychomotor Activity:  {Psychomotor (PAA):22696}  Concentration:  {Concentration:21399}  Recall:  {BHH GOOD/FAIR/POOR:22877}  Fund of Knowledge: {BHH GOOD/FAIR/POOR:22877}  Language: {BHH GOOD/FAIR/POOR:22877}  Akathisia:  {BHH YES OR NO:22294}  Handed:  {Handed:22697}  AIMS (if indicated): {Desc; done/not:10129}  Assets:  {Assets (PAA):22698}  ADL's:  {BHH MOL'M:78675}  Cognition: {chl bhh cognition:304700322}  Sleep:  {BHH GOOD/FAIR/POOR:22877}   Screenings: GAD-7     Office Visit from 11/09/2017 in [place] at [facility]  Total GAD-7 Score  16    PHQ2-9     Clinical Support from 11/27/2017 in [place] Office Visit from 10/29/2017 in  [place] at [place] from 08/28/2017 in [place] from 05/29/2017 in [place] Office Visit from 03/13/2017 in [place]  PHQ-2 Total Score  0  0  0  0  0       Assessment and Plan: ***   Ursula Alert, MD 12/26/2017, 1:11 PM

## 2017-12-26 NOTE — Patient Instructions (Signed)
Trazodone tablets What is this medicine? TRAZODONE (TRAZ oh done) is used to treat depression. This medicine may be used for other purposes; ask your health care provider or pharmacist if you have questions. COMMON BRAND NAME(S): Desyrel What should I tell my health care provider before I take this medicine? They need to know if you have any of these conditions: -attempted suicide or thinking about it -bipolar disorder -bleeding problems -glaucoma -heart disease, or previous heart attack -irregular heart beat -kidney or liver disease -low levels of sodium in the blood -an unusual or allergic reaction to trazodone, other medicines, foods, dyes or preservatives -pregnant or trying to get pregnant -breast-feeding How should I use this medicine? Take this medicine by mouth with a glass of water. Follow the directions on the prescription label. Take this medicine shortly after a meal or a light snack. Take your medicine at regular intervals. Do not take your medicine more often than directed. Do not stop taking this medicine suddenly except upon the advice of your doctor. Stopping this medicine too quickly may cause serious side effects or your condition may worsen. A special MedGuide will be given to you by the pharmacist with each prescription and refill. Be sure to read this information carefully each time. Talk to your pediatrician regarding the use of this medicine in children. Special care may be needed. Overdosage: If you think you have taken too much of this medicine contact a poison control center or emergency room at once. NOTE: This medicine is only for you. Do not share this medicine with others. What if I miss a dose? If you miss a dose, take it as soon as you can. If it is almost time for your next dose, take only that dose. Do not take double or extra doses. What may interact with this medicine? Do not take this medicine with any of the following medications: -certain medicines  for fungal infections like fluconazole, itraconazole, ketoconazole, posaconazole, voriconazole -cisapride -dofetilide -dronedarone -linezolid -MAOIs like Carbex, Eldepryl, Marplan, Nardil, and Parnate -mesoridazine -methylene blue (injected into a vein) -pimozide -saquinavir -thioridazine This medicine may also interact with the following medications: -alcohol -antiviral medicines for HIV or AIDS -aspirin and aspirin-like medicines -barbiturates like phenobarbital -certain medicines for blood pressure, heart disease, irregular heart beat -certain medicines for depression, anxiety, or psychotic disturbances -certain medicines for migraine headache like almotriptan, eletriptan, frovatriptan, naratriptan, rizatriptan, sumatriptan, zolmitriptan -certain medicines for seizures like carbamazepine and phenytoin -certain medicines for sleep -certain medicines that treat or prevent blood clots like dalteparin, enoxaparin, warfarin -digoxin -fentanyl -lithium -NSAIDS, medicines for pain and inflammation, like ibuprofen or naproxen -other medicines that prolong the QT interval (cause an abnormal heart rhythm) -rasagiline -supplements like St. John's wort, kava kava, valerian -tramadol -tryptophan This list may not describe all possible interactions. Give your health care provider a list of all the medicines, herbs, non-prescription drugs, or dietary supplements you use. Also tell them if you smoke, drink alcohol, or use illegal drugs. Some items may interact with your medicine. What should I watch for while using this medicine? Tell your doctor if your symptoms do not get better or if they get worse. Visit your doctor or health care professional for regular checks on your progress. Because it may take several weeks to see the full effects of this medicine, it is important to continue your treatment as prescribed by your doctor. Patients and their families should watch out for new or worsening  thoughts of suicide or depression. Also watch   out for sudden changes in feelings such as feeling anxious, agitated, panicky, irritable, hostile, aggressive, impulsive, severely restless, overly excited and hyperactive, or not being able to sleep. If this happens, especially at the beginning of treatment or after a change in dose, call your health care professional. Dennis Bast may get drowsy or dizzy. Do not drive, use machinery, or do anything that needs mental alertness until you know how this medicine affects you. Do not stand or sit up quickly, especially if you are an older patient. This reduces the risk of dizzy or fainting spells. Alcohol may interfere with the effect of this medicine. Avoid alcoholic drinks. This medicine may cause dry eyes and blurred vision. If you wear contact lenses you may feel some discomfort. Lubricating drops may help. See your eye doctor if the problem does not go away or is severe. Your mouth may get dry. Chewing sugarless gum, sucking hard candy and drinking plenty of water may help. Contact your doctor if the problem does not go away or is severe. What side effects may I notice from receiving this medicine? Side effects that you should report to your doctor or health care professional as soon as possible: -allergic reactions like skin rash, itching or hives, swelling of the face, lips, or tongue -elevated mood, decreased need for sleep, racing thoughts, impulsive behavior -confusion -fast, irregular heartbeat -feeling faint or lightheaded, falls -feeling agitated, angry, or irritable -loss of balance or coordination -painful or prolonged erections -restlessness, pacing, inability to keep still -suicidal thoughts or other mood changes -tremors -trouble sleeping -seizures -unusual bleeding or bruising Side effects that usually do not require medical attention (report to your doctor or health care professional if they continue or are bothersome): -change in sex drive or  performance -change in appetite or weight -constipation -headache -muscle aches or pains -nausea This list may not describe all possible side effects. Call your doctor for medical advice about side effects. You may report side effects to FDA at 1-800-FDA-1088. Where should I keep my medicine? Keep out of the reach of children. Store at room temperature between 15 and 30 degrees C (59 to 86 degrees F). Protect from light. Keep container tightly closed. Throw away any unused medicine after the expiration date. NOTE: This sheet is a summary. It may not cover all possible information. If you have questions about this medicine, talk to your doctor, pharmacist, or health care provider.  2019 Elsevier/Gold Standard (2017-03-06 17:51:24)   Please get your TSH done with your PMD.

## 2018-01-07 ENCOUNTER — Encounter: Payer: Self-pay | Admitting: Psychiatry

## 2018-01-07 ENCOUNTER — Other Ambulatory Visit: Payer: Self-pay

## 2018-01-07 ENCOUNTER — Ambulatory Visit (INDEPENDENT_AMBULATORY_CARE_PROVIDER_SITE_OTHER): Payer: 59 | Admitting: Psychiatry

## 2018-01-07 VITALS — BP 174/97 | HR 92 | Temp 99.3°F | Wt 127.2 lb

## 2018-01-07 DIAGNOSIS — F5105 Insomnia due to other mental disorder: Secondary | ICD-10-CM | POA: Diagnosis not present

## 2018-01-07 DIAGNOSIS — F411 Generalized anxiety disorder: Secondary | ICD-10-CM | POA: Diagnosis not present

## 2018-01-07 DIAGNOSIS — F330 Major depressive disorder, recurrent, mild: Secondary | ICD-10-CM | POA: Diagnosis not present

## 2018-01-07 NOTE — Progress Notes (Signed)
[facility] MD OP Progress Note  01/07/2018 5:17 PM Tammy Lang  MRN:  [id number]  Chief Complaint: ' I am here for follow up.' Chief Complaint    Follow-up     HPI: Tammy Lang is a 52 year old Hispanic female, married, employed, lives in [place], has a history of depression, anxiety, chronic pain, hyperlipidemia, presented to the clinic today for a follow-up visit.  Patient today reports she has been struggling with bronchitis and was recently on prednisone.  She hence continues to have some sleep problems.  She reports the trazodone initially worked for her however she started having restlessness at night soon after that.  She reports she went up to 200 mg of trazodone and continued to have some restlessness.  She however does not want to change her trazodone yet and want to give it more time.  She reports she also relocated to a new house and that could have also contributed to her anxiety symptoms and sleep problems.  She reports she is tolerating the Effexor well at the current dosage.  Patient denies any suicidality, perceptual disturbances or homicidality.  Patient reports her Christmas holidays went well.  She reports she was able to talk to her son who is currently in rehab in [place] for drug abuse problems.  She reports she felt happy since she felt like he was getting better.  She just hopes that he continues to do well.  Patient denies any other concerns today Visit Diagnosis:    ICD-10-CM   1. GAD (generalized anxiety disorder) F41.1   2. MDD (major depressive disorder), recurrent episode, mild ([place]) F33.0   3. Insomnia due to mental condition F51.05     Past Psychiatric History: Reviewed past psychiatric history from my progress note on 12/26/2017.  Past trials of Wellbutrin, Cymbalta-for pain, venlafaxine, hydroxyzine, trazodone, Xanax   Past Medical History:  Past Medical History:  Diagnosis Date  . Allergy   . Anxiety   . Arthritis   . Asthma   . Lipoma of  chest wall 03/23/2017  . Spinal stenosis of cervical region     Past Surgical History:  Procedure Laterality Date  . BREAST SURGERY Right    benign tumor  . BUNIONECTOMY    . CESAREAN SECTION    . DILATION AND CURETTAGE OF UTERUS    . GANGLION CYST EXCISION    . MASS EXCISION Left 03/23/2017   Procedure: EXCISION OF LEFT CHEST WALL MASS;  Surgeon: Fanny Skates, MD;  Location: [facility] ORS;  Service: General;  Laterality: Left;    Family Psychiatric History: I have reviewed family psychiatric history from my progress note on 12/26/2017  Family History:  Family History  Problem Relation Age of Onset  . Alcohol abuse Mother   . Cervical cancer Mother   . Hodgkin's lymphoma Sister   . ADD / ADHD Son   . Drug abuse Son     Social History: Reviewed social history from my progress note on 12/26/2017. Social History   Socioeconomic History  . Marital status: Married    Spouse name: vincent  . Number of children: 2  . Years of education: Not on file  . Highest education level: High school graduate  Occupational History  . Not on file  Social Needs  . Financial resource strain: Not hard at all  . Food insecurity:    Worry: Never true    Inability: Never true  . Transportation needs:    Medical: No    Non-medical: No  Tobacco Use  .  Smoking status: Current Every Day Smoker    Packs/day: 0.25    Years: 30.00    Pack years: 7.50    Types: Cigarettes  . Smokeless tobacco: Never Used  Substance and Sexual Activity  . Alcohol use: Yes    Comment: occasional  . Drug use: No  . Sexual activity: Yes    Birth control/protection: Surgical  Lifestyle  . Physical activity:    Days per week: 0 days    Minutes per session: 0 min  . Stress: Very much  Relationships  . Social connections:    Talks on phone: Not on file    Gets together: Not on file    Attends religious service: Never    Active member of club or organization: No    Attends meetings of clubs or organizations:  Never    Relationship status: Married  Other Topics Concern  . Not on file  Social History Narrative  . Not on file    Allergies:  Allergies  Allergen Reactions  . Duloxetine     Raises BP    Metabolic Disorder Labs: No results found for: HGBA1C, MPG No results found for: PROLACTIN Lab Results  Component Value Date   CHOL 263 (H) 10/29/2017   TRIG (H) 10/29/2017    628.0 Triglyceride is over 400; calculations on Lipids are invalid.   HDL 44.40 10/29/2017   CHOLHDL 6 10/29/2017   No results found for: TSH  Therapeutic Level Labs: No results found for: LITHIUM No results found for: VALPROATE No components found for:  CBMZ  Current Medications: Current Outpatient Medications  Medication Sig Dispense Refill  . albuterol (PROVENTIL HFA;VENTOLIN HFA) 108 (90 Base) MCG/ACT inhaler Inhale 2 puffs into the lungs every 6 (six) hours as needed for wheezing or shortness of breath. 1 Inhaler 1  . clotrimazole-betamethasone (LOTRISONE) cream Apply 1 application topically 2 (two) times daily. For up to 2 weeks 45 g 0  . hydrOXYzine (VISTARIL) 25 MG capsule Take 1 capsule (25 mg total) by mouth 3 (three) times daily as needed. For severe anxiety only 90 capsule 1  . ibuprofen (ADVIL,MOTRIN) 800 MG tablet TAKE 1 TABLET BY MOUTH TWICE A DAY WITH FOOD AS NEEDED FOR MODERATE PAIN 60 tablet 3  . lidocaine (LIDODERM) 5 % Place 1 patch onto the skin daily. Remove & Discard patch within 12 hours or as directed by MD 30 patch 2  . Multiple Vitamin (MULTIVITAMIN WITH MINERALS) TABS tablet Take 1 tablet by mouth daily.    . Multiple Vitamins-Minerals (HAIR SKIN AND NAILS FORMULA) TABS Take 1 tablet by mouth daily.    Marland Kitchen oxyCODONE-acetaminophen (PERCOCET) 10-325 MG tablet Take 1 tablet by mouth every 8 (eight) hours as needed for pain. 90 tablet 0  . oxyCODONE-acetaminophen (PERCOCET) 10-325 MG tablet Take 1 tablet by mouth 3 (three) times daily as needed for pain. 90 tablet 0  . traZODone (DESYREL)  100 MG tablet Take 1.5 tablets (150 mg total) by mouth at bedtime. 135 tablet 0  . venlafaxine XR (EFFEXOR-XR) 37.5 MG 24 hr capsule Take 2 capsules (75 mg total) by mouth daily with breakfast. PT HAS SUPPLIES 90 capsule 0   No current facility-administered medications for this visit.      Musculoskeletal: Strength & Muscle Tone: within normal limits Gait & Station: normal Patient leans: N/A  Psychiatric Specialty Exam: Review of Systems  Psychiatric/Behavioral: The patient is nervous/anxious and has insomnia.   All other systems reviewed and are negative.   Blood pressure Marland Kitchen)  174/97, pulse 92, temperature 99.3 F (37.4 C), temperature source Oral, weight 127 lb 3.2 oz (57.7 kg), last menstrual period 02/12/2011.Body mass index is 25.69 kg/m.  General Appearance: Casual  Eye Contact:  Fair  Speech:  Normal Rate  Volume:  Normal  Mood:  Anxious  Affect:  Congruent  Thought Process:  Goal Directed and Descriptions of Associations: Intact  Orientation:  Full (Time, Place, and Person)  Thought Content: Logical   Suicidal Thoughts:  No  Homicidal Thoughts:  No  Memory:  Immediate;   Fair Recent;   Fair Remote;   Fair  Judgement:  Fair  Insight:  Fair  Psychomotor Activity:  Normal  Concentration:  Concentration: Fair and Attention Span: Fair  Recall:  [employer] of Knowledge: Fair  Language: Fair  Akathisia:  No  Handed:  Right  AIMS (if indicated): denies tremors, rigidity,stiffness  Assets:  Communication Skills Desire for Improvement Social Support  ADL's:  Intact  Cognition: WNL  Sleep:  Poor   Screenings: GAD-7     Office Visit from 11/09/2017 in [place] at [facility]  Total GAD-7 Score  16    PHQ2-9     Clinical Support from 11/27/2017 in [place] Office Visit from 10/29/2017 in [place] at [place] from 08/28/2017 in [place] Clinical Support from 05/29/2017 in [place] Office Visit from 03/13/2017 in [place]  PHQ-2 Total Score  0  0  0  0  0       Assessment and Plan: Tammy Lang is a 51 year old Hispanic female who has a history of depression, anxiety, insomnia, hyperlipidemia, presented to the clinic today for a follow-up visit.  She is biologically predisposed given her family history.  She also has psychosocial stressors of her son having drug abuse and mental health issues as well as her own chronic pain.  Patient also has a history of trauma.  Patient continues to have sleep problems.  Patient however had bronchitis and was on prednisone recently as well as relocated and hence wants to give her medications more time.  She will start psychotherapy session soon.  Plan MDD Effexor extended release 75 mg p.o. daily with breakfast Referred for CBT  GAD Effexor XR 75 mg p.o. daily Hydroxyzine 25 mg p.o. 3 times daily PRN Referred for CBT  For insomnia Continue trazodone 150 to 200 mg p.o. nightly Also advised to take hydroxyzine 25 to 50 mg at bedtime as needed.  Follow-up in clinic in 2 weeks or sooner if needed.  More than 50 % of the time was spent for psychoeducation and supportive psychotherapy and care coordination.  This note was generated in part or whole with voice recognition software. Voice recognition is usually quite accurate but there are transcription errors that can and very often do occur. I apologize for any typographical errors that were not detected and corrected.         Ursula Alert, MD 01/07/2018, 5:17 PM

## 2018-01-08 ENCOUNTER — Telehealth: Payer: Self-pay

## 2018-01-08 NOTE — Telephone Encounter (Signed)
Mr Esquibel [facility] signed) said pt requested refill pantoprazole from [facility]. I could not find pantoprazole on current or hx med list. I spoke with Mallie Mussel at [facility] and he could not find pantoprazole in their system either. Mr Russon will ask pt to look at bottle and call refill request to pharmacy. Nothing further needed at this time.

## 2018-01-21 ENCOUNTER — Ambulatory Visit: Payer: 59 | Admitting: Psychiatry

## 2018-01-22 ENCOUNTER — Ambulatory Visit: Payer: 59 | Admitting: Psychiatry

## 2018-01-22 ENCOUNTER — Encounter: Payer: Self-pay | Admitting: Psychiatry

## 2018-01-22 ENCOUNTER — Other Ambulatory Visit: Payer: Self-pay

## 2018-01-22 ENCOUNTER — Other Ambulatory Visit: Payer: 59

## 2018-01-22 VITALS — BP 137/86 | HR 76 | Temp 98.7°F | Wt 127.4 lb

## 2018-01-22 DIAGNOSIS — F330 Major depressive disorder, recurrent, mild: Secondary | ICD-10-CM

## 2018-01-22 DIAGNOSIS — F411 Generalized anxiety disorder: Secondary | ICD-10-CM

## 2018-01-22 DIAGNOSIS — F17200 Nicotine dependence, unspecified, uncomplicated: Secondary | ICD-10-CM | POA: Diagnosis not present

## 2018-01-22 DIAGNOSIS — F5105 Insomnia due to other mental disorder: Secondary | ICD-10-CM | POA: Diagnosis not present

## 2018-01-22 NOTE — Patient Instructions (Signed)
Coping with Quitting Smoking  Quitting smoking is a physical and mental challenge. You will face cravings, withdrawal symptoms, and temptation. Before quitting, work with your health care provider to make a plan that can help you cope. Preparation can help you quit and keep you from giving in. How can I cope with cravings? Cravings usually last for 5-10 minutes. If you get through it, the craving will pass. Consider taking the following actions to help you cope with cravings:  Keep your mouth busy: ? Chew sugar-free gum. ? Suck on hard candies or a straw. ? Brush your teeth.  Keep your hands and body busy: ? Immediately change to a different activity when you feel a craving. ? Squeeze or play with a ball. ? Do an activity or a hobby, like making bead jewelry, practicing needlepoint, or working with wood. ? Mix up your normal routine. ? Take a short exercise break. Go for a quick walk or run up and down stairs. ? Spend time in public places where smoking is not allowed.  Focus on doing something kind or helpful for someone else.  Call a friend or family member to talk during a craving.  Join a support group.  Call a quit line, such as 1-800-QUIT-NOW.  Talk with your health care provider about medicines that might help you cope with cravings and make quitting easier for you. How can I deal with withdrawal symptoms? Your body may experience negative effects as it tries to get used to not having nicotine in the system. These effects are called withdrawal symptoms. They may include:  Feeling hungrier than normal.  Trouble concentrating.  Irritability.  Trouble sleeping.  Feeling depressed.  Restlessness and agitation.  Craving a cigarette. To manage withdrawal symptoms:  Avoid places, people, and activities that trigger your cravings.  Remember why you want to quit.  Get plenty of sleep.  Avoid coffee and other caffeinated drinks. These may worsen some of your symptoms.  How can I handle social situations? Social situations can be difficult when you are quitting smoking, especially in the first few weeks. To manage this, you can:  Avoid parties, bars, and other social situations where people might be smoking.  Avoid alcohol.  Leave right away if you have the urge to smoke.  Explain to your family and friends that you are quitting smoking. Ask for understanding and support.  Plan activities with friends or family where smoking is not an option. What are some ways I can cope with stress? Wanting to smoke may cause stress, and stress can make you want to smoke. Find ways to manage your stress. Relaxation techniques can help. For example:  Breathe slowly and deeply, in through your nose and out through your mouth.  Listen to soothing, relaxing music.  Talk with a family member or friend about your stress.  Light a candle.  Soak in a bath or take a shower.  Think about a peaceful place. What are some ways I can prevent weight gain? Be aware that many people gain weight after they quit smoking. However, not everyone does. To keep from gaining weight, have a plan in place before you quit and stick to the plan after you quit. Your plan should include:  Having healthy snacks. When you have a craving, it may help to: ? Eat plain popcorn, crunchy carrots, celery, or other cut vegetables. ? Chew sugar-free gum.  Changing how you eat: ? Eat small portion sizes at meals. ? Eat 4-6 small meals   throughout the day instead of 1-2 large meals a day. ? Be mindful when you eat. Do not watch television or do other things that might distract you as you eat.  Exercising regularly: ? Make time to exercise each day. If you do not have time for a long workout, do short bouts of exercise for 5-10 minutes several times a day. ? Do some form of strengthening exercise, like weight lifting, and some form of aerobic exercise, like running or swimming.  Drinking plenty of  water or other low-calorie or no-calorie drinks. Drink 6-8 glasses of water daily, or as much as instructed by your health care provider. Summary  Quitting smoking is a physical and mental challenge. You will face cravings, withdrawal symptoms, and temptation to smoke again. Preparation can help you as you go through these challenges.  You can cope with cravings by keeping your mouth busy (such as by chewing gum), keeping your body and hands busy, and making calls to family, friends, or a helpline for people who want to quit smoking.  You can cope with withdrawal symptoms by avoiding places where people smoke, avoiding drinks with caffeine, and getting plenty of rest.  Ask your health care provider about the different ways to prevent weight gain, avoid stress, and handle social situations. This information is not intended to replace advice given to you by your health care provider. Make sure you discuss any questions you have with your health care provider. Document Released: 12/24/2015 Document Revised: 12/24/2015 Document Reviewed: 12/24/2015 Elsevier Interactive Patient Education  2019 [employer]  

## 2018-01-22 NOTE — Progress Notes (Signed)
[facility] MD OP Progress Note  01/22/2018 5:40 PM Tammy Lang  MRN:  [id number]  Chief Complaint: I am here for follow-up Chief Complaint    Follow-up; Medication Refill     HPI: Tammy Lang is a 53 year old Hispanic female, married, employed, lives in [place], has a history of depression, anxiety, chronic pain, hyperlipidemia, presented to the clinic today for a follow-up visit.  Patient today reports she is making progress on the current medication regimen.  She reports her anxiety symptoms are more so under control on the Effexor.  She is compliant on the Effexor.  She denies any side effects.  She takes it daily in the morning.  She reports sleep is improved.  She continues to take trazodone and hydroxyzine at bedtime.  Patient continues to smoke cigarettes.  She reports she has been trying to cut down however has not been successful.  Some time was spent providing her counseling.  Also discussed nicotine replacement.  If she fails that discussed starting her on Chantix.  Patient continues to struggle with her son's substance abuse problems.  She reports he is currently in rehab.  She is going for a family session to help him.  She looks forward to the same.  She reports work is going well.  She denies any suicidality, perceptual disturbances or homicidality.    Visit Diagnosis:    ICD-10-CM   1. GAD (generalized anxiety disorder) F41.1   2. MDD (major depressive disorder), recurrent episode, mild ([place]) F33.0   3. Insomnia due to mental condition F51.05   4. Tobacco use disorder F17.200     Past Psychiatric History: I have reviewed past psychiatric history from my progress note on 12/26/2017.  Past trials of Wellbutrin, Cymbalta for pain, venlafaxine, hydroxyzine, trazodone, Xanax.     Past Medical History:  Past Medical History:  Diagnosis Date  . Allergy   . Anxiety   . Arthritis   . Asthma   . Lipoma of chest wall 03/23/2017  . Spinal stenosis of cervical region      Past Surgical History:  Procedure Laterality Date  . BREAST SURGERY Right    benign tumor  . BUNIONECTOMY    . CESAREAN SECTION    . DILATION AND CURETTAGE OF UTERUS    . GANGLION CYST EXCISION    . MASS EXCISION Left 03/23/2017   Procedure: EXCISION OF LEFT CHEST WALL MASS;  Surgeon: Fanny Skates, MD;  Location: [facility] ORS;  Service: General;  Laterality: Left;    Family Psychiatric History: I have reviewed family psychiatric history from my progress note on 12/26/2017.  Family History:  Family History  Problem Relation Age of Onset  . Alcohol abuse Mother   . Cervical cancer Mother   . Hodgkin's lymphoma Sister   . ADD / ADHD Son   . Drug abuse Son     Social History: Reviewed social history from my progress note on 12/26/2017. Social History   Socioeconomic History  . Marital status: Married    Spouse name: vincent  . Number of children: 2  . Years of education: Not on file  . Highest education level: High school graduate  Occupational History  . Not on file  Social Needs  . Financial resource strain: Not hard at all  . Food insecurity:    Worry: Never true    Inability: Never true  . Transportation needs:    Medical: No    Non-medical: No  Tobacco Use  . Smoking status: Current Every Day Smoker  Packs/day: 0.25    Years: 30.00    Pack years: 7.50    Types: Cigarettes  . Smokeless tobacco: Never Used  Substance and Sexual Activity  . Alcohol use: Yes    Comment: occasional  . Drug use: No  . Sexual activity: Yes    Birth control/protection: Surgical  Lifestyle  . Physical activity:    Days per week: 0 days    Minutes per session: 0 min  . Stress: Very much  Relationships  . Social connections:    Talks on phone: Not on file    Gets together: Not on file    Attends religious service: Never    Active member of club or organization: No    Attends meetings of clubs or organizations: Never    Relationship status: Married  Other Topics Concern  .  Not on file  Social History Narrative  . Not on file    Allergies:  Allergies  Allergen Reactions  . Duloxetine     Raises BP    Metabolic Disorder Labs: No results found for: HGBA1C, MPG No results found for: PROLACTIN Lab Results  Component Value Date   CHOL 263 (H) 10/29/2017   TRIG (H) 10/29/2017    628.0 Triglyceride is over 400; calculations on Lipids are invalid.   HDL 44.40 10/29/2017   CHOLHDL 6 10/29/2017   No results found for: TSH  Therapeutic Level Labs: No results found for: LITHIUM No results found for: VALPROATE No components found for:  CBMZ  Current Medications: Current Outpatient Medications  Medication Sig Dispense Refill  . albuterol (PROVENTIL HFA;VENTOLIN HFA) 108 (90 Base) MCG/ACT inhaler Inhale 2 puffs into the lungs every 6 (six) hours as needed for wheezing or shortness of breath. 1 Inhaler 1  . clotrimazole-betamethasone (LOTRISONE) cream Apply 1 application topically 2 (two) times daily. For up to 2 weeks 45 g 0  . hydrOXYzine (VISTARIL) 25 MG capsule Take 1 capsule (25 mg total) by mouth 3 (three) times daily as needed. For severe anxiety only 90 capsule 1  . ibuprofen (ADVIL,MOTRIN) 800 MG tablet TAKE 1 TABLET BY MOUTH TWICE A DAY WITH FOOD AS NEEDED FOR MODERATE PAIN 60 tablet 3  . lidocaine (LIDODERM) 5 % Place 1 patch onto the skin daily. Remove & Discard patch within 12 hours or as directed by MD 30 patch 2  . Multiple Vitamin (MULTIVITAMIN WITH MINERALS) TABS tablet Take 1 tablet by mouth daily.    . Multiple Vitamins-Minerals (HAIR SKIN AND NAILS FORMULA) TABS Take 1 tablet by mouth daily.    Marland Kitchen oxyCODONE-acetaminophen (PERCOCET) 10-325 MG tablet Take 1 tablet by mouth 3 (three) times daily as needed for pain. 90 tablet 0  . traZODone (DESYREL) 100 MG tablet Take 1.5 tablets (150 mg total) by mouth at bedtime. 135 tablet 0  . venlafaxine XR (EFFEXOR-XR) 37.5 MG 24 hr capsule Take 2 capsules (75 mg total) by mouth daily with breakfast. PT  HAS SUPPLIES 90 capsule 0   No current facility-administered medications for this visit.      Musculoskeletal: Strength & Muscle Tone: within normal limits Gait & Station: normal Patient leans: N/A  Psychiatric Specialty Exam: Review of Systems  Psychiatric/Behavioral: The patient is nervous/anxious.   All other systems reviewed and are negative.   Blood pressure 137/86, pulse 76, temperature 98.7 F (37.1 C), temperature source Oral, weight 127 lb 6.4 oz (57.8 kg), last menstrual period 02/12/2011.Body mass index is 25.73 kg/m.  General Appearance: Casual  Eye Contact:  Fair  Speech:  Normal Rate  Volume:  Normal  Mood:  Anxious  Affect:  Congruent  Thought Process:  Goal Directed and Descriptions of Associations: Intact  Orientation:  Full (Time, Place, and Person)  Thought Content: Logical   Suicidal Thoughts:  No  Homicidal Thoughts:  No  Memory:  Immediate;   Fair Recent;   Fair Remote;   Fair  Judgement:  Fair  Insight:  Fair  Psychomotor Activity:  Normal  Concentration:  Concentration: Fair and Attention Span: Fair  Recall:  [employer] of Knowledge: Fair  Language: Fair  Akathisia:  No  Handed:  Right  AIMS (if indicated): denies tremors,rigidity  Assets:  Communication Skills Desire for Improvement Social Support  ADL's:  Intact  Cognition: WNL  Sleep:  Fair   Screenings: GAD-7     Office Visit from 11/09/2017 in [place] at [facility]  Total GAD-7 Score  16    PHQ2-9     Clinical Support from 11/27/2017 in [facility] [place] Office Visit from 10/29/2017 in [place] at [place] [place] from 08/28/2017 in [facility] Clinical Support from 05/29/2017 in [facility] Office Visit from 03/13/2017 in [facility]  PHQ-2 Total Score  0  0  0  0  0        Assessment and Plan: Cambrie is a 53 year old Hispanic female who has a history of depression, anxiety, insomnia, hyperlipidemia, presented to the clinic today for a follow-up visit.  She is biologically predisposed given her family history.  She also has psychosocial stressors of her son having drug abuse and mental health problems.  She also has a history of trauma.  Patient is currently making progress and reports improvement with her sleep.  She continues to struggle with smoking.  Continue plan as noted below.  Plan MDD-improving Effexor XR 75 mg p.o. daily with breakfast Continue CBT.  GAD-improving Effexor XR 75 mg p.o. daily Hydroxyzine 25 mg p.o. 3 times daily as needed  For insomnia-improving Trazodone 150 to 200 mg p.o. nightly Hydroxyzine 25 to 50 mg at bedtime as needed  For tobacco use disorder-unstable Provided smoking cessation counseling. Start nicotine patches as needed. Will provide handouts for smoking cessation information.  Follow-up in clinic in 4 to 6 weeks or sooner if needed.  I have spent atleast 15 minutes face to face with patient today. More than 50 % of the time was spent for psychoeducation and supportive psychotherapy and care coordination.  This note was generated in part or whole with voice recognition software. Voice recognition is usually quite accurate but there are transcription errors that can and very often do occur. I apologize for any typographical errors that were not detected and corrected.        Ursula Alert, MD 01/22/2018, 5:40 PM

## 2018-01-24 ENCOUNTER — Encounter: Payer: Self-pay | Admitting: Student in an Organized Health Care Education/Training Program

## 2018-01-24 ENCOUNTER — Ambulatory Visit
Payer: 59 | Attending: Student in an Organized Health Care Education/Training Program | Admitting: Student in an Organized Health Care Education/Training Program

## 2018-01-24 ENCOUNTER — Other Ambulatory Visit: Payer: Self-pay

## 2018-01-24 ENCOUNTER — Ambulatory Visit: Payer: 59 | Admitting: Licensed Clinical Social Worker

## 2018-01-24 VITALS — BP 130/93 | HR 84 | Temp 98.5°F | Ht 59.0 in | Wt 125.0 lb

## 2018-01-24 DIAGNOSIS — M249 Joint derangement, unspecified: Secondary | ICD-10-CM

## 2018-01-24 DIAGNOSIS — M542 Cervicalgia: Secondary | ICD-10-CM | POA: Diagnosis present

## 2018-01-24 DIAGNOSIS — F331 Major depressive disorder, recurrent, moderate: Secondary | ICD-10-CM | POA: Insufficient documentation

## 2018-01-24 DIAGNOSIS — M7061 Trochanteric bursitis, right hip: Secondary | ICD-10-CM | POA: Diagnosis present

## 2018-01-24 DIAGNOSIS — M47812 Spondylosis without myelopathy or radiculopathy, cervical region: Secondary | ICD-10-CM | POA: Diagnosis present

## 2018-01-24 DIAGNOSIS — G894 Chronic pain syndrome: Secondary | ICD-10-CM

## 2018-01-24 DIAGNOSIS — M7918 Myalgia, other site: Secondary | ICD-10-CM | POA: Diagnosis present

## 2018-01-24 DIAGNOSIS — F411 Generalized anxiety disorder: Secondary | ICD-10-CM | POA: Diagnosis not present

## 2018-01-24 DIAGNOSIS — F330 Major depressive disorder, recurrent, mild: Secondary | ICD-10-CM

## 2018-01-24 MED ORDER — OXYCODONE-ACETAMINOPHEN 10-325 MG PO TABS: 1.0000 | ORAL_TABLET | Freq: Three times a day (TID) | ORAL | 0 refills | Status: DC | PRN

## 2018-01-24 MED ORDER — LIDOCAINE 5 % EX PTCH: 1.0000 | MEDICATED_PATCH | CUTANEOUS | 2 refills | Status: DC

## 2018-01-24 NOTE — Patient Instructions (Signed)
One prescription for Lidoderm patch and 2 for Percocet was sent to your pharmacy.  ____________________________________________________________________________________________  Preparing for Procedure with Sedation  Instructions: . Oral Intake: Do not eat or drink anything for at least 8 hours prior to your procedure. . Transportation: Public transportation is not allowed. Bring an adult driver. The driver must be physically present in our waiting room before any procedure can be started. Marland Kitchen Physical Assistance: Bring an adult physically capable of assisting you, in the event you need help. This adult should keep you company at home for at least 6 hours after the procedure. . Blood Pressure Medicine: Take your blood pressure medicine with a sip of water the morning of the procedure. . Blood thinners: Notify our staff if you are taking any blood thinners. Depending on which one you take, there will be specific instructions on how and when to stop it. . Diabetics on insulin: Notify the staff so that you can be scheduled 1st case in the morning. If your diabetes requires high dose insulin, take only  of your normal insulin dose the morning of the procedure and notify the staff that you have done so. . Preventing infections: Shower with an antibacterial soap the morning of your procedure. . Build-up your immune system: Take 1000 mg of Vitamin C with every meal (3 times a day) the day prior to your procedure. Marland Kitchen Antibiotics: Inform the staff if you have a condition or reason that requires you to take antibiotics before dental procedures. . Pregnancy: If you are pregnant, call and cancel the procedure. . Sickness: If you have a cold, fever, or any active infections, call and cancel the procedure. . Arrival: You must be in the facility at least 30 minutes prior to your scheduled procedure. . Children: Do not bring children with you. . Dress appropriately: Bring dark clothing that you would not mind if  they get stained. . Valuables: Do not bring any jewelry or valuables.  Procedure appointments are reserved for interventional treatments only. Marland Kitchen No Prescription Refills. . No medication changes will be discussed during procedure appointments. . No disability issues will be discussed.  Reasons to call and reschedule or cancel your procedure: (Following these recommendations will minimize the risk of a serious complication.) . Surgeries: Avoid having procedures within 2 weeks of any surgery. (Avoid for 2 weeks before or after any surgery). . Flu Shots: Avoid having procedures within 2 weeks of a flu shots or . (Avoid for 2 weeks before or after immunizations). . Barium: Avoid having a procedure within 7-10 days after having had a radiological study involving the use of radiological contrast. (Myelograms, Barium swallow or enema study). . Heart attacks: Avoid any elective procedures or surgeries for the initial 6 months after a "Myocardial Infarction" (Heart Attack). . Blood thinners: It is imperative that you stop these medications before procedures. Let us know if you if you take any blood thinner.  . Infection: Avoid procedures during or within two weeks of an infection (including chest colds or gastrointestinal problems). Symptoms associated with infections include: Localized redness, fever, chills, night sweats or profuse sweating, burning sensation when voiding, cough, congestion, stuffiness, runny nose, sore throat, diarrhea, nausea, vomiting, cold or Flu symptoms, recent or current infections. It is specially important if the infection is over the area that we intend to treat. Marland Kitchen Heart and lung problems: Symptoms that may suggest an active cardiopulmonary problem include: cough, chest pain, breathing difficulties or shortness of breath, dizziness, ankle swelling, uncontrolled high or  unusually low blood pressure, and/or palpitations. If you are experiencing any of these symptoms, cancel your  procedure and contact your primary care physician for an evaluation.  Remember:  Regular Business hours are:  Monday to Thursday 8:00 AM to 4:00 PM  Provider's Schedule: Milinda Pointer, MD:  Procedure days: Tuesday and Thursday 7:30 AM to 4:00 PM  Gillis Santa, MD:  Procedure days: Monday and Wednesday 7:30 AM to 4:00 PM ____________________________________________________________________________________________

## 2018-01-24 NOTE — Progress Notes (Signed)
Patient's Name: Tammy Lang  MRN: [id number]  Referring Provider: Jearld Fenton, NP  DOB: [birth year]  PCP: Jearld Fenton, NP  DOS: 01/24/2018  Note by: Gillis Santa, MD  Service setting: Ambulatory outpatient  Specialty: Interventional Pain Management  Location: [facility] (AMB) Pain Management Facility    Patient type: Established   Primary Reason(s) for Visit: Encounter for prescription drug management. (Level of risk: moderate)  CC: Back Pain (lower); Neck Pain; and buttock (right)  HPI  Tammy Lang is a 53 y.o. year old, female patient, who comes today for a medication management evaluation. She has Spondylosis of cervical region without myelopathy or radiculopathy; Chronic pain syndrome; Bilateral carpal tunnel syndrome; Chronic pain of right elbow; Chronic left-sided low back pain with left-sided sciatica; Trochanteric bursitis of right hip; and Derangement of right SI joint on their problem list. Her primarily concern today is the Back Pain (lower); Neck Pain; and buttock (right)  Pain Assessment: Location: Lower Back(New c/o burning pain "8" in right buttock) Radiating: to left hip down side of leg to knee Onset: More than a month ago Duration: Chronic pain Quality: Constant, Aching, Pins and needles Severity: 6 /10 (subjective, self-reported pain score)  Note: Reported level is inconsistent with clinical observations.                         When using our objective Pain Scale, levels between 6 and 10/10 are said to belong in an emergency room, as it progressively worsens from a 6/10, described as severely limiting, requiring emergency care not usually available at an outpatient pain management facility. At a 6/10 level, communication becomes difficult and requires great effort. Assistance to reach the emergency department may be required. Facial flushing and profuse sweating along with potentially dangerous increases in heart rate and blood pressure will be evident. Effect on ADL:  prolonged walking and standing Timing: Constant Modifying factors: meds BP: (!) 130/93  HR: 84  Tammy Lang was last scheduled for an appointment on 12/12/2017 for medication management. During today's appointment we reviewed Tammy Lang's chronic pain status, as well as her outpatient medication regimen.  The patient  reports no history of drug use. Her body mass index is 25.25 kg/m.  Further details on both, my assessment(s), as well as the proposed treatment plan, please see below.  Controlled Substance Pharmacotherapy Assessment REMS (Risk Evaluation and Mitigation Strategy)  Analgesic:Percocet 10 mg 3 times daily as needed, quantity 90, fill date 01-12-2018 MME/day: 44m/day.  WRise Patience RN  01/24/2018  8:16 AM  Signed Nursing Pain Medication Assessment:  Safety precautions to be maintained throughout the outpatient stay will include: orient to surroundings, keep bed in low position, maintain call bell within reach at all times, provide assistance with transfer out of bed and ambulation.  Medication Inspection Compliance: Pill count conducted under aseptic conditions, in front of the patient. Neither the pills nor the bottle was removed from the patient's sight at any time. Once count was completed pills were immediately returned to the patient in their original bottle.  Medication: Oxycodone/APAP Pill/Patch Count: 85 of 90 pills remain Pill/Patch Appearance: Markings consistent with prescribed medication Bottle Appearance: Standard pharmacy container. Clearly labeled. Filled Date: 01 / 04 / 2020 Last Medication intake:  Yesterday   Pharmacokinetics: Liberation and absorption (onset of action): WNL Distribution (time to peak effect): WNL Metabolism and excretion (duration of action): WNL         Pharmacodynamics: Desired effects: Analgesia: [name]  reports >50% benefit. Functional ability: Patient reports that medication allows her to accomplish basic  ADLs Clinically meaningful improvement in function (CMIF): Sustained CMIF goals met Perceived effectiveness: Described as relatively effective, allowing for increase in activities of daily living (ADL) Undesirable effects: Side-effects or Adverse reactions: None reported Monitoring: [place] PMP: Online review of the past 6-monthperiod conducted. Compliant with practice rules and regulations Last UDS on record: Summary  Date Value Ref Range Status  11/27/2017 FINAL  Final    Comment:    ==================================================================== TOXASSURE SELECT 13 (MW) ==================================================================== Test                             Result       Flag       Units Drug Present   Oxycodone                      116                     ng/mg creat   Oxymorphone                    1356                    ng/mg creat   Noroxycodone                   996                     ng/mg creat   Noroxymorphone                 545                     ng/mg creat    Sources of oxycodone are scheduled prescription medications.    Oxymorphone, noroxycodone, and noroxymorphone are expected    metabolites of oxycodone. Oxymorphone is also available as a    scheduled prescription medication. ==================================================================== Test                      Result    Flag   Units      Ref Range   Creatinine              160              mg/dL      >=20 ==================================================================== Declared Medications:  Medication list was not provided. ==================================================================== For clinical consultation, please call ([phone] ====================================================================    UDS interpretation: Compliant          Medication Assessment Form: Reviewed. Patient indicates being compliant with therapy Treatment compliance: Compliant Risk  Assessment Profile: Aberrant behavior: See prior evaluations. None observed or detected today Comorbid factors increasing risk of overdose: See prior notes. No additional risks detected today Opioid risk tool (ORT) (Total Score): 4 Personal History of Substance Abuse (SUD-Substance use disorder):  Alcohol: Negative  Illegal Drugs: Negative  Rx Drugs: Negative  ORT Risk Level calculation: Moderate Risk Risk of substance use disorder (SUD): Low Opioid Risk Tool - 01/24/18 0816      Family History of Substance Abuse   Alcohol  Positive Female    Illegal Drugs  Positive Female    Rx Drugs  Negative      Personal History of Substance Abuse   Alcohol  Negative    Illegal  Drugs  Negative    Rx Drugs  Negative      Age   Age between 89-45 years   No      Psychological Disease   Psychological Disease  Negative    Depression  Positive      Total Score   Opioid Risk Tool Scoring  4    Opioid Risk Interpretation  Moderate Risk      ORT Scoring interpretation table:  Score <3 = Low Risk for SUD  Score between 4-7 = Moderate Risk for SUD  Score >8 = High Risk for Opioid Abuse   Risk Mitigation Strategies:  Patient Counseling: Covered Patient-Prescriber Agreement (PPA): Present and active  Notification to other healthcare providers: Done  Pharmacologic Plan: No change in therapy, at this time.             Laboratory Chemistry  Inflammation Markers (CRP: Acute Phase) (ESR: Chronic Phase) No results found for: CRP, ESRSEDRATE, LATICACIDVEN                       Rheumatology Markers No results found for: RF, ANA, LABURIC, URICUR, LYMEIGGIGMAB, LYMEABIGMQN, HLAB27                      Renal Function Markers Lab Results  Component Value Date   BUN 13 10/29/2017   CREATININE 0.94 10/29/2017                             Hepatic Function Markers Lab Results  Component Value Date   AST 26 10/29/2017   ALT 24 10/29/2017   ALBUMIN 4.3 10/29/2017   ALKPHOS 67 10/29/2017                         Electrolytes Lab Results  Component Value Date   NA 139 10/29/2017   K 4.1 10/29/2017   CL 102 10/29/2017   CALCIUM 9.4 10/29/2017                        Neuropathy Markers No results found for: VITAMINB12, FOLATE, HGBA1C, HIV                      CNS Tests No results found for: COLORCSF, APPEARCSF, RBCCOUNTCSF, WBCCSF, POLYSCSF, LYMPHSCSF, EOSCSF, PROTEINCSF, GLUCCSF, JCVIRUS, CSFOLI, IGGCSF                      Bone Pathology Markers Lab Results  Component Value Date   VD25OH 23.55 (L) 10/29/2017                         Coagulation Parameters Lab Results  Component Value Date   PLT 201.0 10/29/2017                        Cardiovascular Markers Lab Results  Component Value Date   HGB 13.3 10/29/2017   HCT 39.8 10/29/2017                         CA Markers No results found for: CEA, CA125, LABCA2                      Note: Lab results reviewed.  Recent Diagnostic Imaging Results  MR CERVICAL  SPINE WO CONTRAST CLINICAL DATA:  Cervicalgia and radiculitis  EXAM: MRI CERVICAL SPINE WITHOUT CONTRAST  TECHNIQUE: Multiplanar, multisequence MR imaging of the cervical spine was performed. No intravenous contrast was administered.  COMPARISON:  None.  FINDINGS: Alignment: 3 mm retrolisthesis C4-5. Mild retrolisthesis C5-6 and C6-7. 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 Negative for fracture or mass. Normal bone marrow  Cord: Normal signal and morphology  Posterior Fossa, vertebral arteries, paraspinal tissues: Negative  Disc levels:  C2-3: Tiny central disc protrusion.  Negative for stenosis  C3-4: Congenital fusion.  Negative for stenosis  C4-5: 3 mm retrolisthesis. Moderate disc degeneration and diffuse uncinate spurring causing moderate foraminal stenosis right greater than left. Mild spinal stenosis.  C5-6: Mild retrolisthesis. Mild disc degeneration and spurring. Mild foraminal stenosis  bilaterally  C6-7: Mild retrolisthesis. Disc degeneration and bilateral uncinate spurring. Mild foraminal narrowing bilaterally.  C7-T1: Mild anterolisthesis. Negative for stenosis. Bilateral facet degeneration.  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 Mild spinal stenosis and moderate foraminal stenosis right greater than left  Mild foraminal narrowing bilaterally C5-6 and C6-7 due to spurring.  Electronically Signed   By: Franchot Gallo M.D.   On: 03/23/2017 07:22  Complexity Note: Imaging results reviewed. Results shared with Ms. Lobdell, using Layman's terms.                         Meds   Current Outpatient Medications:  .  albuterol (PROVENTIL HFA;VENTOLIN HFA) 108 (90 Base) MCG/ACT inhaler, Inhale 2 puffs into the lungs every 6 (six) hours as needed for wheezing or shortness of breath., Disp: 1 Inhaler, Rfl: 1 .  clotrimazole-betamethasone (LOTRISONE) cream, Apply 1 application topically 2 (two) times daily. For up to 2 weeks, Disp: 45 g, Rfl: 0 .  hydrOXYzine (VISTARIL) 25 MG capsule, Take 1 capsule (25 mg total) by mouth 3 (three) times daily as needed. For severe anxiety only, Disp: 90 capsule, Rfl: 1 .  ibuprofen (ADVIL,MOTRIN) 800 MG tablet, TAKE 1 TABLET BY MOUTH TWICE A DAY WITH FOOD AS NEEDED FOR MODERATE PAIN, Disp: 60 tablet, Rfl: 3 .  lidocaine (LIDODERM) 5 %, Place 1 patch onto the skin daily. Remove & Discard patch within 12 hours or as directed by MD, Disp: 30 patch, Rfl: 2 .  Multiple Vitamin (MULTIVITAMIN WITH MINERALS) TABS tablet, Take 1 tablet by mouth daily., Disp: , Rfl:  .  Multiple Vitamins-Minerals (HAIR SKIN AND NAILS FORMULA) TABS, Take 1 tablet by mouth daily., Disp: , Rfl:  .  [START ON 02/11/2018] oxyCODONE-acetaminophen (PERCOCET) 10-325 MG tablet, Take 1 tablet by mouth 3 (three) times daily as needed for up to 30 days for pain., Disp: 90 tablet, Rfl: 0 .  traZODone (DESYREL) 100 MG tablet, Take 1.5  tablets (150 mg total) by mouth at bedtime., Disp: 135 tablet, Rfl: 0 .  venlafaxine XR (EFFEXOR-XR) 37.5 MG 24 hr capsule, Take 2 capsules (75 mg total) by mouth daily with breakfast. PT HAS SUPPLIES, Disp: 90 capsule, Rfl: 0 .  [START ON 03/11/2018] oxyCODONE-acetaminophen (PERCOCET) 10-325 MG tablet, Take 1 tablet by mouth every 8 (eight) hours as needed for up to 30 days for pain., Disp: 90 tablet, Rfl: 0  ROS  Constitutional: Denies any fever or chills Gastrointestinal: No reported hemesis, hematochezia, vomiting, or acute GI distress Musculoskeletal: Denies any acute onset joint swelling, redness, loss of ROM, or weakness Neurological: No reported episodes of acute onset apraxia, aphasia, dysarthria, agnosia, amnesia,  paralysis, loss of coordination, or loss of consciousness  Allergies  Ms. Dixson is allergic to duloxetine.  PFSH  Drug: Ms. Lantis  reports no history of drug use. Alcohol:  reports current alcohol use. Tobacco:  reports that she has been smoking cigarettes. She has a 7.50 pack-year smoking history. She has never used smokeless tobacco. Medical:  has a past medical history of Allergy, Anxiety, Arthritis, Asthma, Lipoma of chest wall (03/23/2017), and Spinal stenosis of cervical region. Surgical: Ms. Dulak  has a past surgical history that includes Cesarean section; Bunionectomy; Ganglion cyst excision; Breast surgery (Right); Dilation and curettage of uterus; and Mass excision (Left, 03/23/2017). Family: family history includes ADD / ADHD in her son; Alcohol abuse in her mother; Cervical cancer in her mother; Drug abuse in her son; Hodgkin's lymphoma in her sister.  Constitutional Exam  General appearance: Well nourished, well developed, and well hydrated. In no apparent acute distress Vitals:   01/24/18 0802  BP: (!) 130/93  Pulse: 84  Temp: 98.5 F (36.9 C)  TempSrc: Oral  SpO2: 98%  Weight: 125 lb (56.7 kg)  Height: 4' 11"  (1.499 m)   BMI Assessment:  Estimated body mass index is 25.25 kg/m as calculated from the following:   Height as of this encounter: 4' 11"  (1.499 m).   Weight as of this encounter: 125 lb (56.7 kg).  BMI interpretation table: BMI level Category Range association with higher incidence of chronic pain  <18 kg/m2 Underweight   18.5-24.9 kg/m2 Ideal body weight   25-29.9 kg/m2 Overweight Increased incidence by 20%  30-34.9 kg/m2 Obese (Class I) Increased incidence by 68%  35-39.9 kg/m2 Severe obesity (Class II) Increased incidence by 136%  >40 kg/m2 Extreme obesity (Class III) Increased incidence by 254%   Patient's current BMI Ideal Body weight  Body mass index is 25.25 kg/m. Patient must be at least 60 in tall to calculate ideal body weight   BMI Readings from Last 4 Encounters:  01/24/18 25.25 kg/m  01/22/18 25.73 kg/m  01/07/18 25.69 kg/m  12/26/17 25.81 kg/m   Wt Readings from Last 4 Encounters:  01/24/18 125 lb (56.7 kg)  01/22/18 127 lb 6.4 oz (57.8 kg)  01/07/18 127 lb 3.2 oz (57.7 kg)  12/26/17 127 lb 12.8 oz (58 kg)  Psych/Mental status: Alert, oriented x 3 (person, place, & time)       Eyes: PERLA Respiratory: No evidence of acute respiratory distress  Cervical Spine Area Exam  Skin & Axial Inspection: No masses, redness, edema, swelling, or associated skin lesions Alignment: Symmetrical Functional ROM: Decreased ROM, to the right Stability: No instability detected Muscle Tone/Strength: Functionally intact. No obvious neuro-muscular anomalies detected. Sensory (Neurological): Dermatomal pain pattern RIGHT  C7 Palpation: No palpable anomalies             Positive Spurling's on the right Upper Extremity (UE) Exam    Side: Right upper extremity  Side: Left upper extremity  Skin & Extremity Inspection: Skin color, temperature, and hair growth are WNL. No peripheral edema or cyanosis. No masses, redness, swelling, asymmetry, or associated skin lesions. No contractures.  Skin & Extremity  Inspection: Skin color, temperature, and hair growth are WNL. No peripheral edema or cyanosis. No masses, redness, swelling, asymmetry, or associated skin lesions. No contractures.  Functional ROM: Restricted ROM          Functional ROM: Unrestricted ROM          Muscle Tone/Strength: Functionally intact. No obvious neuro-muscular anomalies detected.  Muscle  Tone/Strength: Functionally intact. No obvious neuro-muscular anomalies detected.  Sensory (Neurological): Dermatomal pain pattern          Sensory (Neurological): Unimpaired          Palpation: No palpable anomalies              Palpation: No palpable anomalies              Provocative Test(s):  Phalen's test: deferred Tinel's test: deferred Apley's scratch test (touch opposite shoulder):  Action 1 (Across chest): Decreased ROM Action 2 (Overhead): Decreased ROM Action 3 (LB reach): Decreased ROM   Provocative Test(s):  Phalen's test: deferred Tinel's test: deferred Apley's scratch test (touch opposite shoulder):  Action 1 (Across chest): deferred Action 2 (Overhead): deferred Action 3 (LB reach): deferred    Thoracic Spine Area Exam  Skin & Axial Inspection: No masses, redness, or swelling Alignment: Symmetrical Functional ROM: Unrestricted ROM Stability: No instability detected Muscle Tone/Strength: Functionally intact. No obvious neuro-muscular anomalies detected. Sensory (Neurological): Unimpaired Muscle strength & Tone: No palpable anomalies  Lumbar Spine Area Exam  Skin & Axial Inspection: No masses, redness, or swelling Alignment: Symmetrical Functional ROM: Decreased ROM affecting primarily the right Stability: No instability detected Muscle Tone/Strength: Functionally intact. No obvious neuro-muscular anomalies detected. Sensory (Neurological): Unimpaired Palpation: No palpable anomalies       Provocative Tests: Hyperextension/rotation test: (+) due to pain.right Lumbar quadrant test (Kemp's test): deferred today        Lateral bending test: deferred today       Patrick's Maneuver: (+) for right-sided S-I arthralgia             FABER* test: (+) due to pain             S-I anterior distraction/compression test: deferred today         S-I lateral compression test: deferred today         S-I Thigh-thrust test: deferred today         S-I Gaenslen's test: deferred today         *(Flexion, ABduction and External Rotation)  Gait & Posture Assessment  Ambulation: Unassisted Gait: Relatively normal for age and body habitus Posture: WNL   Lower Extremity Exam    Side: Right lower extremity  Side: Left lower extremity  Stability: No instability observed          Stability: No instability observed          Skin & Extremity Inspection: Skin color, temperature, and hair growth are WNL. No peripheral edema or cyanosis. No masses, redness, swelling, asymmetry, or associated skin lesions. No contractures.  Skin & Extremity Inspection: Skin color, temperature, and hair growth are WNL. No peripheral edema or cyanosis. No masses, redness, swelling, asymmetry, or associated skin lesions. No contractures.  Functional ROM: Restricted ROM for hip joint          Functional ROM: Unrestricted ROM                  Muscle Tone/Strength: Functionally intact. No obvious neuro-muscular anomalies detected.  Muscle Tone/Strength: Functionally intact. No obvious neuro-muscular anomalies detected.  Sensory (Neurological): Musculoskeletal pain pattern        Sensory (Neurological): Unimpaired        DTR: Patellar: deferred today Achilles: deferred today Plantar: deferred today  DTR: Patellar: deferred today Achilles: deferred today Plantar: deferred today  Palpation: No palpable anomalies  Palpation: No palpable anomalies   Assessment  Primary Diagnosis & Pertinent Problem List:  The primary encounter diagnosis was Chronic pain syndrome. Diagnoses of Trochanteric bursitis of right hip, Derangement of right SI joint, Cervicalgia,  Spondylosis of cervical region without myelopathy or radiculopathy, Moderate episode of recurrent major depressive disorder ([place]), and Myofascial pain were also pertinent to this visit.  Status Diagnosis  Controlled Having a Flare-up Having a Flare-up 1. Chronic pain syndrome   2. Trochanteric bursitis of right hip   3. Derangement of right SI joint   4. Cervicalgia   5. Spondylosis of cervical region without myelopathy or radiculopathy   6. Moderate episode of recurrent major depressive disorder ([place])   7. Myofascial pain     Problems updated and reviewed during this visit: Problem  Trochanteric Bursitis of Right Hip  Derangement of Right Si Joint   Patient follows up today for medication management she is also endorsing new onset right-sided hip pain.  It is overlying her greater trochanteric bursa.  Patient does not endorse any inciting event such as fall or sleeping position that could explain her symptoms.  She is exquisitely tender to palpation overlying her right GTB.  Patient also has positive Patrick's on maneuver.  Discussed right SI joint injection and right greater trochanteric bursa injection.  Patient is also seeing Dr. Shea Evans with psychiatry, she is grateful for her care and states that after being placed on Effexor her mood is improved she is handling her social situation better and she is sleeping better as well.  We will also refill patient's Percocet and lidocaine patches as below.  UDS up-to-date and appropriate.  PMP checked and appropriate.  Plan: -Med refill as below -Right SI joint injection -Right greater trochanteric bursa injection  Future considerations: Right cervical ESI for right cervical radicular symptoms.  Not having a flareup at the moment.  Plan of Care  Pharmacotherapy (Medications Ordered): Meds ordered this encounter  Medications  . oxyCODONE-acetaminophen (PERCOCET) 10-325 MG tablet    Sig: Take 1 tablet by mouth 3 (three) times daily as  needed for up to 30 days for pain.    Dispense:  90 tablet    Refill:  0    Do not place this medication, or any other prescription from our practice, on "Automatic Refill". Patient may have prescription filled one day early if pharmacy is closed on scheduled refill date.  Marland Kitchen oxyCODONE-acetaminophen (PERCOCET) 10-325 MG tablet    Sig: Take 1 tablet by mouth every 8 (eight) hours as needed for up to 30 days for pain.    Dispense:  90 tablet    Refill:  0    Do not place this medication, or any other prescription from our practice, on "Automatic Refill". Patient may have prescription filled one day early if pharmacy is closed on scheduled refill date.  . lidocaine (LIDODERM) 5 %    Sig: Place 1 patch onto the skin daily. Remove & Discard patch within 12 hours or as directed by MD    Dispense:  30 patch    Refill:  2   Lab-work, procedure(s), and/or referral(s): Orders Placed This Encounter  Procedures  . SACROILIAC JOINT INJECTION  . Large Joint Injection/Arthrocentesis    Provider-requested follow-up: Return in about 1 week (around 01/31/2018) for Procedure.  Future Appointments  Date Time Provider [place]  01/24/2018  4:00 PM [facility] [place], [place] [facility] None  01/25/2018  3:45 PM [facility]  03/05/2018  3:00 PM Ursula Alert, [place] [facility] None    Primary Care Physician: Jearld Fenton, NP  Location: [facility] Note by: Gillis Santa, M.D Date: 01/24/2018; Time: 8:42 AM  Patient Instructions  One prescription for Lidoderm patch and 2 for Percocet was sent to your pharmacy.  ____________________________________________________________________________________________  Preparing for Procedure with Sedation  Instructions: . Oral Intake: Do not eat or drink anything for at least 8 hours prior to your procedure. . Transportation: Public transportation is not allowed. Bring an adult driver. The driver must be physically  present in our waiting room before any procedure can be started. Marland Kitchen Physical Assistance: Bring an adult physically capable of assisting you, in the event you need help. This adult should keep you company at home for at least 6 hours after the procedure. . Blood Pressure Medicine: Take your blood pressure medicine with a sip of water the morning of the procedure. . Blood thinners: Notify our staff if you are taking any blood thinners. Depending on which one you take, there will be specific instructions on how and when to stop it. . Diabetics on insulin: Notify the staff so that you can be scheduled 1st case in the morning. If your diabetes requires high dose insulin, take only  of your normal insulin dose the morning of the procedure and notify the staff that you have done so. . Preventing infections: Shower with an antibacterial soap the morning of your procedure. . Build-up your immune system: Take 1000 mg of Vitamin C with every meal (3 times a day) the day prior to your procedure. Marland Kitchen Antibiotics: Inform the staff if you have a condition or reason that requires you to take antibiotics before dental procedures. . Pregnancy: If you are pregnant, call and cancel the procedure. . Sickness: If you have a cold, fever, or any active infections, call and cancel the procedure. . Arrival: You must be in the facility at least 30 minutes prior to your scheduled procedure. . Children: Do not bring children with you. . Dress appropriately: Bring dark clothing that you would not mind if they get stained. . Valuables: Do not bring any jewelry or valuables.  Procedure appointments are reserved for interventional treatments only. Marland Kitchen No Prescription Refills. . No medication changes will be discussed during procedure appointments. . No disability issues will be discussed.  Reasons to call and reschedule or cancel your procedure: (Following these recommendations will minimize the risk of a serious  complication.) . Surgeries: Avoid having procedures within 2 weeks of any surgery. (Avoid for 2 weeks before or after any surgery). . Flu Shots: Avoid having procedures within 2 weeks of a flu shots or . (Avoid for 2 weeks before or after immunizations). . Barium: Avoid having a procedure within 7-10 days after having had a radiological study involving the use of radiological contrast. (Myelograms, Barium swallow or enema study). . Heart attacks: Avoid any elective procedures or surgeries for the initial 6 months after a "Myocardial Infarction" (Heart Attack). . Blood thinners: It is imperative that you stop these medications before procedures. Let us know if you if you take any blood thinner.  . Infection: Avoid procedures during or within two weeks of an infection (including chest colds or gastrointestinal problems). Symptoms associated with infections include: Localized redness, fever, chills, night sweats or profuse sweating, burning sensation when voiding, cough, congestion, stuffiness, runny nose, sore throat, diarrhea, nausea, vomiting, cold or Flu symptoms, recent or current infections. It is specially important if the infection is over the area that we intend to treat. Marland Kitchen Heart and lung problems: Symptoms that may  suggest an active cardiopulmonary problem include: cough, chest pain, breathing difficulties or shortness of breath, dizziness, ankle swelling, uncontrolled high or unusually low blood pressure, and/or palpitations. If you are experiencing any of these symptoms, cancel your procedure and contact your primary care physician for an evaluation.  Remember:  Regular Business hours are:  Monday to Thursday 8:00 AM to 4:00 PM  Provider's Schedule: Milinda Pointer, MD:  Procedure days: Tuesday and Thursday 7:30 AM to 4:00 PM  Gillis Santa, MD:  Procedure days: Monday and Wednesday 7:30 AM to 4:00  PM ____________________________________________________________________________________________

## 2018-01-24 NOTE — Progress Notes (Signed)
Nursing Pain Medication Assessment:  Safety precautions to be maintained throughout the outpatient stay will include: orient to surroundings, keep bed in low position, maintain call bell within reach at all times, provide assistance with transfer out of bed and ambulation.  Medication Inspection Compliance: Pill count conducted under aseptic conditions, in front of the patient. Neither the pills nor the bottle was removed from the patient's sight at any time. Once count was completed pills were immediately returned to the patient in their original bottle.  Medication: Oxycodone/APAP Pill/Patch Count: 85 of 90 pills remain Pill/Patch Appearance: Markings consistent with prescribed medication Bottle Appearance: Standard pharmacy container. Clearly labeled. Filled Date: 01 / 04 / 2020 Last Medication intake:  Yesterday
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
## 2018-01-25 ENCOUNTER — Other Ambulatory Visit (INDEPENDENT_AMBULATORY_CARE_PROVIDER_SITE_OTHER): Payer: 59

## 2018-01-25 DIAGNOSIS — E782 Mixed hyperlipidemia: Secondary | ICD-10-CM

## 2018-01-25 DIAGNOSIS — E559 Vitamin D deficiency, unspecified: Secondary | ICD-10-CM | POA: Diagnosis not present

## 2018-01-25 NOTE — Addendum Note (Signed)
Addended by: Lendon Collar on: 01/25/2018 03:46 PM   Modules accepted: Orders

## 2018-01-26 LAB — COMPREHENSIVE METABOLIC PANEL
AG Ratio: 1.7 (calc) (ref 1.0–2.5)
ALKALINE PHOSPHATASE (APISO): 66 U/L (ref 33–130)
ALT: 24 U/L (ref 6–29)
AST: 22 U/L (ref 10–35)
Albumin: 4.3 g/dL (ref 3.6–5.1)
BUN: 12 mg/dL (ref 7–25)
CO2: 27 mmol/L (ref 20–32)
Calcium: 9.6 mg/dL (ref 8.6–10.4)
Chloride: 103 mmol/L (ref 98–110)
Creat: 0.85 mg/dL (ref 0.50–1.05)
Globulin: 2.5 g/dL (calc) (ref 1.9–3.7)
Glucose, Bld: 107 mg/dL — ABNORMAL HIGH (ref 65–99)
Potassium: 4.3 mmol/L (ref 3.5–5.3)
Sodium: 140 mmol/L (ref 135–146)
Total Bilirubin: 0.3 mg/dL (ref 0.2–1.2)
Total Protein: 6.8 g/dL (ref 6.1–8.1)

## 2018-01-26 LAB — LIPID PANEL
Cholesterol: 252 mg/dL — ABNORMAL HIGH (ref <200)
HDL: 65 mg/dL (ref >50)
LDL Cholesterol (Calc): 149 mg/dL (calc) — ABNORMAL HIGH
Non-HDL Cholesterol (Calc): 187 mg/dL (calc) — ABNORMAL HIGH (ref <130)
Total CHOL/HDL Ratio: 3.9 (calc) (ref <5.0)
Triglycerides: 235 mg/dL — ABNORMAL HIGH (ref <150)

## 2018-01-26 LAB — VITAMIN D 25 HYDROXY (VIT D DEFICIENCY, FRACTURES): Vit D, 25-Hydroxy: 24 ng/mL — ABNORMAL LOW (ref 30–100)

## 2018-01-28 ENCOUNTER — Telehealth: Payer: Self-pay

## 2018-01-28 NOTE — Telephone Encounter (Signed)
I called the patient to schedule her right side SIJI you ordered and she wants to do both sides. I told her I would send you a note regarding this. I scheduled her for Wednesday.

## 2018-01-30 ENCOUNTER — Other Ambulatory Visit: Payer: Self-pay

## 2018-01-30 ENCOUNTER — Ambulatory Visit
Admission: RE | Admit: 2018-01-30 | Discharge: 2018-01-30 | Disposition: A | Discharge status: Home / Self Care | Payer: 59 | Source: Ambulatory Visit | Attending: Student in an Organized Health Care Education/Training Program | Admitting: Student in an Organized Health Care Education/Training Program

## 2018-01-30 ENCOUNTER — Encounter: Payer: Self-pay | Admitting: Student in an Organized Health Care Education/Training Program

## 2018-01-30 ENCOUNTER — Ambulatory Visit (HOSPITAL_BASED_OUTPATIENT_CLINIC_OR_DEPARTMENT_OTHER): Payer: 59 | Admitting: Student in an Organized Health Care Education/Training Program

## 2018-01-30 VITALS — BP 142/74 | HR 90 | Temp 98.2°F | Resp 18 | Ht 59.0 in | Wt 125.0 lb

## 2018-01-30 DIAGNOSIS — M249 Joint derangement, unspecified: Secondary | ICD-10-CM | POA: Insufficient documentation

## 2018-01-30 DIAGNOSIS — G8929 Other chronic pain: Secondary | ICD-10-CM | POA: Diagnosis present

## 2018-01-30 DIAGNOSIS — M25521 Pain in right elbow: Secondary | ICD-10-CM | POA: Insufficient documentation

## 2018-01-30 MED ORDER — DEXAMETHASONE SODIUM PHOSPHATE 10 MG/ML IJ SOLN
10.0000 mg | Freq: Once | INTRAMUSCULAR | Status: AC
  Administered 2018-01-30: 10 mg

## 2018-01-30 MED ORDER — ROPIVACAINE HCL 2 MG/ML IJ SOLN
10.0000 mL | Freq: Once | INTRAMUSCULAR | Status: AC
  Administered 2018-01-30: 10 mL

## 2018-01-30 MED ORDER — FENTANYL CITRATE (PF) 100 MCG/2ML IJ SOLN
25.0000 ug | INTRAMUSCULAR | Status: DC | PRN
  Administered 2018-01-30: 100 ug via INTRAVENOUS

## 2018-01-30 MED ORDER — LIDOCAINE HCL 2 % IJ SOLN
INTRAMUSCULAR | Status: AC
  Filled 2018-01-30: qty 20

## 2018-01-30 MED ORDER — DEXAMETHASONE SODIUM PHOSPHATE 10 MG/ML IJ SOLN
INTRAMUSCULAR | Status: AC
  Filled 2018-01-30: qty 1

## 2018-01-30 MED ORDER — FENTANYL CITRATE (PF) 100 MCG/2ML IJ SOLN
INTRAMUSCULAR | Status: AC
  Filled 2018-01-30: qty 2

## 2018-01-30 MED ORDER — LACTATED RINGERS IV SOLN
1000.0000 mL | Freq: Once | INTRAVENOUS | Status: AC
  Administered 2018-01-30: 1000 mL via INTRAVENOUS

## 2018-01-30 MED ORDER — LIDOCAINE HCL 2 % IJ SOLN
20.0000 mL | Freq: Once | INTRAMUSCULAR | Status: AC
  Administered 2018-01-30: 400 mg

## 2018-01-30 MED ORDER — ROPIVACAINE HCL 2 MG/ML IJ SOLN
INTRAMUSCULAR | Status: AC
  Filled 2018-01-30: qty 10

## 2018-01-30 NOTE — Patient Instructions (Signed)

## 2018-01-30 NOTE — Progress Notes (Signed)
Safety precautions to be maintained throughout the outpatient stay will include: orient to surroundings, keep bed in low position, maintain call bell within reach at all times, provide assistance with transfer out of bed and ambulation.  

## 2018-01-30 NOTE — Progress Notes (Signed)
Patient's Name: Tammy Lang  MRN: [id number]  Referring Provider: Gillis Santa, MD  DOB: 12/18/1965  PCP: Jearld Fenton, NP  DOS: 01/30/2018  Note by: Gillis Santa, MD  Service setting: Ambulatory outpatient  Specialty: Interventional Pain Management  Patient type: Established  Location: [facility] (AMB) Pain Management Facility  Visit type: Interventional Procedure   Primary Reason for Visit: Interventional Pain Management Treatment. CC: Hip Pain (bilateral) and Elbow Pain (bilateral)  Procedure:          Anesthesia, Analgesia, Anxiolysis:  Type: Diagnostic Sacroiliac Joint Steroid Injection          Region: Superior Lumbosacral Region Level: PIIS (Posterior Inferior Iliac Spine) Laterality: Bilateral  Type: Moderate (Conscious) Sedation combined with Local Anesthesia Indication(s): Analgesia and Anxiety Route: Intravenous (IV) IV Access: Secured Sedation: Meaningful verbal contact was maintained at all times during the procedure  Local Anesthetic: Lidocaine 1-2%  Position: Prone           Indications: 1. Derangement of right SI joint   2. Chronic pain of right elbow    Pain Score: Pre-procedure: 8 /10 Post-procedure: 5 /10  Pre-op Assessment:  Tammy Lang is a 53 y.o. (year old), female patient, seen today for interventional treatment. She  has a past surgical history that includes Cesarean section; Bunionectomy; Ganglion cyst excision; Breast surgery (Right); Dilation and curettage of uterus; and Mass excision (Left, 03/23/2017). Tammy Lang has a current medication list which includes the following prescription(s): albuterol, clotrimazole-betamethasone, hydroxyzine, ibuprofen, lidocaine, multivitamin with minerals, hair skin and nails formula, oxycodone-acetaminophen, oxycodone-acetaminophen, trazodone, and venlafaxine xr, and the following Facility-Administered Medications: fentanyl and lactated ringers. Her primarily concern today is the Hip Pain (bilateral) and Elbow Pain  (bilateral)  Initial Vital Signs:  Pulse/HCG Rate: 100ECG Heart Rate: 91 Temp: 98.5 F (36.9 C) Resp: 18 BP: 127/85 SpO2: 100 %  BMI: Estimated body mass index is 25.25 kg/m as calculated from the following:   Height as of this encounter: 4\' 11"  (1.499 m).   Weight as of this encounter: 125 lb (56.7 kg).  Risk Assessment: Allergies: Reviewed. She is allergic to duloxetine.  Allergy Precautions: None required Coagulopathies: Reviewed. None identified.  Blood-thinner therapy: None at this time Active Infection(s): Reviewed. None identified. Tammy Lang is afebrile  Site Confirmation: Tammy Lang was asked to confirm the procedure and laterality before marking the site Procedure checklist: Completed Consent: Before the procedure and under the influence of no sedative(s), amnesic(s), or anxiolytics, the patient was informed of the treatment options, risks and possible complications. To fulfill our ethical and legal obligations, as recommended by the American Medical Association's Code of Ethics, I have informed the patient of my clinical impression; the nature and purpose of the treatment or procedure; the risks, benefits, and possible complications of the intervention; the alternatives, including doing nothing; the risk(s) and benefit(s) of the alternative treatment(s) or procedure(s); and the risk(s) and benefit(s) of doing nothing. The patient was provided information about the general risks and possible complications associated with the procedure. These may include, but are not limited to: failure to achieve desired goals, infection, bleeding, organ or nerve damage, allergic reactions, paralysis, and death. In addition, the patient was informed of those risks and complications associated to the procedure, such as failure to decrease pain; infection; bleeding; organ or nerve damage with subsequent damage to sensory, motor, and/or autonomic systems, resulting in permanent pain, numbness,  and/or weakness of one or several areas of the body; allergic reactions; (i.e.: anaphylactic reaction); and/or death. Furthermore,  the patient was informed of those risks and complications associated with the medications. These include, but are not limited to: allergic reactions (i.e.: anaphylactic or anaphylactoid reaction(s)); adrenal axis suppression; blood sugar elevation that in diabetics may result in ketoacidosis or comma; water retention that in patients with history of congestive heart failure may result in shortness of breath, pulmonary edema, and decompensation with resultant heart failure; weight gain; swelling or edema; medication-induced neural toxicity; particulate matter embolism and blood vessel occlusion with resultant organ, and/or nervous system infarction; and/or aseptic necrosis of one or more joints. Finally, the patient was informed that Medicine is not an exact science; therefore, there is also the possibility of unforeseen or unpredictable risks and/or possible complications that may result in a catastrophic outcome. The patient indicated having understood very clearly. We have given the patient no guarantees and we have made no promises. Enough time was given to the patient to ask questions, all of which were answered to the patient's satisfaction. Tammy Lang has indicated that she wanted to continue with the procedure. Attestation: I, the ordering provider, attest that I have discussed with the patient the benefits, risks, side-effects, alternatives, likelihood of achieving goals, and potential problems during recovery for the procedure that I have provided informed consent. Date  Time: 01/30/2018 11:23 AM  Pre-Procedure Preparation:  Monitoring: As per clinic protocol. Respiration, ETCO2, SpO2, BP, heart rate and rhythm monitor placed and checked for adequate function Safety Precautions: Patient was assessed for positional comfort and pressure points before starting the  procedure. Time-out: I initiated and conducted the "Time-out" before starting the procedure, as per protocol. The patient was asked to participate by confirming the accuracy of the "Time Out" information. Verification of the correct person, site, and procedure were performed and confirmed by me, the nursing staff, and the patient. "Time-out" conducted as per Joint Commission's Universal Protocol (UP.01.01.01). Time: 1210  Description of Procedure:          Target Area: Superior, posterior, aspect of the sacroiliac fissure Approach: Posterior, paraspinal, ipsilateral approach. Area Prepped: Entire Lower Lumbosacral Region Prepping solution: ChloraPrep (2% chlorhexidine gluconate and 70% isopropyl alcohol) Safety Precautions: Aspiration looking for blood return was conducted prior to all injections. At no point did we inject any substances, as a needle was being advanced. No attempts were made at seeking any paresthesias. Safe injection practices and needle disposal techniques used. Medications properly checked for expiration dates. SDV (single dose vial) medications used. Description of the Procedure: Protocol guidelines were followed. The patient was placed in position over the procedure table. The target area was identified and the area prepped in the usual manner. Skin & deeper tissues infiltrated with local anesthetic. Appropriate amount of time allowed to pass for local anesthetics to take effect. The procedure needle was advanced under fluoroscopic guidance into the sacroiliac joint until a firm endpoint was obtained. Proper needle placement secured. Negative aspiration confirmed. Solution injected in intermittent fashion, asking for systemic symptoms every 0.5cc of injectate. The needles were then removed and the area cleansed, making sure to leave some of the prepping solution back to take advantage of its long term bactericidal properties. Vitals:   01/30/18 1230 01/30/18 1239 01/30/18 1248  01/30/18 1259  BP: (!) 162/83 (!) 170/81 (!) 151/79 (!) 142/74  Pulse: 90     Resp: 14 16 18 18   Temp:  98 F (36.7 C)  98.2 F (36.8 C)  TempSrc:      SpO2: 100% 100% 100% 98%  Weight:  Height:        Start Time: 1210 hrs. End Time: 1229 hrs. Materials:  Needle(s) Type: Spinal Needle Gauge: 22G Length: 3.5-in Medication(s): Please see orders for medications and dosing details.  Imaging Guidance (Non-Spinal):          Type of Imaging Technique: Fluoroscopy Guidance (Non-Spinal) Indication(s): Assistance in needle guidance and placement for procedures requiring needle placement in or near specific anatomical locations not easily accessible without such assistance. Exposure Time: Please see nurses notes. Contrast: Before injecting any contrast, we confirmed that the patient did not have an allergy to iodine, shellfish, or radiological contrast. Once satisfactory needle placement was completed at the desired level, radiological contrast was injected. Contrast injected under live fluoroscopy. No contrast complications. See chart for type and volume of contrast used. Fluoroscopic Guidance: I was personally present during the use of fluoroscopy. "Tunnel Vision Technique" used to obtain the best possible view of the target area. Parallax error corrected before commencing the procedure. "Direction-depth-direction" technique used to introduce the needle under continuous pulsed fluoroscopy. Once target was reached, antero-posterior, oblique, and lateral fluoroscopic projection used confirm needle placement in all planes. Images permanently stored in EMR. Interpretation: I personally interpreted the imaging intraoperatively. Adequate needle placement confirmed in multiple planes. Appropriate spread of contrast into desired area was observed. No evidence of afferent or efferent intravascular uptake. Permanent images saved into the patient's record.  Antibiotic Prophylaxis:   Anti-infectives  (From admission, onward)   None     Indication(s): None identified  Post-operative Assessment:  Post-procedure Vital Signs:  Pulse/HCG Rate: 9094 Temp: 98.2 F (36.8 C) Resp: 18 BP: (!) 142/74 SpO2: 98 %  EBL: None  Complications: No immediate post-treatment complications observed by team, or reported by patient.  Note: The patient tolerated the entire procedure well. A repeat set of vitals were taken after the procedure and the patient was kept under observation following institutional policy, for this type of procedure. Post-procedural neurological assessment was performed, showing return to baseline, prior to discharge. The patient was provided with post-procedure discharge instructions, including a section on how to identify potential problems. Should any problems arise concerning this procedure, the patient was given instructions to immediately contact us, at any time, without hesitation. In any case, we plan to contact the patient by telephone for a follow-up status report regarding this interventional procedure.  Comments:  No additional relevant information.  Plan of Care   Imaging Orders     DG C-Arm 1-60 Min-No Report  Procedure Orders     Large Joint Injection/Arthrocentesis  Patient to follow-up in 8 weeks for medication management.  We will place order for as needed right elbow steroid injection if she has flareup.  Medications ordered for procedure: Meds ordered this encounter  Medications  . lactated ringers infusion 1,000 mL  . fentaNYL (SUBLIMAZE) injection 25-100 mcg    Make sure Narcan is available in the pyxis when using this medication. In the event of respiratory depression (RR< 8/min): Titrate NARCAN (naloxone) in increments of 0.1 to 0.2 mg IV at 2-3 minute intervals, until desired degree of reversal.  . lidocaine (XYLOCAINE) 2 % (with pres) injection 400 mg  . ropivacaine (PF) 2 mg/mL (0.2%) (NAROPIN) injection 10 mL  . dexamethasone (DECADRON)  injection 10 mg   Medications administered: We administered lactated ringers, fentaNYL, lidocaine, ropivacaine (PF) 2 mg/mL (0.2%), and dexamethasone.  See the medical record for exact dosing, route, and time of administration.  Disposition: Discharge home  Discharge Date &  Time: 01/30/2018; 1300 hrs.   Physician-requested Follow-up: Return in about 9 weeks (around 04/03/2018) for Post Procedure Evaluation, Medication Management.  Future Appointments  Date Time Provider [place]  02/21/2018  4:00 PM Lubertha South, [place] [facility] None  03/05/2018  3:00 PM Ursula Alert, MD [facility] None  04/04/2018  8:30 AM Gillis Santa, MD [facility] None   Primary Care Physician: Jearld Fenton, NP Location: [facility] Note by: Gillis Santa, MD Date: 01/30/2018; Time: 1:09 PM  Disclaimer:  Medicine is not an exact science. The only guarantee in medicine is that nothing is guaranteed. It is important to note that the decision to proceed with this intervention was based on the information collected from the patient. The Data and conclusions were drawn from the patient's questionnaire, the interview, and the physical examination. Because the information was provided in large part by the patient, it cannot be guaranteed that it has not been purposely or unconsciously manipulated. Every effort has been made to obtain as much relevant data as possible for this evaluation. It is important to note that the conclusions that lead to this procedure are derived in large part from the available data. Always take into account that the treatment will also be dependent on availability of resources and existing treatment guidelines, considered by other Pain Management Practitioners as being common knowledge and practice, at the time of the intervention. For Medico-Legal purposes, it is also important to point out that variation in procedural techniques and pharmacological choices are  the acceptable norm. The indications, contraindications, technique, and results of the above procedure should only be interpreted and judged by a Board-Certified Interventional Pain Specialist with extensive familiarity and expertise in the same exact procedure and technique.

## 2018-01-30 NOTE — Progress Notes (Signed)
Patient's Name: Tammy Lang  MRN: [id number]  Referring Provider: Gillis Santa, MD  DOB: 1965-06-29  PCP: Jearld Fenton, NP  DOS: 01/30/2018  Note by: Gillis Santa, MD  Service setting: Ambulatory outpatient  Specialty: Interventional Pain Management  Patient type: Established  Location: [facility] (AMB) Pain Management Facility  Visit type: Interventional Procedure   Primary Reason for Visit: Interventional Pain Management Treatment. CC: Hip Pain (bilateral) and Elbow Pain (bilateral)  Procedure:          Anesthesia, Analgesia, Anxiolysis:  Type: Diagnostic Sacroiliac Joint Steroid Injection          Region: Inferior Lumbosacral Region Level: PIIS (Posterior Inferior Iliac Spine) Laterality: Bilateral  Type: Moderate (Conscious) Sedation combined with Local Anesthesia Indication(s): Analgesia and Anxiety Route: Intravenous (IV) IV Access: Secured Sedation: Meaningful verbal contact was maintained at all times during the procedure  Local Anesthetic: Lidocaine 1-2%  Position: Prone           Indications: 1. Chronic left-sided low back pain with left-sided sciatica   2. Derangement of right SI joint    Pain Score: Pre-procedure: 8 /10 Post-procedure: 5 /10  Pre-op Assessment:  Tammy Lang is a 53 y.o. (year old), female patient, seen today for interventional treatment. She  has a past surgical history that includes Cesarean section; Bunionectomy; Ganglion cyst excision; Breast surgery (Right); Dilation and curettage of uterus; and Mass excision (Left, 03/23/2017). Tammy Lang has a current medication list which includes the following prescription(s): albuterol, clotrimazole-betamethasone, hydroxyzine, ibuprofen, lidocaine, multivitamin with minerals, hair skin and nails formula, oxycodone-acetaminophen, oxycodone-acetaminophen, trazodone, and venlafaxine xr, and the following Facility-Administered Medications: fentanyl and lactated ringers. Her primarily concern today is the Hip Pain  (bilateral) and Elbow Pain (bilateral)  Initial Vital Signs:  Pulse/HCG Rate: 100ECG Heart Rate: 91 Temp: 98.5 F (36.9 C) Resp: 18 BP: 127/85 SpO2: 100 %  BMI: Estimated body mass index is 25.25 kg/m as calculated from the following:   Height as of this encounter: 4\' 11"  (1.499 m).   Weight as of this encounter: 125 lb (56.7 kg).  Risk Assessment: Allergies: Reviewed. She is allergic to duloxetine.  Allergy Precautions: None required Coagulopathies: Reviewed. None identified.  Blood-thinner therapy: None at this time Active Infection(s): Reviewed. None identified. Tammy Lang is afebrile  Site Confirmation: Tammy Lang was asked to confirm the procedure and laterality before marking the site Procedure checklist: Completed Consent: Before the procedure and under the influence of no sedative(s), amnesic(s), or anxiolytics, the patient was informed of the treatment options, risks and possible complications. To fulfill our ethical and legal obligations, as recommended by the American Medical Association's Code of Ethics, I have informed the patient of my clinical impression; the nature and purpose of the treatment or procedure; the risks, benefits, and possible complications of the intervention; the alternatives, including doing nothing; the risk(s) and benefit(s) of the alternative treatment(s) or procedure(s); and the risk(s) and benefit(s) of doing nothing. The patient was provided information about the general risks and possible complications associated with the procedure. These may include, but are not limited to: failure to achieve desired goals, infection, bleeding, organ or nerve damage, allergic reactions, paralysis, and death. In addition, the patient was informed of those risks and complications associated to the procedure, such as failure to decrease pain; infection; bleeding; organ or nerve damage with subsequent damage to sensory, motor, and/or autonomic systems, resulting in  permanent pain, numbness, and/or weakness of one or several areas of the body; allergic reactions; (i.e.: anaphylactic reaction);  and/or death. Furthermore, the patient was informed of those risks and complications associated with the medications. These include, but are not limited to: allergic reactions (i.e.: anaphylactic or anaphylactoid reaction(s)); adrenal axis suppression; blood sugar elevation that in diabetics may result in ketoacidosis or comma; water retention that in patients with history of congestive heart failure may result in shortness of breath, pulmonary edema, and decompensation with resultant heart failure; weight gain; swelling or edema; medication-induced neural toxicity; particulate matter embolism and blood vessel occlusion with resultant organ, and/or nervous system infarction; and/or aseptic necrosis of one or more joints. Finally, the patient was informed that Medicine is not an exact science; therefore, there is also the possibility of unforeseen or unpredictable risks and/or possible complications that may result in a catastrophic outcome. The patient indicated having understood very clearly. We have given the patient no guarantees and we have made no promises. Enough time was given to the patient to ask questions, all of which were answered to the patient's satisfaction. Tammy Lang has indicated that she wanted to continue with the procedure. Attestation: I, the ordering provider, attest that I have discussed with the patient the benefits, risks, side-effects, alternatives, likelihood of achieving goals, and potential problems during recovery for the procedure that I have provided informed consent. Date  Time: 01/30/2018 11:23 AM  Pre-Procedure Preparation:  Monitoring: As per clinic protocol. Respiration, ETCO2, SpO2, BP, heart rate and rhythm monitor placed and checked for adequate function Safety Precautions: Patient was assessed for positional comfort and pressure points  before starting the procedure. Time-out: I initiated and conducted the "Time-out" before starting the procedure, as per protocol. The patient was asked to participate by confirming the accuracy of the "Time Out" information. Verification of the correct person, site, and procedure were performed and confirmed by me, the nursing staff, and the patient. "Time-out" conducted as per Joint Commission's Universal Protocol (UP.01.01.01). Time: 1210  Description of Procedure:          Target Area: Inferior, posterior, aspect of the sacroiliac fissure Approach: Posterior, paraspinal, ipsilateral approach. Area Prepped: Entire Lower Lumbosacral Region Prepping solution: ChloraPrep (2% chlorhexidine gluconate and 70% isopropyl alcohol) Safety Precautions: Aspiration looking for blood return was conducted prior to all injections. At no point did we inject any substances, as a needle was being advanced. No attempts were made at seeking any paresthesias. Safe injection practices and needle disposal techniques used. Medications properly checked for expiration dates. SDV (single dose vial) medications used. Description of the Procedure: Protocol guidelines were followed. The patient was placed in position over the procedure table. The target area was identified and the area prepped in the usual manner. Skin & deeper tissues infiltrated with local anesthetic. Appropriate amount of time allowed to pass for local anesthetics to take effect. The procedure needle was advanced under fluoroscopic guidance into the sacroiliac joint until a firm endpoint was obtained. Proper needle placement secured. Negative aspiration confirmed. Solution injected in intermittent fashion, asking for systemic symptoms every 0.5cc of injectate. The needles were then removed and the area cleansed, making sure to leave some of the prepping solution back to take advantage of its long term bactericidal properties. Vitals:   01/30/18 1230 01/30/18 1239  01/30/18 1248 01/30/18 1259  BP: (!) 162/83 (!) 170/81 (!) 151/79 (!) 142/74  Pulse: 90     Resp: 14 16 18 18   Temp:  98 F (36.7 C)  98.2 F (36.8 C)  TempSrc:      SpO2: 100% 100% 100% 98%  Weight:      Height:        Start Time: 1210 hrs. End Time: 1229 hrs. Materials:  Needle(s) Type: Spinal Needle Gauge: 25G Length: 3.5-in Medication(s): Please see orders for medications and dosing details. 10 cc solution made of 9 cc of 0.2% ropivacaine, 1 cc of Decadron 10 mg/cc.  2.5 cc injected intra-articular on the left, 2.5 cc injected periarticular on the left.  2.5 cc injected intra-articular on the right, 2.5 cc injected periarticular on the right Imaging Guidance (Non-Spinal):          Type of Imaging Technique: Fluoroscopy Guidance (Non-Spinal) Indication(s): Assistance in needle guidance and placement for procedures requiring needle placement in or near specific anatomical locations not easily accessible without such assistance. Exposure Time: Please see nurses notes. Contrast: None used. Fluoroscopic Guidance: I was personally present during the use of fluoroscopy. "Tunnel Vision Technique" used to obtain the best possible view of the target area. Parallax error corrected before commencing the procedure. "Direction-depth-direction" technique used to introduce the needle under continuous pulsed fluoroscopy. Once target was reached, antero-posterior, oblique, and lateral fluoroscopic projection used confirm needle placement in all planes. Images permanently stored in EMR. Interpretation: No contrast injected.  Antibiotic Prophylaxis:   Anti-infectives (From admission, onward)   None     Indication(s): None identified  Post-operative Assessment:  Post-procedure Vital Signs:  Pulse/HCG Rate: 9094 Temp: 98.2 F (36.8 C) Resp: 18 BP: (!) 142/74 SpO2: 98 %  EBL: None  Complications: No immediate post-treatment complications observed by team, or reported by patient.  Note:  The patient tolerated the entire procedure well. A repeat set of vitals were taken after the procedure and the patient was kept under observation following institutional policy, for this type of procedure. Post-procedural neurological assessment was performed, showing return to baseline, prior to discharge. The patient was provided with post-procedure discharge instructions, including a section on how to identify potential problems. Should any problems arise concerning this procedure, the patient was given instructions to immediately contact us, at any time, without hesitation. In any case, we plan to contact the patient by telephone for a follow-up status report regarding this interventional procedure.  Comments:  No additional relevant information.  Plan of Care   Imaging Orders     DG C-Arm 1-60 Min-No Report  Procedure Orders     Large Joint Injection/Arthrocentesis Patient to follow-up in approximately 8 weeks for medication management and postprocedural evaluation.  I have also place PRN order for patient to have left elbow steroid injection should she flareup of her elbow pain.  Medications ordered for procedure: Meds ordered this encounter  Medications  . lactated ringers infusion 1,000 mL  . fentaNYL (SUBLIMAZE) injection 25-100 mcg    Make sure Narcan is available in the pyxis when using this medication. In the event of respiratory depression (RR< 8/min): Titrate NARCAN (naloxone) in increments of 0.1 to 0.2 mg IV at 2-3 minute intervals, until desired degree of reversal.  . lidocaine (XYLOCAINE) 2 % (with pres) injection 400 mg  . ropivacaine (PF) 2 mg/mL (0.2%) (NAROPIN) injection 10 mL  . dexamethasone (DECADRON) injection 10 mg   Medications administered: We administered lactated ringers, fentaNYL, lidocaine, ropivacaine (PF) 2 mg/mL (0.2%), and dexamethasone.  See the medical record for exact dosing, route, and time of administration.  Disposition: Discharge home   Discharge Date & Time: 01/30/2018; 1300 hrs.   Physician-requested Follow-up: Return in about 9 weeks (around 04/03/2018) for Post Procedure Evaluation, Medication Management.  Future  Appointments  Date Time Provider [place]  02/21/2018  4:00 PM Lubertha South, [place] [facility] None  03/05/2018  3:00 PM Ursula Alert, MD [facility] None  04/04/2018  8:30 AM Gillis Santa, MD [facility] None   Primary Care Physician: Jearld Fenton, NP Location: [facility] Note by: Gillis Santa, MD Date: 01/30/2018; Time: 1:05 PM  Disclaimer:  Medicine is not an exact science. The only guarantee in medicine is that nothing is guaranteed. It is important to note that the decision to proceed with this intervention was based on the information collected from the patient. The Data and conclusions were drawn from the patient's questionnaire, the interview, and the physical examination. Because the information was provided in large part by the patient, it cannot be guaranteed that it has not been purposely or unconsciously manipulated. Every effort has been made to obtain as much relevant data as possible for this evaluation. It is important to note that the conclusions that lead to this procedure are derived in large part from the available data. Always take into account that the treatment will also be dependent on availability of resources and existing treatment guidelines, considered by other Pain Management Practitioners as being common knowledge and practice, at the time of the intervention. For Medico-Legal purposes, it is also important to point out that variation in procedural techniques and pharmacological choices are the acceptable norm. The indications, contraindications, technique, and results of the above procedure should only be interpreted and judged by a Board-Certified Interventional Pain Specialist with extensive familiarity and expertise in the same exact procedure  and technique.

## 2018-01-31 ENCOUNTER — Telehealth: Payer: Self-pay | Admitting: *Deleted

## 2018-01-31 NOTE — Telephone Encounter (Signed)
No problems post procedure. 

## 2018-02-01 MED ORDER — PRAVASTATIN SODIUM 10 MG PO TABS: 10.0000 mg | ORAL_TABLET | Freq: Every day | ORAL | 2 refills | Status: DC

## 2018-02-01 NOTE — Addendum Note (Signed)
Addended by: Lurlean Nanny on: 02/01/2018 08:14 AM   Modules accepted: Orders

## 2018-02-03 ENCOUNTER — Other Ambulatory Visit: Payer: Self-pay | Admitting: Psychiatry

## 2018-02-03 DIAGNOSIS — F411 Generalized anxiety disorder: Secondary | ICD-10-CM

## 2018-02-04 ENCOUNTER — Ambulatory Visit (INDEPENDENT_AMBULATORY_CARE_PROVIDER_SITE_OTHER)
Admission: RE | Admit: 2018-02-04 | Discharge: 2018-02-04 | Disposition: A | Discharge status: Home / Self Care | Payer: 59 | Source: Ambulatory Visit | Attending: Internal Medicine | Admitting: Internal Medicine

## 2018-02-04 ENCOUNTER — Encounter: Payer: Self-pay | Admitting: Internal Medicine

## 2018-02-04 ENCOUNTER — Ambulatory Visit (INDEPENDENT_AMBULATORY_CARE_PROVIDER_SITE_OTHER): Payer: 59 | Admitting: Internal Medicine

## 2018-02-04 VITALS — BP 130/82 | HR 79 | Temp 98.0°F | Wt 129.0 lb

## 2018-02-04 DIAGNOSIS — J3489 Other specified disorders of nose and nasal sinuses: Secondary | ICD-10-CM

## 2018-02-04 DIAGNOSIS — R05 Cough: Secondary | ICD-10-CM

## 2018-02-04 DIAGNOSIS — R059 Cough, unspecified: Secondary | ICD-10-CM

## 2018-02-04 DIAGNOSIS — R0602 Shortness of breath: Secondary | ICD-10-CM | POA: Diagnosis not present

## 2018-02-04 DIAGNOSIS — R062 Wheezing: Secondary | ICD-10-CM

## 2018-02-04 DIAGNOSIS — R5383 Other fatigue: Secondary | ICD-10-CM

## 2018-02-04 MED ORDER — IPRATROPIUM-ALBUTEROL 0.5-2.5 (3) MG/3ML IN SOLN
3.0000 mL | Freq: Once | RESPIRATORY_TRACT | Status: AC
  Administered 2018-02-04: 3 mL via RESPIRATORY_TRACT

## 2018-02-04 NOTE — Progress Notes (Signed)
HPI  Pt presents to the clinic today with c/o fatigue, runny nose, nasal congestion, cough, wheezing and shortness of breath. She reports this started 2-3 days ago. She is not able to blow anything out of her nose.The cough is non productive. She denies ear pain, sore throat. She denies fever or body aches, but has had chills. She was seen 1 month ago, diagnosed with acute bronchitis. She was treated with Prednisone, Azithromycin but reports she just has not felt right since then. She does continue to smoke.  Review of Systems      Past Medical History:  Diagnosis Date  . Allergy   . Anxiety   . Arthritis   . Asthma   . Lipoma of chest wall 03/23/2017  . Spinal stenosis of cervical region     Family History  Problem Relation Age of Onset  . Alcohol abuse Mother   . Cervical cancer Mother   . Hodgkin's lymphoma Sister   . ADD / ADHD Son   . Drug abuse Son     Social History   Socioeconomic History  . Marital status: Married    Spouse name: vincent  . Number of children: 2  . Years of education: Not on file  . Highest education level: High school graduate  Occupational History  . Not on file  Social Needs  . Financial resource strain: Not hard at all  . Food insecurity:    Worry: Never true    Inability: Never true  . Transportation needs:    Medical: No    Non-medical: No  Tobacco Use  . Smoking status: Current Every Day Smoker    Packs/day: 0.25    Years: 30.00    Pack years: 7.50    Types: Cigarettes  . Smokeless tobacco: Never Used  Substance and Sexual Activity  . Alcohol use: Yes    Comment: occasional  . Drug use: No  . Sexual activity: Yes    Birth control/protection: Surgical  Lifestyle  . Physical activity:    Days per week: 0 days    Minutes per session: 0 min  . Stress: Very much  Relationships  . Social connections:    Talks on phone: Not on file    Gets together: Not on file    Attends religious service: Never    Active member of club or  organization: No    Attends meetings of clubs or organizations: Never    Relationship status: Married  . Intimate partner violence:    Fear of current or ex partner: No    Emotionally abused: No    Physically abused: No    Forced sexual activity: No  Other Topics Concern  . Not on file  Social History Narrative  . Not on file    Allergies  Allergen Reactions  . Duloxetine     Raises BP     Constitutional: Positive fatigue and chills. Denies headache, fever or abrupt weight changes.  HEENT:  Positive runny nose, nasal congestion. Denies eye redness, eye pain, pressure behind the eyes, facial pain, ear pain, ringing in the ears, wax buildup, or bloody nose. Respiratory: Positive cough, wheezing and shortness of breath. Denies difficulty breathing Cardiovascular: Denies chest pain, chest tightness, palpitations or swelling in the hands or feet.   No other specific complaints in a complete review of systems (except as listed in HPI above).  Objective:   BP 130/82   Pulse 79   Temp 98 F (36.7 C) (Oral)  Wt 129 lb (58.5 kg)   LMP 02/12/2011   SpO2 97%   BMI 26.05 kg/m  Wt Readings from Last 3 Encounters:  02/04/18 129 lb (58.5 kg)  01/30/18 125 lb (56.7 kg)  01/24/18 125 lb (56.7 kg)     General: Appears her stated age, well developed, well nourished in NAD. HEENT: Head: normal shape and size, no sinus tenderness noted; Ears: Tm's gray and intact, normal light reflex; Nose: mucosa pink and moist, septum midline; Throat/Mouth: + PND. Teeth present, mucosa pink and moist, no exudate noted, no lesions or ulcerations noted.  Neck: No cervical lymphadenopathy.  Cardiovascular: Normal rate and rhythm. S1,S2 noted.  No murmur, rubs or gallops noted.  Pulmonary/Chest: Normal effort and positive vesicular breath sounds with bilateral expiratory wheezing noted. No respiratory distress. No rales or ronchi noted.       Assessment & Plan:   Fatigue, Runny Nose, Nasal Congestion,  Cough, Wheezing, SOB:  Get some rest and drink plenty of water Duoneb given in office Chest xray for further evaluation  Will follow up after chest xray. Will likely need more steroids, antibiotic only if chest xray is positive   Webb Silversmith, NP

## 2018-02-04 NOTE — Addendum Note (Signed)
Addended by: Lurlean Nanny on: 02/04/2018 04:40 PM   Modules accepted: Orders

## 2018-02-04 NOTE — Patient Instructions (Signed)

## 2018-02-05 ENCOUNTER — Telehealth: Payer: Self-pay | Admitting: *Deleted

## 2018-02-05 ENCOUNTER — Other Ambulatory Visit: Payer: Self-pay | Admitting: Internal Medicine

## 2018-02-05 MED ORDER — PREDNISONE 10 MG PO TABS: ORAL_TABLET | ORAL | 0 refills | Status: DC

## 2018-02-05 NOTE — Telephone Encounter (Signed)
Patient left a voicemail requesting the results of her x-ray.

## 2018-02-05 NOTE — Telephone Encounter (Signed)
See result note for CXR

## 2018-02-21 ENCOUNTER — Ambulatory Visit: Payer: 59 | Admitting: Licensed Clinical Social Worker

## 2018-03-04 ENCOUNTER — Other Ambulatory Visit: Payer: Self-pay | Admitting: Internal Medicine

## 2018-03-04 DIAGNOSIS — F330 Major depressive disorder, recurrent, mild: Secondary | ICD-10-CM

## 2018-03-04 NOTE — Telephone Encounter (Signed)
Looks like [facility] is filling Rx, looks like sig was changed as well

## 2018-03-05 ENCOUNTER — Encounter: Payer: Self-pay | Admitting: Psychiatry

## 2018-03-05 ENCOUNTER — Ambulatory Visit (INDEPENDENT_AMBULATORY_CARE_PROVIDER_SITE_OTHER): Payer: 59 | Admitting: Psychiatry

## 2018-03-05 ENCOUNTER — Other Ambulatory Visit: Payer: Self-pay

## 2018-03-05 VITALS — BP 128/83 | HR 85 | Temp 98.9°F | Wt 127.4 lb

## 2018-03-05 DIAGNOSIS — F411 Generalized anxiety disorder: Secondary | ICD-10-CM

## 2018-03-05 DIAGNOSIS — F330 Major depressive disorder, recurrent, mild: Secondary | ICD-10-CM | POA: Diagnosis not present

## 2018-03-05 DIAGNOSIS — F5105 Insomnia due to other mental disorder: Secondary | ICD-10-CM

## 2018-03-05 DIAGNOSIS — F17200 Nicotine dependence, unspecified, uncomplicated: Secondary | ICD-10-CM

## 2018-03-05 MED ORDER — VENLAFAXINE HCL ER 75 MG PO CP24: 75.0000 mg | ORAL_CAPSULE | Freq: Every day | ORAL | 0 refills | Status: DC

## 2018-03-05 MED ORDER — TRAZODONE HCL 100 MG PO TABS: 150.0000 mg | ORAL_TABLET | Freq: Every day | ORAL | 0 refills | Status: DC

## 2018-03-05 MED ORDER — PROPRANOLOL HCL 10 MG PO TABS: 10.0000 mg | ORAL_TABLET | Freq: Two times a day (BID) | ORAL | 1 refills | Status: DC | PRN

## 2018-03-05 NOTE — Patient Instructions (Signed)
Propranolol tablets  What is this medicine?  PROPRANOLOL (proe PRAN oh lole) is a beta-blocker. Beta-blockers reduce the workload on the heart and help it to beat more regularly. This medicine is used to treat high blood pressure, to control irregular heart rhythms (arrhythmias) and to relieve chest pain caused by angina. It may also be helpful after a heart attack. This medicine is also used to prevent migraine headaches, relieve uncontrollable shaking (tremors), and help certain problems related to the thyroid gland and adrenal gland.  This medicine may be used for other purposes; ask your health care provider or pharmacist if you have questions.  COMMON BRAND NAME(S): Inderal  What should I tell my health care provider before I take this medicine?  They need to know if you have any of these conditions:  -circulation problems or blood vessel disease  -diabetes  -history of heart attack or heart disease, vasospastic angina  -kidney disease  -liver disease  -lung or breathing disease, like asthma or emphysema  -pheochromocytoma  -slow heart rate  -thyroid disease  -an unusual or allergic reaction to propranolol, other beta-blockers, medicines, foods, dyes, or preservatives  -pregnant or trying to get pregnant  -breast-feeding  How should I use this medicine?  Take this medicine by mouth with a glass of water. Follow the directions on the prescription label. Take your doses at regular intervals. Do not take your medicine more often than directed. Do not stop taking except on your the advice of your doctor or health care professional.  Talk to your pediatrician regarding the use of this medicine in children. Special care may be needed.  Overdosage: If you think you have taken too much of this medicine contact a poison control center or emergency room at once.  NOTE: This medicine is only for you. Do not share this medicine with others.  What if I miss a dose?  If you miss a dose, take it as soon as you can. If it is  almost time for your next dose, take only that dose. Do not take double or extra doses.  What may interact with this medicine?  Do not take this medicine with any of the following medications:  -feverfew  -phenothiazines like chlorpromazine, mesoridazine, prochlorperazine, thioridazine  This medicine may also interact with the following medications:  -aluminum hydroxide gel  -antipyrine  -antiviral medicines for HIV or AIDS  -barbiturates like phenobarbital  -certain medicines for blood pressure, heart disease, irregular heart beat  -cimetidine  -ciprofloxacin  -diazepam  -fluconazole  -haloperidol  -isoniazid  -medicines for cholesterol like cholestyramine or colestipol  -medicines for mental depression  -medicines for migraine headache like almotriptan, eletriptan, frovatriptan, naratriptan, rizatriptan, sumatriptan, zolmitriptan  -NSAIDs, medicines for pain and inflammation, like ibuprofen or naproxen  -phenytoin  -rifampin  -teniposide  -theophylline  -thyroid medicines  -tolbutamide  -warfarin  -zileuton  This list may not describe all possible interactions. Give your health care provider a list of all the medicines, herbs, non-prescription drugs, or dietary supplements you use. Also tell them if you smoke, drink alcohol, or use illegal drugs. Some items may interact with your medicine.  What should I watch for while using this medicine?  Visit your doctor or health care professional for regular check ups. Check your blood pressure and pulse rate regularly. Ask your health care professional what your blood pressure and pulse rate should be, and when you should contact them.  You may get drowsy or dizzy. Do not drive, use machinery, or   do anything that needs mental alertness until you know how this drug affects you. Do not stand or sit up quickly, especially if you are an older patient. This reduces the risk of dizzy or fainting spells. Alcohol can make you more drowsy and dizzy. Avoid alcoholic drinks.  This  medicine can affect blood sugar levels. If you have diabetes, check with your doctor or health care professional before you change your diet or the dose of your diabetic medicine.  Do not treat yourself for coughs, colds, or pain while you are taking this medicine without asking your doctor or health care professional for advice. Some ingredients may increase your blood pressure.  What side effects may I notice from receiving this medicine?  Side effects that you should report to your doctor or health care professional as soon as possible:  -allergic reactions like skin rash, itching or hives, swelling of the face, lips, or tongue  -breathing problems  -changes in blood sugar  -cold hands or feet  -difficulty sleeping, nightmares  -dry peeling skin  -hallucinations  -muscle cramps or weakness  -slow heart rate  -swelling of the legs and ankles  -vomiting  Side effects that usually do not require medical attention (report to your doctor or health care professional if they continue or are bothersome):  -change in sex drive or performance  -diarrhea  -dry sore eyes  -hair loss  -nausea  -weak or tired  This list may not describe all possible side effects. Call your doctor for medical advice about side effects. You may report side effects to FDA at 1-800-FDA-1088.  Where should I keep my medicine?  Keep out of the reach of children.  Store at room temperature between 15 and 30 degrees C (59 and 86 degrees F). Protect from light. Throw away any unused medicine after the expiration date.  NOTE: This sheet is a summary. It may not cover all possible information. If you have questions about this medicine, talk to your doctor, pharmacist, or health care provider.   2019 Elsevier/Gold Standard (2012-08-30 14:51:53)

## 2018-03-05 NOTE — Progress Notes (Signed)
[place] MD OP Progress Note  03/05/2018 4:49 PM Tammy Lang  MRN:  [id number]  Chief Complaint: ' I am here for follow up.' Chief Complaint    Follow-up; Medication Refill     HPI: Tammy Lang is a 53 year old Hispanic female, married, employed, lives in [place], has a history of depression, anxiety, chronic pain, hyperlipidemia, presented to clinic today for a follow-up visit.  Patient today reports she continues to struggle with bronchitis and sinusitis.  This has been going on since the past several weeks.  She reports even though she tried multiple medication she continues to struggle with symptoms.  She reports she would like to go back to her primary medical doctor for another evaluation.  Patient reports she tried the hydroxyzine as needed and it made her hands numb.  She hence does not want to take it anymore.  Patient would like to have another medication which she can take as needed for anxiety symptoms.  Discussed propranolol.  Discussed with her to wait until she feels better with her bronchitis and then start taking it.  Patient reports sleep is okay on the trazodone.  She does not want any additional medications.  Patient continues to struggle with psychosocial stressors of her son's substance abuse problems.  He recently was arrested.  Patient reports she does not know all the details however she continues to worry about him.  Patient reports Effexor at this dosage is helpful.  She wants to stay on the same dose at this time.  Patient will continue psychotherapy sessions with Ms. Peacock. Visit Diagnosis:    ICD-10-CM   1. GAD (generalized anxiety disorder) F41.1 venlafaxine XR (EFFEXOR XR) 75 MG 24 hr capsule    traZODone (DESYREL) 100 MG tablet    propranolol (INDERAL) 10 MG tablet  2. MDD (major depressive disorder), recurrent episode, mild (HCC) F33.0 venlafaxine XR (EFFEXOR XR) 75 MG 24 hr capsule  3. Insomnia due to mental condition F51.05 traZODone (DESYREL) 100 MG  tablet  4. Tobacco use disorder F17.200     Past Psychiatric History: I have reviewed past psychiatric history from my progress note on 12/26/2017.  Past trials of Wellbutrin, Cymbalta, venlafaxine, hydroxyzine, trazodone, Xanax  Past Medical History:  Past Medical History:  Diagnosis Date  . Allergy   . Anxiety   . Arthritis   . Asthma   . Lipoma of chest wall 03/23/2017  . Spinal stenosis of cervical region     Past Surgical History:  Procedure Laterality Date  . BREAST SURGERY Right    benign tumor  . BUNIONECTOMY    . CESAREAN SECTION    . DILATION AND CURETTAGE OF UTERUS    . GANGLION CYST EXCISION    . MASS EXCISION Left 03/23/2017   Procedure: EXCISION OF LEFT CHEST WALL MASS;  Surgeon: Fanny Skates, MD;  Location: [facility] ORS;  Service: General;  Laterality: Left;    Family Psychiatric History: I have reviewed family psychiatric history from my progress note on 12/26/2017  Family History:  Family History  Problem Relation Age of Onset  . Alcohol abuse Mother   . Cervical cancer Mother   . Hodgkin's lymphoma Sister   . ADD / ADHD Son   . Drug abuse Son     Social History: Have reviewed social history from my progress note on 12/26/2017 Social History   Socioeconomic History  . Marital status: Married    Spouse name: vincent  . Number of children: 2  . Years of education: Not on  file  . Highest education level: High school graduate  Occupational History  . Not on file  Social Needs  . Financial resource strain: Not hard at all  . Food insecurity:    Worry: Never true    Inability: Never true  . Transportation needs:    Medical: No    Non-medical: No  Tobacco Use  . Smoking status: Current Every Day Smoker    Packs/day: 0.25    Years: 30.00    Pack years: 7.50    Types: Cigarettes  . Smokeless tobacco: Never Used  Substance and Sexual Activity  . Alcohol use: Yes    Comment: occasional  . Drug use: No  . Sexual activity: Yes    Birth  control/protection: Surgical  Lifestyle  . Physical activity:    Days per week: 0 days    Minutes per session: 0 min  . Stress: Very much  Relationships  . Social connections:    Talks on phone: Not on file    Gets together: Not on file    Attends religious service: Never    Active member of club or organization: No    Attends meetings of clubs or organizations: Never    Relationship status: Married  Other Topics Concern  . Not on file  Social History Narrative  . Not on file    Allergies:  Allergies  Allergen Reactions  . Duloxetine     Raises BP  . Vistaril [Hydroxyzine Hcl]     Hands go numb    Metabolic Disorder Labs: No results found for: HGBA1C, MPG No results found for: PROLACTIN Lab Results  Component Value Date   CHOL 252 (H) 01/25/2018   TRIG 235 (H) 01/25/2018   HDL 65 01/25/2018   CHOLHDL 3.9 01/25/2018   LDLCALC 149 (H) 01/25/2018   No results found for: TSH  Therapeutic Level Labs: No results found for: LITHIUM No results found for: VALPROATE No components found for:  CBMZ  Current Medications: Current Outpatient Medications  Medication Sig Dispense Refill  . albuterol (PROVENTIL HFA;VENTOLIN HFA) 108 (90 Base) MCG/ACT inhaler Inhale 2 puffs into the lungs every 6 (six) hours as needed for wheezing or shortness of breath. 1 Inhaler 1  . clotrimazole-betamethasone (LOTRISONE) cream Apply 1 application topically 2 (two) times daily. For up to 2 weeks 45 g 0  . ibuprofen (ADVIL,MOTRIN) 800 MG tablet TAKE 1 TABLET BY MOUTH TWICE A DAY WITH FOOD AS NEEDED FOR MODERATE PAIN 60 tablet 3  . lidocaine (LIDODERM) 5 % Place 1 patch onto the skin daily. Remove & Discard patch within 12 hours or as directed by MD 30 patch 2  . Multiple Vitamin (MULTIVITAMIN WITH MINERALS) TABS tablet Take 1 tablet by mouth daily.    . Multiple Vitamins-Minerals (HAIR SKIN AND NAILS FORMULA) TABS Take 1 tablet by mouth daily.    Derrill Memo ON 03/11/2018] oxyCODONE-acetaminophen  (PERCOCET) 10-325 MG tablet Take 1 tablet by mouth every 8 (eight) hours as needed for up to 30 days for pain. 90 tablet 0  . pravastatin (PRAVACHOL) 10 MG tablet Take 1 tablet (10 mg total) by mouth at bedtime. 30 tablet 2  . traZODone (DESYREL) 100 MG tablet Take 1.5 tablets (150 mg total) by mouth at bedtime. 135 tablet 0  . propranolol (INDERAL) 10 MG tablet Take 1 tablet (10 mg total) by mouth 2 (two) times daily as needed. For severe anxiety 60 tablet 1  . venlafaxine XR (EFFEXOR XR) 75 MG 24 hr capsule  Take 1 capsule (75 mg total) by mouth daily with breakfast. Mood 90 capsule 0   No current facility-administered medications for this visit.      Musculoskeletal: Strength & Muscle Tone: within normal limits Gait & Station: normal Patient leans: N/A  Psychiatric Specialty Exam: Review of Systems  Respiratory: Positive for cough.   Psychiatric/Behavioral: The patient is nervous/anxious.   All other systems reviewed and are negative.   Blood pressure 128/83, pulse 85, temperature 98.9 F (37.2 C), temperature source Oral, weight 127 lb 6.4 oz (57.8 kg), last menstrual period 02/12/2011.Body mass index is 25.73 kg/m.  General Appearance: Casual  Eye Contact:  Fair  Speech:  Clear and Coherent  Volume:  Normal  Mood:  Anxious  Affect:  Congruent  Thought Process:  Goal Directed and Descriptions of Associations: Intact  Orientation:  Full (Time, Place, and Person)  Thought Content: Logical   Suicidal Thoughts:  No  Homicidal Thoughts:  No  Memory:  Immediate;   Fair Recent;   Fair Remote;   Fair  Judgement:  Fair  Insight:  Fair  Psychomotor Activity:  Normal  Concentration:  Concentration: Fair and Attention Span: Fair  Recall:  [employer] of Knowledge: Fair  Language: Fair  Akathisia:  No  Handed:  Right  AIMS (if indicated): denies tremors, rigidity,stiffness  Assets:  Communication Skills Desire for Improvement Social Support  ADL's:  Intact  Cognition: WNL   Sleep:  Fair   Screenings: GAD-7     Office Visit from 11/09/2017 in [place] at [facility]  Total GAD-7 Score  16    PHQ2-9     Procedure visit from 01/30/2018 in [place] Clinical Support from 01/24/2018 in [place] Clinical Support from 11/27/2017 in [place] Office Visit from 10/29/2017 in [place] at [place] from 08/28/2017 in [place]  PHQ-2 Total Score  0  0  0  0  0       Assessment and Plan: Chani is a 53 year old Hispanic female who has a history of depression, anxiety, insomnia, hyperlipidemia, presented to clinic today for a follow-up visit.  Patient is biologically predisposed given her family history.  She also has psychosocial stressors of her son having drug abuse and mental health problems.  She also has a history of trauma.  Patient is currently struggling with bronchitis.  Patient will continue to benefit from medications as well as psychotherapy sessions.  Plan MDD-improving Effexor XR 75 mg p.o. daily with breakfast Continue CBT  For GAD- unstable Effexor XR 75 mg p.o. daily Discontinue hydroxyzine for side effects. Start propranolol 10 mg p.o. twice daily as needed for anxiety attacks  For insomnia-improving Trazodone 150 to 200 mg p.o. nightly  For tobacco use disorder-unstable Provided smoking cessation counseling.  Follow-up in clinic in 2 months or sooner if needed.  I have spent atleast 15 minutes face to face with patient today. More than 50 % of the time was spent for psychoeducation and supportive psychotherapy and care coordination.  This note was generated in part or whole with voice recognition software. Voice recognition is usually quite accurate but there are transcription errors that can and very often do occur. I  apologize for any typographical errors that were not detected and corrected.      Ursula Alert, MD 03/05/2018, 4:49 PM

## 2018-03-07 ENCOUNTER — Ambulatory Visit: Payer: 59 | Admitting: Family Medicine

## 2018-03-07 ENCOUNTER — Ambulatory Visit (INDEPENDENT_AMBULATORY_CARE_PROVIDER_SITE_OTHER)
Admission: RE | Admit: 2018-03-07 | Discharge: 2018-03-07 | Disposition: A | Discharge status: Home / Self Care | Payer: 59 | Source: Ambulatory Visit | Attending: Family Medicine | Admitting: Family Medicine

## 2018-03-07 ENCOUNTER — Encounter: Payer: Self-pay | Admitting: Family Medicine

## 2018-03-07 ENCOUNTER — Telehealth: Payer: Self-pay

## 2018-03-07 VITALS — BP 126/74 | HR 91 | Temp 98.2°F | Resp 18 | Ht 59.0 in | Wt 130.5 lb

## 2018-03-07 DIAGNOSIS — R059 Cough, unspecified: Secondary | ICD-10-CM

## 2018-03-07 DIAGNOSIS — R05 Cough: Secondary | ICD-10-CM

## 2018-03-07 DIAGNOSIS — J0100 Acute maxillary sinusitis, unspecified: Secondary | ICD-10-CM

## 2018-03-07 MED ORDER — ALBUTEROL SULFATE HFA 108 (90 BASE) MCG/ACT IN AERS: 2.0000 | INHALATION_SPRAY | Freq: Four times a day (QID) | RESPIRATORY_TRACT | 1 refills | Status: DC | PRN

## 2018-03-07 MED ORDER — HYDROCODONE-HOMATROPINE 5-1.5 MG/5ML PO SYRP: 5.0000 mL | ORAL_SOLUTION | Freq: Four times a day (QID) | ORAL | 0 refills | Status: DC | PRN

## 2018-03-07 MED ORDER — AMOXICILLIN-POT CLAVULANATE 875-125 MG PO TABS: 1.0000 | ORAL_TABLET | Freq: Two times a day (BID) | ORAL | 0 refills | Status: AC

## 2018-03-07 NOTE — Telephone Encounter (Signed)
Patient calls in to request a refill on her inhaler (albuterol) and also to report progression with her cold symptoms and to ask what to do next.  Cough is non productive  Wheezing and SOB is getting worse Chest congestion Headache   Denies chest pain and is afebrile.  Patient last seen for symptoms 1 month ago, needs re-eval.    Appointment offered today with Avie Echevaria, NP at 145, patient refused and took appointment tomorrow (03/08/18) at 330pm.   In meantime, I did go ahead and give her 2 refills on her inhaler.    Avie Echevaria, NP aware.

## 2018-03-07 NOTE — Progress Notes (Signed)
Subjective:     Tammy Lang is a 53 y.o. female presenting for Cough (symptoms present since December. has been seen several times by urgent care and [facility] Wheezing, dyspnea, chills, body aches.)     Cough  This is a new problem. Associated symptoms include chills, ear congestion, a fever, headaches, myalgias, nasal congestion, rhinorrhea, shortness of breath and wheezing. Pertinent negatives include no ear pain. She has tried a beta-agonist inhaler (abx) for the symptoms. The treatment provided no relief.    Went to urgent care 2 times. Was tested for the flu Had to miss work about 2 weeks ago Prednisone did nothing to help  Urgent care - diagnosed with sinusitis and bronchitis > given abx w/o improvement  Using nasal spray   Review of Systems  Constitutional: Positive for chills and fever.  HENT: Positive for rhinorrhea. Negative for ear pain.   Respiratory: Positive for cough, shortness of breath and wheezing.   Musculoskeletal: Positive for myalgias.  Neurological: Positive for headaches.   02/04/2018: Clinic - Cough symptoms. CXR clear lungs >given prednisone 02/04/2018: Telephone - wheezing and SOB worse, non-productive cough, chest congestion, HA. Afebrile   Social History   Tobacco Use  Smoking Status Current Every Day Smoker  . Packs/day: 0.25  . Years: 30.00  . Pack years: 7.50  . Types: Cigarettes  Smokeless Tobacco Never Used        Objective:    BP Readings from Last 3 Encounters:  03/07/18 126/74  02/04/18 130/82  01/30/18 (!) 142/74   Wt Readings from Last 3 Encounters:  03/07/18 130 lb 8 oz (59.2 kg)  02/04/18 129 lb (58.5 kg)  01/30/18 125 lb (56.7 kg)    BP 126/74   Pulse 91   Temp 98.2 F (36.8 C)   Resp 18   Ht 4\' 11"  (1.499 m)   Wt 130 lb 8 oz (59.2 kg)   LMP 02/12/2011   SpO2 97%   BMI 26.36 kg/m    Physical Exam Constitutional:      General: She is not in acute distress.    Appearance: She is  well-developed. She is not diaphoretic.  HENT:     Head: Normocephalic and atraumatic.     Right Ear: Tympanic membrane and ear canal normal.     Left Ear: Tympanic membrane and ear canal normal.     Nose: Mucosal edema and rhinorrhea present.     Right Sinus: No maxillary sinus tenderness or frontal sinus tenderness.     Left Sinus: Maxillary sinus tenderness present. No frontal sinus tenderness.     Mouth/Throat:     Pharynx: Uvula midline. Posterior oropharyngeal erythema present. No oropharyngeal exudate.     Tonsils: Swelling: 0 on the right. 0 on the left.  Eyes:     General: No scleral icterus.    Conjunctiva/sclera: Conjunctivae normal.  Neck:     Musculoskeletal: Neck supple.  Cardiovascular:     Rate and Rhythm: Normal rate and regular rhythm.     Heart sounds: Normal heart sounds. No murmur.  Pulmonary:     Effort: Pulmonary effort is normal. No respiratory distress.     Breath sounds: Examination of the right-lower field reveals rhonchi and rales. Decreased breath sounds, wheezing, rhonchi and rales present.  Lymphadenopathy:     Cervical: No cervical adenopathy.  Skin:    General: Skin is warm and dry.     Capillary Refill: Capillary refill takes less than 2 seconds.  Neurological:  General: No focal deficit present.     Mental Status: She is alert.      CXR: increased airway thickening but no clear consolidation     Assessment & Plan:   Problem List Items Addressed This Visit      Other   Cough - Primary    Cough and wheezing now approaching 8+ weeks of illness. 2 courses of prednisone and 2 courses of abx per patient w/o improvement. Continues to smoke which is likely contributing to symptoms.       Relevant Medications   albuterol (PROVENTIL HFA;VENTOLIN HFA) 108 (90 Base) MCG/ACT inhaler   HYDROcodone-homatropine (HYCODAN) 5-1.5 MG/5ML syrup   Other Relevant Orders   DG Chest 2 View   Ambulatory referral to Pulmonology    Other Visit Diagnoses     Acute non-recurrent maxillary sinusitis       Relevant Medications   amoxicillin-clavulanate (AUGMENTIN) 875-125 MG tablet   HYDROcodone-homatropine (HYCODAN) 5-1.5 MG/5ML syrup      Some signs of sinus infection and little improvement, will trial another round of Abx. Discussed that if no improvement may need pulm referral vs returning to see [facility] for possible daily inhaler.    Return if symptoms worsen or fail to improve.  Lesleigh Noe, MD

## 2018-03-07 NOTE — Assessment & Plan Note (Signed)
Cough and wheezing now approaching 8+ weeks of illness. 2 courses of prednisone and 2 courses of abx per patient w/o improvement. Continues to smoke which is likely contributing to symptoms.

## 2018-03-07 NOTE — Telephone Encounter (Signed)
Will see at upcoming appt 

## 2018-03-07 NOTE — Patient Instructions (Signed)
Chest X-Ray did not show Pneumonia. I will follow-up the final read  For the possible sinus infection -- I prescribed Augmentin  Refill of the albuterol to help with breathing  Pulmonology referral if not improved

## 2018-03-08 ENCOUNTER — Ambulatory Visit: Payer: 59 | Admitting: Internal Medicine

## 2018-03-08 ENCOUNTER — Telehealth: Payer: Self-pay

## 2018-03-08 NOTE — Telephone Encounter (Signed)
Left message for patient to call back about Xray results

## 2018-03-08 NOTE — Telephone Encounter (Signed)
Pt advised.

## 2018-03-18 ENCOUNTER — Telehealth: Payer: Self-pay

## 2018-03-18 NOTE — Telephone Encounter (Signed)
OK - thanks

## 2018-03-18 NOTE — Telephone Encounter (Signed)
called patient and told patient that she should have enough trazodone until may and she said no that she was out. pt was give rx info and she states that she take 2 pill at bedtime and she is out. pt was told that even if she was [place] medication 2 times instead of the 1.5 she instructed to take she still should not be out.

## 2018-03-18 NOTE — Telephone Encounter (Signed)
pt called left message  that she needs a refill on her trazodone.   traZODone (DESYREL) 100 MG tablet  Medication  Date: 03/05/2018 Department: [facility] Ordering/Authorizing: Ursula Alert, MD  Order Providers   Prescribing Provider Encounter Provider  Ursula Alert, MD Ursula Alert, MD  Outpatient Medication Detail    Disp Refills Start End   traZODone (DESYREL) 100 MG tablet 135 tablet 0 03/05/2018    Sig - Route: Take 1.5 tablets (150 mg total) by mouth at bedtime. - Oral   Sent to pharmacy as: traZODone (DESYREL) 100 MG tablet   E-Prescribing Status: Receipt confirmed by pharmacy (03/05/2018 3:15 PM EST)

## 2018-04-01 ENCOUNTER — Ambulatory Visit: Payer: 59 | Admitting: Licensed Clinical Social Worker

## 2018-04-01 ENCOUNTER — Other Ambulatory Visit: Payer: Self-pay | Admitting: Student in an Organized Health Care Education/Training Program

## 2018-04-03 ENCOUNTER — Ambulatory Visit: Payer: 59 | Admitting: Child and Adolescent Psychiatry

## 2018-04-04 ENCOUNTER — Ambulatory Visit
Payer: 59 | Attending: Student in an Organized Health Care Education/Training Program | Admitting: Student in an Organized Health Care Education/Training Program

## 2018-04-04 ENCOUNTER — Other Ambulatory Visit: Payer: Self-pay

## 2018-04-04 ENCOUNTER — Encounter: Payer: Self-pay | Admitting: Student in an Organized Health Care Education/Training Program

## 2018-04-04 VITALS — BP 157/95 | HR 108 | Temp 98.5°F | Resp 18 | Ht 59.0 in | Wt 120.0 lb

## 2018-04-04 DIAGNOSIS — M7061 Trochanteric bursitis, right hip: Secondary | ICD-10-CM | POA: Diagnosis present

## 2018-04-04 DIAGNOSIS — M249 Joint derangement, unspecified: Secondary | ICD-10-CM | POA: Diagnosis not present

## 2018-04-04 DIAGNOSIS — G8929 Other chronic pain: Secondary | ICD-10-CM | POA: Diagnosis present

## 2018-04-04 DIAGNOSIS — G894 Chronic pain syndrome: Secondary | ICD-10-CM

## 2018-04-04 DIAGNOSIS — M533 Sacrococcygeal disorders, not elsewhere classified: Secondary | ICD-10-CM | POA: Diagnosis present

## 2018-04-04 MED ORDER — OXYCODONE-ACETAMINOPHEN 10-325 MG PO TABS: 1.0000 | ORAL_TABLET | Freq: Three times a day (TID) | ORAL | 0 refills | Status: AC | PRN

## 2018-04-04 MED ORDER — OXYCODONE-ACETAMINOPHEN 10-325 MG PO TABS: 1.0000 | ORAL_TABLET | Freq: Three times a day (TID) | ORAL | 0 refills | Status: DC | PRN

## 2018-04-04 NOTE — Progress Notes (Signed)
Nursing Pain Medication Assessment:  Safety precautions to be maintained throughout the outpatient stay will include: orient to surroundings, keep bed in low position, maintain call bell within reach at all times, provide assistance with transfer out of bed and ambulation.  Medication Inspection Compliance: Pill count conducted under aseptic conditions, in front of the patient. Neither the pills nor the bottle was removed from the patient's sight at any time. Once count was completed pills were immediately returned to the patient in their original bottle.  Medication: Oxycodone/APAP Pill/Patch Count: 20 of 90 pills remain Pill/Patch Appearance: Markings consistent with prescribed medication Bottle Appearance: Standard pharmacy container. Clearly labeled. Filled Date:03/04/ 2020 Last Medication intake:  Yesterday

## 2018-04-04 NOTE — Progress Notes (Signed)
Patient's Name: Tammy Lang  MRN: [id number]  Referring Provider: Jearld Fenton, NP  DOB: August 22, 1965  PCP: Jearld Fenton, NP  DOS: 04/04/2018  Note by: Gillis Santa, MD  Service setting: Ambulatory outpatient  Specialty: Interventional Pain Management  Location: [facility] (AMB) Pain Management Facility    Patient type: Established   Primary Reason(s) for Visit: Encounter for prescription drug management & post-procedure evaluation of chronic illness with mild to moderate exacerbation(Level of risk: moderate) CC: Back Pain (lower, middle)  HPI  Tammy Lang is a 53 y.o. year old, female patient, who comes today for a post-procedure evaluation and medication management. She has Spondylosis of cervical region without myelopathy or radiculopathy; Chronic pain syndrome; Bilateral carpal tunnel syndrome; Chronic pain of right elbow; Chronic left-sided low back pain with left-sided sciatica; Trochanteric bursitis of right hip; Derangement of right SI joint; Cough; and Chronic SI joint pain on their problem list. Her primarily concern today is the Back Pain (lower, middle)  Pain Assessment: Location: Mid, Lower Back Radiating: both hips, right upper leg Onset: More than a month ago Duration: Chronic pain Quality: Burning, Aching Severity: 8 /10 (subjective, self-reported pain score)  Note: Reported level is inconsistent with clinical observations.                         When using our objective Pain Scale, levels between 6 and 10/10 are said to belong in an emergency room, as it progressively worsens from a 6/10, described as severely limiting, requiring emergency care not usually available at an outpatient pain management facility. At a 6/10 level, communication becomes difficult and requires great effort. Assistance to reach the emergency department may be required. Facial flushing and profuse sweating along with potentially dangerous increases in heart rate and blood pressure will be  evident. Effect on ADL:   Timing: Constant Modifying factors: nothign BP: (!) 157/95  HR: (!) 108  Tammy Lang was last seen on 04/01/2018 for a procedure. During today's appointment we reviewed Tammy Lang's post-procedure results, as well as her outpatient medication regimen.  Patient is here for medication management.  She is continuing to endorse bilateral sacroiliac joint pain.  She states that the procedure provided her with minimal pain relief but she was undergoing a lot of stressors at that time as well.  She states that her sister right now is diagnosed with coded in [place].  This is providing her with great distress and anxiety.  We discussed drug holidays as the patient stated that her medications were becoming less effective.  I encouraged the patient to reduce her intake to no more than 2 tablets a day for 1 to 2 weeks to hopefully reset her mu receptors.  Further details on both, my assessment(s), as well as the proposed treatment plan, please see below.  Controlled Substance Pharmacotherapy Assessment REMS (Risk Evaluation and Mitigation Strategy)  Analgesic: Percocet 10 mg 3 times daily PRN, quantity 90/month MME/day: 45 mg/day.  [name], RN  04/04/2018  8:56 AM  Sign when Signing Visit Nursing Pain Medication Assessment:  Safety precautions to be maintained throughout the outpatient stay will include: orient to surroundings, keep bed in low position, maintain call bell within reach at all times, provide assistance with transfer out of bed and ambulation.  Medication Inspection Compliance: Pill count conducted under aseptic conditions, in front of the patient. Neither the pills nor the bottle was removed from the patient's sight at any time. Once  count was completed pills were immediately returned to the patient in their original bottle.  Medication: Oxycodone/APAP Pill/Patch Count: 20 of 90 pills remain Pill/Patch Appearance: Markings consistent with  prescribed medication Bottle Appearance: Standard pharmacy container. Clearly labeled. Filled Date:03/04/ 2020 Last Medication intake:  Yesterday   Pharmacokinetics: Liberation and absorption (onset of action): WNL Distribution (time to peak effect): WNL Metabolism and excretion (duration of action): WNL         Pharmacodynamics: Desired effects: Analgesia: Tammy Lang reports >50% benefit. Functional ability: Patient reports that medication allows her to accomplish basic ADLs Clinically meaningful improvement in function (CMIF): Sustained CMIF goals met Perceived effectiveness: Described as relatively effective, allowing for increase in activities of daily living (ADL) Undesirable effects: Side-effects or Adverse reactions: None reported Monitoring: [place] PMP: Online review of the past 26-monthperiod conducted. Compliant with practice rules and regulations Last UDS on record: Summary  Date Value Ref Range Status  11/27/2017 FINAL  Final    Comment:    ==================================================================== TOXASSURE SELECT 13 (MW) ==================================================================== Test                             Result       Flag       Units Drug Present   Oxycodone                      116                     ng/mg creat   Oxymorphone                    1356                    ng/mg creat   Noroxycodone                   996                     ng/mg creat   Noroxymorphone                 545                     ng/mg creat    Sources of oxycodone are scheduled prescription medications.    Oxymorphone, noroxycodone, and noroxymorphone are expected    metabolites of oxycodone. Oxymorphone is also available as a    scheduled prescription medication. ==================================================================== Test                      Result    Flag   Units      Ref Range   Creatinine              160              mg/dL       >=20 ==================================================================== Declared Medications:  Medication list was not provided. ==================================================================== For clinical consultation, please call [phone] ====================================================================    UDS interpretation: Compliant          Medication Assessment Form: Reviewed. Patient indicates being compliant with therapy Treatment compliance: Compliant Risk Assessment Profile: Aberrant behavior: See initial evaluations. None observed or detected today Comorbid factors increasing risk of overdose: See initial evaluation. No additional risks detected today Opioid risk tool (ORT):  Opioid Risk  01/24/2018  Alcohol 1  Illegal Drugs 2  Rx Drugs 0  Alcohol 0  Illegal Drugs 0  Rx Drugs 0  Age between 62-45 years  0  History of Preadolescent Sexual Abuse -  Psychological Disease 0  Depression 1  Opioid Risk Tool Scoring 4  Opioid Risk Interpretation Moderate Risk    ORT Scoring interpretation table:  Score <3 = Low Risk for SUD  Score between 4-7 = Moderate Risk for SUD  Score >8 = High Risk for Opioid Abuse   Risk of substance use disorder (SUD): Low  Risk Mitigation Strategies:  Patient Counseling: Covered Patient-Prescriber Agreement (PPA): Present and active  Notification to other healthcare providers: Done  Pharmacologic Plan: No change in therapy, at this time.             Post-Procedure Assessment  04/01/2018 Procedure: Bilateral sacroiliac joint injection Pre-procedure pain score:  8/10 Post-procedure pain score: 5/10         Influential Factors: BMI: 24.24 kg/m Intra-procedural challenges: None observed.         Assessment challenges: None detected.              Reported side-effects: None.        Post-procedural adverse reactions or complications: None reported         Sedation: Please see nurses note. When no sedatives are used, the  analgesic levels obtained are directly associated to the effectiveness of the local anesthetics. However, when sedation is provided, the level of analgesia obtained during the initial 1 hour following the intervention, is believed to be the result of a combination of factors. These factors may include, but are not limited to: 1. The effectiveness of the local anesthetics used. 2. The effects of the analgesic(s) and/or anxiolytic(s) used. 3. The degree of discomfort experienced by the patient at the time of the procedure. 4. The patients ability and reliability in recalling and recording the events. 5. The presence and influence of possible secondary gains and/or psychosocial factors. Reported result: Relief experienced during the 1st hour after the procedure: 100%(Ultra-Short Term Relief)            Interpretative annotation: Clinically appropriate result. Analgesia during this period is likely to be Local Anesthetic and/or IV Sedative (Analgesic/Anxiolytic) related.          Effects of local anesthetic: The analgesic effects attained during this period are directly associated to the localized infiltration of local anesthetics and therefore cary significant diagnostic value as to the etiological location, or anatomical origin, of the pain. Expected duration of relief is directly dependent on the pharmacodynamics of the local anesthetic used. Long-acting (4-6 hours) anesthetics used.  Reported result: Relief during the next 4 to 6 hour after the procedure: 50%Short-Term Relief)            Interpretative annotation: Clinically appropriate result. Analgesia during this period is likely to be Local Anesthetic-related.          Long-term benefit: Defined as the period of time past the expected duration of local anesthetics (1 hour for short-acting and 4-6 hours for long-acting). With the possible exception of prolonged sympathetic blockade from the local anesthetics, benefits during this period are typically  attributed to, or associated with, other factors such as analgesic sensory neuropraxia, antiinflammatory effects, or beneficial biochemical changes provided by agents other than the local anesthetics.  Reported result: Extended relief following procedure: 30%(Long-Term Relief)            Interpretative annotation: Clinically possible results. Good relief. No  permanent benefit expected. Inflammation plays a part in the etiology to the pain.          Current benefits: Defined as reported results that persistent at this point in time.   Analgesia: <25 %            Function: Somewhat improved ROM: Somewhat improved Interpretative annotation: Recurrence of symptoms. No permanent benefit expected. Results would suggest further treatment needed.          Interpretation: Results would suggest that repeating the procedure may be necessary,                  Plan:  Repeat treatment or therapy and compare extent and duration of benefits.                Laboratory Chemistry  Inflammation Markers (CRP: Acute Phase) (ESR: Chronic Phase) No results found for: CRP, ESRSEDRATE, LATICACIDVEN                       Rheumatology Markers No results found for: RF, ANA, LABURIC, URICUR, LYMEIGGIGMAB, LYMEABIGMQN, HLAB27                      Renal Function Markers Lab Results  Component Value Date   BUN 12 01/25/2018   CREATININE 0.85 16/10/9602   BCR NOT APPLICABLE 54/09/8117                             Hepatic Function Markers Lab Results  Component Value Date   AST 22 01/25/2018   ALT 24 01/25/2018   ALBUMIN 4.3 10/29/2017   ALKPHOS 67 10/29/2017                        Electrolytes Lab Results  Component Value Date   NA 140 01/25/2018   K 4.3 01/25/2018   CL 103 01/25/2018   CALCIUM 9.6 01/25/2018                        Neuropathy Markers No results found for: VITAMINB12, FOLATE, HGBA1C, HIV                      CNS Tests No results found for: COLORCSF, APPEARCSF, RBCCOUNTCSF, WBCCSF,  POLYSCSF, LYMPHSCSF, EOSCSF, PROTEINCSF, GLUCCSF, JCVIRUS, CSFOLI, IGGCSF                      Bone Pathology Markers Lab Results  Component Value Date   VD25OH 24 (L) 01/25/2018                         Coagulation Parameters Lab Results  Component Value Date   PLT 201.0 10/29/2017                        Cardiovascular Markers Lab Results  Component Value Date   HGB 13.3 10/29/2017   HCT 39.8 10/29/2017                         CA Markers No results found for: CEA, CA125, LABCA2                      Endocrine Markers No results found for: TSH, FREET4, TESTOFREE, TESTOSTERONE, ESTRADIOL, ESTRADIOLPCT, ESTRADIOLFRE  Note: Lab results reviewed.  Recent Diagnostic Imaging Results  DG Chest 2 View CLINICAL DATA:  Acute onset of cough, chills and wheezing.  EXAM: CHEST - 2 VIEW  COMPARISON:  02/04/2018.  FINDINGS: Cardiomediastinal silhouette unremarkable and unchanged. Mild central peribronchial thickening, unchanged since the examination 1 month ago. Lungs otherwise clear. No localized airspace consolidation. No pleural effusions. No pneumothorax. Normal pulmonary vascularity. Visualized bony thorax intact.  IMPRESSION: Stable mild changes of chronic bronchitis and/or asthma. No acute cardiopulmonary disease.  Electronically Signed   By: Evangeline Dakin M.D.   On: 03/08/2018 08:29  Complexity Note: Imaging results reviewed. Results shared with Tammy Lang, using Layman's terms.                               Meds   Current Outpatient Medications:  .  albuterol (PROVENTIL HFA;VENTOLIN HFA) 108 (90 Base) MCG/ACT inhaler, Inhale 2 puffs into the lungs every 6 (six) hours as needed for wheezing or shortness of breath., Disp: 1 Inhaler, Rfl: 1 .  ibuprofen (ADVIL,MOTRIN) 800 MG tablet, TAKE 1 TABLET BY MOUTH TWICE A DAY WITH FOOD AS NEEDED FOR MODERATE PAIN, Disp: 60 tablet, Rfl: 3 .  lidocaine (LIDODERM) 5 %, Place 1 patch onto the skin daily.  Remove & Discard patch within 12 hours or as directed by MD, Disp: 30 patch, Rfl: 2 .  Multiple Vitamin (MULTIVITAMIN WITH MINERALS) TABS tablet, Take 1 tablet by mouth daily., Disp: , Rfl:  .  Multiple Vitamins-Minerals (HAIR SKIN AND NAILS FORMULA) TABS, Take 1 tablet by mouth daily., Disp: , Rfl:  .  [START ON 04/12/2018] oxyCODONE-acetaminophen (PERCOCET) 10-325 MG tablet, Take 1 tablet by mouth every 8 (eight) hours as needed for up to 30 days for pain., Disp: 90 tablet, Rfl: 0 .  pravastatin (PRAVACHOL) 10 MG tablet, Take 1 tablet (10 mg total) by mouth at bedtime., Disp: 30 tablet, Rfl: 2 .  propranolol (INDERAL) 10 MG tablet, Take 1 tablet (10 mg total) by mouth 2 (two) times daily as needed. For severe anxiety, Disp: 60 tablet, Rfl: 1 .  traZODone (DESYREL) 100 MG tablet, Take 1.5 tablets (150 mg total) by mouth at bedtime., Disp: 135 tablet, Rfl: 0 .  venlafaxine XR (EFFEXOR XR) 75 MG 24 hr capsule, Take 1 capsule (75 mg total) by mouth daily with breakfast. Mood, Disp: 90 capsule, Rfl: 0 .  [START ON 05/12/2018] oxyCODONE-acetaminophen (PERCOCET) 10-325 MG tablet, Take 1 tablet by mouth every 8 (eight) hours as needed for up to 30 days for pain. Must last 30 days., Disp: 90 tablet, Rfl: 0 .  [START ON 06/11/2018] oxyCODONE-acetaminophen (PERCOCET) 10-325 MG tablet, Take 1 tablet by mouth every 8 (eight) hours as needed for up to 30 days for pain. Must last 30 days., Disp: 90 tablet, Rfl: 0  ROS  Constitutional: Denies any fever or chills Gastrointestinal: No reported hemesis, hematochezia, vomiting, or acute GI distress Musculoskeletal: Denies any acute onset joint swelling, redness, loss of ROM, or weakness Neurological: No reported episodes of acute onset apraxia, aphasia, dysarthria, agnosia, amnesia, paralysis, loss of coordination, or loss of consciousness  Allergies  Tammy Lang is allergic to duloxetine and vistaril [hydroxyzine hcl].  PFSH  Drug: Tammy Lang  reports no history of  drug use. Alcohol:  reports current alcohol use. Tobacco:  reports that she has been smoking cigarettes. She has a 7.50 pack-year smoking history. She has never used smokeless tobacco. Medical:  has  a past medical history of Allergy, Anxiety, Arthritis, Asthma, Lipoma of chest wall (03/23/2017), and Spinal stenosis of cervical region. Surgical: Tammy Lang  has a past surgical history that includes Cesarean section; Bunionectomy; Ganglion cyst excision; Breast surgery (Right); Dilation and curettage of uterus; and Mass excision (Left, 03/23/2017). Family: family history includes ADD / ADHD in her son; Alcohol abuse in her mother; Cervical cancer in her mother; Drug abuse in her son; Hodgkin's lymphoma in her sister.  Constitutional Exam  General appearance: Well nourished, well developed, and well hydrated. In no apparent acute distress Vitals:   04/04/18 0849  BP: (!) 157/95  Pulse: (!) 108  Resp: 18  Temp: 98.5 F (36.9 C)  TempSrc: Oral  SpO2: 97%  Weight: 120 lb (54.4 kg)  Height: 4' 11"  (1.499 m)   BMI Assessment: Estimated body mass index is 24.24 kg/m as calculated from the following:   Height as of this encounter: 4' 11"  (1.499 m).   Weight as of this encounter: 120 lb (54.4 kg).  BMI interpretation table: BMI level Category Range association with higher incidence of chronic pain  <18 kg/m2 Underweight   18.5-24.9 kg/m2 Ideal body weight   25-29.9 kg/m2 Overweight Increased incidence by 20%  30-34.9 kg/m2 Obese (Class I) Increased incidence by 68%  35-39.9 kg/m2 Severe obesity (Class II) Increased incidence by 136%  >40 kg/m2 Extreme obesity (Class III) Increased incidence by 254%   Patient's current BMI Ideal Body weight  Body mass index is 24.24 kg/m. Patient must be at least 60 in tall to calculate ideal body weight   BMI Readings from Last 4 Encounters:  04/04/18 24.24 kg/m  03/07/18 26.36 kg/m  02/04/18 26.05 kg/m  01/30/18 25.25 kg/m   Wt Readings from  Last 4 Encounters:  04/04/18 120 lb (54.4 kg)  03/07/18 130 lb 8 oz (59.2 kg)  02/04/18 129 lb (58.5 kg)  01/30/18 125 lb (56.7 kg)  Psych/Mental status: Alert, oriented x 3 (person, place, & time)       Eyes: PERLA Respiratory: No evidence of acute respiratory distress  Cervical Spine Area Exam  Skin & Axial Inspection: No masses, redness, edema, swelling, or associated skin lesions Alignment: Symmetrical Functional ROM: Unrestricted ROM      Stability: No instability detected Muscle Tone/Strength: Functionally intact. No obvious neuro-muscular anomalies detected. Sensory (Neurological): Unimpaired Palpation: No palpable anomalies              Upper Extremity (UE) Exam    Side: Right upper extremity  Side: Left upper extremity  Skin & Extremity Inspection: Skin color, temperature, and hair growth are WNL. No peripheral edema or cyanosis. No masses, redness, swelling, asymmetry, or associated skin lesions. No contractures.  Skin & Extremity Inspection: Skin color, temperature, and hair growth are WNL. No peripheral edema or cyanosis. No masses, redness, swelling, asymmetry, or associated skin lesions. No contractures.  Functional ROM: Unrestricted ROM          Functional ROM: Unrestricted ROM          Muscle Tone/Strength: Functionally intact. No obvious neuro-muscular anomalies detected.  Muscle Tone/Strength: Functionally intact. No obvious neuro-muscular anomalies detected.  Sensory (Neurological): Unimpaired          Sensory (Neurological): Unimpaired          Palpation: No palpable anomalies              Palpation: No palpable anomalies              Provocative Test(s):  Phalen's test: deferred Tinel's test: deferred Apley's scratch test (touch opposite shoulder):  Action 1 (Across chest): deferred Action 2 (Overhead): deferred Action 3 (LB reach): deferred   Provocative Test(s):  Phalen's test: deferred Tinel's test: deferred Apley's scratch test (touch opposite shoulder):   Action 1 (Across chest): deferred Action 2 (Overhead): deferred Action 3 (LB reach): deferred    Thoracic Spine Area Exam  Skin & Axial Inspection: No masses, redness, or swelling Alignment: Symmetrical Functional ROM: Unrestricted ROM Stability: No instability detected Muscle Tone/Strength: Functionally intact. No obvious neuro-muscular anomalies detected. Sensory (Neurological): Unimpaired Muscle strength & Tone: No palpable anomalies  Lumbar Spine Area Exam  Skin & Axial Inspection: No masses, redness, or swelling Alignment: Symmetrical Functional ROM: Unrestricted ROM       Stability: No instability detected Muscle Tone/Strength: Functionally intact. No obvious neuro-muscular anomalies detected. Sensory (Neurological): Unimpaired Palpation: No palpable anomalies       Provocative Tests: Hyperextension/rotation test: deferred today       Lumbar quadrant test (Kemp's test): deferred today       Lateral bending test: (+) due to pain. Patrick's Maneuver: (+) for bilateral S-I arthralgia             FABER* test: (+) for bilateral S-I arthralgia             S-I anterior distraction/compression test: (+) bilateral       S-I lateral compression test: deferred today         S-I Thigh-thrust test: deferred today         S-I Gaenslen's test: deferred today         *(Flexion, ABduction and External Rotation)  Gait & Posture Assessment  Ambulation: Unassisted Gait: Relatively normal for age and body habitus Posture: WNL   Lower Extremity Exam    Side: Right lower extremity  Side: Left lower extremity  Stability: No instability observed          Stability: No instability observed          Skin & Extremity Inspection: Skin color, temperature, and hair growth are WNL. No peripheral edema or cyanosis. No masses, redness, swelling, asymmetry, or associated skin lesions. No contractures.  Skin & Extremity Inspection: Skin color, temperature, and hair growth are WNL. No peripheral edema  or cyanosis. No masses, redness, swelling, asymmetry, or associated skin lesions. No contractures.  Functional ROM: Unrestricted ROM                  Functional ROM: Unrestricted ROM                  Muscle Tone/Strength: Functionally intact. No obvious neuro-muscular anomalies detected.  Muscle Tone/Strength: Functionally intact. No obvious neuro-muscular anomalies detected.  Sensory (Neurological): Unimpaired        Sensory (Neurological): Unimpaired        DTR: Patellar: deferred today Achilles: deferred today Plantar: deferred today  DTR: Patellar: deferred today Achilles: deferred today Plantar: deferred today  Palpation: No palpable anomalies  Palpation: No palpable anomalies   Assessment   Status Diagnosis  Having a Flare-up Having a Flare-up Controlled 1. Chronic SI joint pain   2. Derangement of right SI joint   3. Trochanteric bursitis of right hip   4. Chronic pain syndrome      Updated Problems: Problem  Chronic Si Joint Pain   Refill Percocet as below for 3 months.  No dose escalation.  Discussed drug holidays with patient to reset her mu receptors.  Encourage patient to limit her Percocet intake to no more than 2 tablets/day for the next 7 to 14 days which will hopefully decrease her tolerance.  I told the patient that we will not increase her medications from beyond this.  Given her persistent chronic SI joint pain, we discussed repeating sacroiliac joint block and trying to supplement with additional steroid to provide better analgesic effect.  Risks and benefits were discussed and patient would like to proceed.  Plan of Care  Pharmacotherapy (Medications Ordered): Meds ordered this encounter  Medications  . oxyCODONE-acetaminophen (PERCOCET) 10-325 MG tablet    Sig: Take 1 tablet by mouth every 8 (eight) hours as needed for up to 30 days for pain.    Dispense:  90 tablet    Refill:  0    Do not place this medication, or any other prescription from our  practice, on "Automatic Refill". Patient may have prescription filled one day early if pharmacy is closed on scheduled refill date.  Marland Kitchen oxyCODONE-acetaminophen (PERCOCET) 10-325 MG tablet    Sig: Take 1 tablet by mouth every 8 (eight) hours as needed for up to 30 days for pain. Must last 30 days.    Dispense:  90 tablet    Refill:  0    [place] STOP ACT - Not applicable. Fill one day early if pharmacy is closed on scheduled refill date.  Marland Kitchen oxyCODONE-acetaminophen (PERCOCET) 10-325 MG tablet    Sig: Take 1 tablet by mouth every 8 (eight) hours as needed for up to 30 days for pain. Must last 30 days.    Dispense:  90 tablet    Refill:  0    [place] STOP ACT - Not applicable. Fill one day early if pharmacy is closed on scheduled refill date.   Lab-work, procedure(s), and/or referral(s): Orders Placed This Encounter  Procedures  . SACROILIAC JOINT INJECTION   Time Note: Greater than 50% of the 25 minute(s) of face-to-face time spent with Tammy Lang, was spent in counseling/coordination of care regarding: the appropriate use of the pain scale, opioid tolerance, "Drug Holidays", Tammy Lang's primary cause of pain, the treatment plan, treatment alternatives, the risks and possible complications of proposed treatment, medication side effects, going over the informed consent, the opioid analgesic risks and possible complications, the results, interpretation and significance of  her recent diagnostic interventional treatment(s), the appropriate use of her medications, realistic expectations, the medication agreement and the patient's responsibilities when it comes to controlled substances.  Provider-requested follow-up: Return in about 3 months (around 07/05/2018) for Medication Management.  Future Appointments  Date Time Provider Omer  05/02/2018  3:00 PM Ursula Alert, MD [facility] None  05/07/2018  4:00 PM [facility]  07/04/2018  8:30 AM Gillis Santa, MD [facility] None     Primary Care Physician: Jearld Fenton, NP Location: [facility] Note by: Gillis Santa, M.D Date: 04/04/2018; Time: 9:30 AM  Patient Instructions  Repeat bilateral SI joint injection when able to Medication management 3 months____________________________________________________________________________________________  Preparing for Procedure with Sedation  Procedure appointments are limited to planned procedures: . No Prescription Refills. . No disability issues will be discussed. . No medication changes will be discussed.  Instructions: . Oral Intake: Do not eat or drink anything for at least 8 hours prior to your procedure. . Transportation: Public transportation is not allowed. Bring an adult driver. The driver must be physically present in our waiting room before any procedure can be started. Marland Kitchen Physical Assistance:  Bring an adult physically capable of assisting you, in the event you need help. This adult should keep you company at home for at least 6 hours after the procedure. . Blood Pressure Medicine: Take your blood pressure medicine with a sip of water the morning of the procedure. . Blood thinners: Notify our staff if you are taking any blood thinners. Depending on which one you take, there will be specific instructions on how and when to stop it. . Diabetics on insulin: Notify the staff so that you can be scheduled 1st case in the morning. If your diabetes requires high dose insulin, take only  of your normal insulin dose the morning of the procedure and notify the staff that you have done so. . Preventing infections: Shower with an antibacterial soap the morning of your procedure. . Build-up your immune system: Take 1000 mg of Vitamin C with every meal (3 times a day) the day prior to your procedure. Marland Kitchen Antibiotics: Inform the staff if you have a condition or reason that requires you to take antibiotics before dental procedures. . Pregnancy: If  you are pregnant, call and cancel the procedure. . Sickness: If you have a cold, fever, or any active infections, call and cancel the procedure. . Arrival: You must be in the facility at least 30 minutes prior to your scheduled procedure. . Children: Do not bring children with you. . Dress appropriately: Bring dark clothing that you would not mind if they get stained. . Valuables: Do not bring any jewelry or valuables.  Reasons to call and reschedule or cancel your procedure: (Following these recommendations will minimize the risk of a serious complication.) . Surgeries: Avoid having procedures within 2 weeks of any surgery. (Avoid for 2 weeks before or after any surgery). . Flu Shots: Avoid having procedures within 2 weeks of a flu shots or . (Avoid for 2 weeks before or after immunizations). . Barium: Avoid having a procedure within 7-10 days after having had a radiological study involving the use of radiological contrast. (Myelograms, Barium swallow or enema study). . Heart attacks: Avoid any elective procedures or surgeries for the initial 6 months after a "Myocardial Infarction" (Heart Attack). . Blood thinners: It is imperative that you stop these medications before procedures. Let us know if you if you take any blood thinner.  . Infection: Avoid procedures during or within two weeks of an infection (including chest colds or gastrointestinal problems). Symptoms associated with infections include: Localized redness, fever, chills, night sweats or profuse sweating, burning sensation when voiding, cough, congestion, stuffiness, runny nose, sore throat, diarrhea, nausea, vomiting, cold or Flu symptoms, recent or current infections. It is specially important if the infection is over the area that we intend to treat. Marland Kitchen Heart and lung problems: Symptoms that may suggest an active cardiopulmonary problem include: cough, chest pain, breathing difficulties or shortness of breath, dizziness, ankle swelling,  uncontrolled high or unusually low blood pressure, and/or palpitations. If you are experiencing any of these symptoms, cancel your procedure and contact your primary care physician for an evaluation.  Remember:  Regular Business hours are:  Monday to Thursday 8:00 AM to 4:00 PM  Provider's Schedule: Milinda Pointer, MD:  Procedure days: Tuesday and Thursday 7:30 AM to 4:00 PM  Gillis Santa, MD:  Procedure days: Monday and Wednesday 7:30 AM to 4:00 PM ____________________________________________________________________________________________

## 2018-04-04 NOTE — Patient Instructions (Addendum)
Repeat bilateral SI joint injection when able to Medication management 3 months____________________________________________________________________________________________  Preparing for Procedure with Sedation  Procedure appointments are limited to planned procedures: . No Prescription Refills. . No disability issues will be discussed. . No medication changes will be discussed.  Instructions: . Oral Intake: Do not eat or drink anything for at least 8 hours prior to your procedure. . Transportation: Public transportation is not allowed. Bring an adult driver. The driver must be physically present in our waiting room before any procedure can be started. Marland Kitchen Physical Assistance: Bring an adult physically capable of assisting you, in the event you need help. This adult should keep you company at home for at least 6 hours after the procedure. . Blood Pressure Medicine: Take your blood pressure medicine with a sip of water the morning of the procedure. . Blood thinners: Notify our staff if you are taking any blood thinners. Depending on which one you take, there will be specific instructions on how and when to stop it. . Diabetics on insulin: Notify the staff so that you can be scheduled 1st case in the morning. If your diabetes requires high dose insulin, take only  of your normal insulin dose the morning of the procedure and notify the staff that you have done so. . Preventing infections: Shower with an antibacterial soap the morning of your procedure. . Build-up your immune system: Take 1000 mg of Vitamin C with every meal (3 times a day) the day prior to your procedure. Marland Kitchen Antibiotics: Inform the staff if you have a condition or reason that requires you to take antibiotics before dental procedures. . Pregnancy: If you are pregnant, call and cancel the procedure. . Sickness: If you have a cold, fever, or any active infections, call and cancel the procedure. . Arrival: You must be in the facility at  least 30 minutes prior to your scheduled procedure. . Children: Do not bring children with you. . Dress appropriately: Bring dark clothing that you would not mind if they get stained. . Valuables: Do not bring any jewelry or valuables.  Reasons to call and reschedule or cancel your procedure: (Following these recommendations will minimize the risk of a serious complication.) . Surgeries: Avoid having procedures within 2 weeks of any surgery. (Avoid for 2 weeks before or after any surgery). . Flu Shots: Avoid having procedures within 2 weeks of a flu shots or . (Avoid for 2 weeks before or after immunizations). . Barium: Avoid having a procedure within 7-10 days after having had a radiological study involving the use of radiological contrast. (Myelograms, Barium swallow or enema study). . Heart attacks: Avoid any elective procedures or surgeries for the initial 6 months after a "Myocardial Infarction" (Heart Attack). . Blood thinners: It is imperative that you stop these medications before procedures. Let us know if you if you take any blood thinner.  . Infection: Avoid procedures during or within two weeks of an infection (including chest colds or gastrointestinal problems). Symptoms associated with infections include: Localized redness, fever, chills, night sweats or profuse sweating, burning sensation when voiding, cough, congestion, stuffiness, runny nose, sore throat, diarrhea, nausea, vomiting, cold or Flu symptoms, recent or current infections. It is specially important if the infection is over the area that we intend to treat. Marland Kitchen Heart and lung problems: Symptoms that may suggest an active cardiopulmonary problem include: cough, chest pain, breathing difficulties or shortness of breath, dizziness, ankle swelling, uncontrolled high or unusually low blood pressure, and/or palpitations. If you  are experiencing any of these symptoms, cancel your procedure and contact your primary care physician for an  evaluation.  Remember:  Regular Business hours are:  Monday to Thursday 8:00 AM to 4:00 PM  Provider's Schedule: Milinda Pointer, MD:  Procedure days: Tuesday and Thursday 7:30 AM to 4:00 PM  Gillis Santa, MD:  Procedure days: Monday and Wednesday 7:30 AM to 4:00 PM ____________________________________________________________________________________________

## 2018-04-19 ENCOUNTER — Ambulatory Visit (INDEPENDENT_AMBULATORY_CARE_PROVIDER_SITE_OTHER): Payer: 59 | Admitting: Licensed Clinical Social Worker

## 2018-04-19 ENCOUNTER — Other Ambulatory Visit: Payer: Self-pay

## 2018-04-19 DIAGNOSIS — F330 Major depressive disorder, recurrent, mild: Secondary | ICD-10-CM

## 2018-04-19 DIAGNOSIS — F411 Generalized anxiety disorder: Secondary | ICD-10-CM

## 2018-04-26 ENCOUNTER — Other Ambulatory Visit: Payer: Self-pay | Admitting: Internal Medicine

## 2018-04-29 ENCOUNTER — Ambulatory Visit: Payer: 59 | Admitting: Student in an Organized Health Care Education/Training Program

## 2018-05-02 ENCOUNTER — Other Ambulatory Visit: Payer: Self-pay

## 2018-05-02 ENCOUNTER — Ambulatory Visit: Payer: 59 | Admitting: Psychiatry

## 2018-05-03 ENCOUNTER — Ambulatory Visit: Payer: 59 | Admitting: Licensed Clinical Social Worker

## 2018-05-07 ENCOUNTER — Other Ambulatory Visit: Payer: Self-pay

## 2018-05-07 ENCOUNTER — Other Ambulatory Visit (INDEPENDENT_AMBULATORY_CARE_PROVIDER_SITE_OTHER): Payer: 59

## 2018-05-07 DIAGNOSIS — E559 Vitamin D deficiency, unspecified: Secondary | ICD-10-CM | POA: Diagnosis not present

## 2018-05-07 DIAGNOSIS — E782 Mixed hyperlipidemia: Secondary | ICD-10-CM | POA: Diagnosis not present

## 2018-05-08 LAB — LIPID PANEL
Cholesterol: 243 mg/dL — ABNORMAL HIGH (ref 0–200)
HDL: 73.1 mg/dL (ref >39.00)
LDL Cholesterol: 137 mg/dL — ABNORMAL HIGH (ref 0–99)
NonHDL: 169.78
Total CHOL/HDL Ratio: 3
Triglycerides: 166 mg/dL — ABNORMAL HIGH (ref 0.0–149.0)
VLDL: 33.2 mg/dL (ref 0.0–40.0)

## 2018-05-08 LAB — COMPREHENSIVE METABOLIC PANEL
ALT: 27 U/L (ref 0–35)
AST: 23 U/L (ref 0–37)
Albumin: 4.6 g/dL (ref 3.5–5.2)
Alkaline Phosphatase: 61 U/L (ref 39–117)
BUN: 7 mg/dL (ref 6–23)
CO2: 29 mEq/L (ref 19–32)
Calcium: 9.6 mg/dL (ref 8.4–10.5)
Chloride: 100 mEq/L (ref 96–112)
Creatinine, Ser: 0.81 mg/dL (ref 0.40–1.20)
GFR: 73.75 mL/min (ref >60.00)
Glucose, Bld: 84 mg/dL (ref 70–99)
Potassium: 4.1 mEq/L (ref 3.5–5.1)
Sodium: 138 mEq/L (ref 135–145)
Total Bilirubin: 0.4 mg/dL (ref 0.2–1.2)
Total Protein: 7.2 g/dL (ref 6.0–8.3)

## 2018-05-08 LAB — VITAMIN D 25 HYDROXY (VIT D DEFICIENCY, FRACTURES): VITD: 38.18 ng/mL (ref 30.00–100.00)

## 2018-05-10 ENCOUNTER — Other Ambulatory Visit: Payer: Self-pay | Admitting: Psychiatry

## 2018-05-10 DIAGNOSIS — F411 Generalized anxiety disorder: Secondary | ICD-10-CM

## 2018-05-19 ENCOUNTER — Other Ambulatory Visit: Payer: Self-pay | Admitting: Internal Medicine

## 2018-05-26 ENCOUNTER — Other Ambulatory Visit: Payer: Self-pay | Admitting: Psychiatry

## 2018-05-26 DIAGNOSIS — F411 Generalized anxiety disorder: Secondary | ICD-10-CM

## 2018-05-26 DIAGNOSIS — F330 Major depressive disorder, recurrent, mild: Secondary | ICD-10-CM

## 2018-05-31 NOTE — Progress Notes (Signed)
Virtual Visit via Telephone Note  I connected with Tammy Lang on 05/31/18 at  9:00 AM EDT by telephone and verified that I am speaking with the correct person using two identifiers.  Location: Patient: home Provider: office   I discussed the limitations, risks, security and privacy concerns of performing an evaluation and management service by telephone and the availability of in person appointments. I also discussed with the patient that there may be a patient responsible charge related to this service. The patient expressed understanding and agreed to proceed.     Participation Level: Active  Type of Therapy: Individual Therapy  Treatment Goals addressed: Anxiety  Interventions: CBT and Motivational Interviewing  Summary: Tammy Lang is a 53 y.o. female who presents with continued symptoms of diagnosis.  Therapist met with Patient in an initial therapy session to assess current mood and to build rapport. Therapist engaged Patient in discussion about her life and what is going well for her. Therapist provided support for Patient as she shared details about her life, her current stressors, mood, coping skills, her past, and her children. Therapist prompted Patient to discuss her support system and ways that she manages her daily stress, anger, and frustrations.    LCSW discussed what psychotherapy is and is not and the importance of the therapeutic relationship to include open and honest communication between client and therapist and building trust.  Reviewed advantages and disadvantages of the therapeutic process and limitations to the therapeutic relationship including LCSW's role in maintaining the safety of the client, others and those in client's care.    Suicidal/Homicidal: No  Plan: Return again in 2 weeks.  Diagnosis: Axis I: Generalized Anxiety Disorder    Axis II: No diagnosis      I discussed the assessment and treatment plan with the patient. The patient  was provided an opportunity to ask questions and all were answered. The patient agreed with the plan and demonstrated an understanding of the instructions.   The patient was advised to call back or seek an in-person evaluation if the symptoms worsen or if the condition fails to improve as anticipated.  I provided 30 minutes of non-face-to-face time during this encounter.   Lubertha South, LCSW

## 2018-06-03 ENCOUNTER — Other Ambulatory Visit: Payer: Self-pay | Admitting: Psychiatry

## 2018-06-03 DIAGNOSIS — F411 Generalized anxiety disorder: Secondary | ICD-10-CM

## 2018-06-05 ENCOUNTER — Telehealth: Payer: Self-pay

## 2018-06-05 ENCOUNTER — Ambulatory Visit (INDEPENDENT_AMBULATORY_CARE_PROVIDER_SITE_OTHER): Payer: 59 | Admitting: Family Medicine

## 2018-06-05 ENCOUNTER — Encounter: Payer: Self-pay | Admitting: Family Medicine

## 2018-06-05 ENCOUNTER — Telehealth: Payer: Self-pay | Admitting: Internal Medicine

## 2018-06-05 VITALS — Temp 98.4°F | Wt 120.0 lb

## 2018-06-05 DIAGNOSIS — J069 Acute upper respiratory infection, unspecified: Secondary | ICD-10-CM

## 2018-06-05 DIAGNOSIS — B9789 Other viral agents as the cause of diseases classified elsewhere: Secondary | ICD-10-CM | POA: Diagnosis not present

## 2018-06-05 DIAGNOSIS — R05 Cough: Secondary | ICD-10-CM

## 2018-06-05 DIAGNOSIS — R059 Cough, unspecified: Secondary | ICD-10-CM

## 2018-06-05 MED ORDER — BENZONATATE 100 MG PO CAPS: 100.0000 mg | ORAL_CAPSULE | Freq: Two times a day (BID) | ORAL | 0 refills | Status: DC | PRN

## 2018-06-05 MED ORDER — HYDROCODONE-HOMATROPINE 5-1.5 MG/5ML PO SYRP: 5.0000 mL | ORAL_SOLUTION | Freq: Three times a day (TID) | ORAL | 0 refills | Status: DC | PRN

## 2018-06-05 NOTE — Telephone Encounter (Signed)
Called and spoke with Cindy-nurse, they can see the order. Patient will be scheduled by [facility] for testing.

## 2018-06-05 NOTE — Progress Notes (Signed)
I connected with Tammy Lang on 06/05/18 at 11:00 AM EDT by video and verified that I am speaking with the correct person using two identifiers.   I discussed the limitations, risks, security and privacy concerns of performing an evaluation and management service by video and the availability of in person appointments. I also discussed with the patient that there may be a patient responsible charge related to this service. The patient expressed understanding and agreed to proceed.  Patient location: Home Provider Location: [facility] Participants: Lesleigh Noe and Tammy Lang   Subjective:     Tammy Lang is a 53 y.o. female presenting for Cough (Started 06/04/2018. Sneezing, ears are stopped up, sinus pain pressure, post nasap drip, headache. No fever. Feels like she has sinus infection again.)     Cough  This is a new problem. The current episode started yesterday. The cough is productive of purulent sputum and productive of sputum. Associated symptoms include chills, ear congestion, headaches, postnasal drip, a sore throat and wheezing. Pertinent negatives include no fever, hemoptysis, myalgias, nasal congestion, rhinorrhea, shortness of breath or sweats. Nothing aggravates the symptoms. Risk factors for lung disease include smoking/tobacco exposure. She has tried nothing for the symptoms. Her past medical history is significant for asthma and environmental allergies.   Tried albuterol inhaler one time yesterday and it made her throw up  Has a hx of allergies but not currently taking medication  Notes significant rib pain and discomfort from coughing  #Tobacco use - has been cutting back from cig over the last week  Review of Systems  Constitutional: Positive for chills. Negative for fever.  HENT: Positive for postnasal drip, sneezing and sore throat. Negative for rhinorrhea.   Respiratory: Positive for cough and wheezing. Negative for hemoptysis  and shortness of breath.   Musculoskeletal: Negative for myalgias.  Allergic/Immunologic: Positive for environmental allergies.  Neurological: Positive for headaches.     Social History   Tobacco Use  Smoking Status Former Smoker  . Packs/day: 0.25  . Years: 30.00  . Pack years: 7.50  . Types: Cigarettes  . Last attempt to quit: 06/04/2018  Smokeless Tobacco Never Used        Objective:   BP Readings from Last 3 Encounters:  04/04/18 (!) 157/95  03/07/18 126/74  02/04/18 130/82   Wt Readings from Last 3 Encounters:  06/05/18 120 lb (54.4 kg)  04/04/18 120 lb (54.4 kg)  03/07/18 130 lb 8 oz (59.2 kg)   Temp 98.4 F (36.9 C) Comment: per patient  Wt 120 lb (54.4 kg) Comment: per patient  LMP 02/12/2011   BMI 24.24 kg/m   Physical Exam Constitutional:      Appearance: Normal appearance. She is not ill-appearing.  HENT:     Head: Normocephalic and atraumatic.     Right Ear: External ear normal.     Left Ear: External ear normal.  Eyes:     Conjunctiva/sclera: Conjunctivae normal.  Pulmonary:     Effort: Pulmonary effort is normal. No respiratory distress.     Comments: Occasional cough Neurological:     Mental Status: She is alert. Mental status is at baseline.  Psychiatric:        Mood and Affect: Mood normal.        Behavior: Behavior normal.        Thought Content: Thought content normal.        Judgment: Judgment normal.  Assessment & Plan:   Problem List Items Addressed This Visit    None    Visit Diagnoses    Viral URI with cough    -  Primary   Relevant Medications   benzonatate (TESSALON) 100 MG capsule   HYDROcodone-homatropine (HYCODAN) 5-1.5 MG/5ML syrup     Pt works for [employer] - advised reaching out to health at work to see if she should be tested for Covid-19 with sneezing and hx of asthma, less likely. Discussed that I did not think she needed antibiotics and it was likely viral or allergy  Advised claritin and  flonase for post nasal drip and sneezing component  Congrats on cutting back on smoking!  Return if symptoms worsen or fail to improve.  Lesleigh Noe, MD

## 2018-06-05 NOTE — Telephone Encounter (Signed)
Received call from [facility].  Dr Waunita Schooner has placed order for patient to have COVID testing Call placed to patient. Left VM to return call to office. Call placed again. Left VM to call [phone] to schedule for testing.

## 2018-06-05 NOTE — Telephone Encounter (Signed)
Pt called stating an order needs to be done for covid19 testing This needs to be send to [facility]   [phone]  Once they receive order they will contact pt to schedule an appointment  Best number [phone]

## 2018-06-07 ENCOUNTER — Other Ambulatory Visit
Admission: RE | Admit: 2018-06-07 | Discharge: 2018-06-07 | Disposition: A | Discharge status: Home / Self Care | Payer: 59 | Source: Ambulatory Visit | Attending: Student in an Organized Health Care Education/Training Program | Admitting: Student in an Organized Health Care Education/Training Program

## 2018-06-07 ENCOUNTER — Other Ambulatory Visit: Payer: Self-pay

## 2018-06-07 DIAGNOSIS — Z1159 Encounter for screening for other viral diseases: Secondary | ICD-10-CM | POA: Diagnosis not present

## 2018-06-08 LAB — NOVEL CORONAVIRUS, NAA (HOSP ORDER, SEND-OUT TO REF LAB; TAT 18-24 HRS): SARS-CoV-2, NAA: NOT DETECTED

## 2018-06-10 ENCOUNTER — Other Ambulatory Visit: Payer: Self-pay | Admitting: Psychiatry

## 2018-06-10 DIAGNOSIS — F5105 Insomnia due to other mental disorder: Secondary | ICD-10-CM

## 2018-06-10 DIAGNOSIS — F411 Generalized anxiety disorder: Secondary | ICD-10-CM

## 2018-06-12 ENCOUNTER — Ambulatory Visit (HOSPITAL_BASED_OUTPATIENT_CLINIC_OR_DEPARTMENT_OTHER): Payer: 59 | Admitting: Student in an Organized Health Care Education/Training Program

## 2018-06-12 ENCOUNTER — Ambulatory Visit
Admission: RE | Admit: 2018-06-12 | Discharge: 2018-06-12 | Disposition: A | Discharge status: Home / Self Care | Payer: 59 | Source: Ambulatory Visit | Attending: Student in an Organized Health Care Education/Training Program | Admitting: Student in an Organized Health Care Education/Training Program

## 2018-06-12 ENCOUNTER — Telehealth: Payer: Self-pay | Admitting: *Deleted

## 2018-06-12 ENCOUNTER — Encounter: Payer: Self-pay | Admitting: Student in an Organized Health Care Education/Training Program

## 2018-06-12 ENCOUNTER — Other Ambulatory Visit: Payer: Self-pay

## 2018-06-12 VITALS — BP 122/90 | HR 80 | Temp 98.2°F | Resp 10 | Ht 59.0 in | Wt 123.0 lb

## 2018-06-12 DIAGNOSIS — G8929 Other chronic pain: Secondary | ICD-10-CM | POA: Diagnosis present

## 2018-06-12 DIAGNOSIS — M7061 Trochanteric bursitis, right hip: Secondary | ICD-10-CM

## 2018-06-12 DIAGNOSIS — G894 Chronic pain syndrome: Secondary | ICD-10-CM | POA: Diagnosis present

## 2018-06-12 DIAGNOSIS — Z01818 Encounter for other preprocedural examination: Secondary | ICD-10-CM | POA: Insufficient documentation

## 2018-06-12 DIAGNOSIS — M533 Sacrococcygeal disorders, not elsewhere classified: Secondary | ICD-10-CM

## 2018-06-12 MED ORDER — FENTANYL CITRATE (PF) 100 MCG/2ML IJ SOLN
INTRAMUSCULAR | Status: AC
  Filled 2018-06-12: qty 2

## 2018-06-12 MED ORDER — DEXAMETHASONE SODIUM PHOSPHATE 10 MG/ML IJ SOLN
INTRAMUSCULAR | Status: AC
  Filled 2018-06-12: qty 1

## 2018-06-12 MED ORDER — LIDOCAINE HCL 2 % IJ SOLN
INTRAMUSCULAR | Status: AC
  Filled 2018-06-12: qty 20

## 2018-06-12 MED ORDER — IBUPROFEN 800 MG PO TABS: 800.0000 mg | ORAL_TABLET | Freq: Every day | ORAL | 3 refills | Status: DC | PRN

## 2018-06-12 MED ORDER — DEXAMETHASONE SODIUM PHOSPHATE 10 MG/ML IJ SOLN
10.0000 mg | Freq: Once | INTRAMUSCULAR | Status: AC
  Administered 2018-06-12: 10 mg

## 2018-06-12 MED ORDER — ROPIVACAINE HCL 2 MG/ML IJ SOLN
INTRAMUSCULAR | Status: AC
  Filled 2018-06-12: qty 10

## 2018-06-12 MED ORDER — LIDOCAINE HCL 2 % IJ SOLN
20.0000 mL | Freq: Once | INTRAMUSCULAR | Status: AC
  Administered 2018-06-12: 400 mg

## 2018-06-12 MED ORDER — ROPIVACAINE HCL 2 MG/ML IJ SOLN
9.0000 mL | Freq: Once | INTRAMUSCULAR | Status: AC
  Administered 2018-06-12: 10 mL via INTRA_ARTICULAR

## 2018-06-12 MED ORDER — FENTANYL CITRATE (PF) 100 MCG/2ML IJ SOLN
25.0000 ug | INTRAMUSCULAR | Status: DC | PRN
  Administered 2018-06-12: 50 ug via INTRAVENOUS

## 2018-06-12 NOTE — Patient Instructions (Signed)

## 2018-06-12 NOTE — Telephone Encounter (Signed)
Patient notified that work note for today is at the [employer] of Staff.

## 2018-06-12 NOTE — Progress Notes (Signed)
Patient's Name: Tammy Lang  MRN: [id number]  Referring Provider: Jearld Fenton, NP  DOB: May 09, 1965  PCP: Jearld Fenton, NP  DOS: 06/12/2018  Note by: Gillis Santa, MD  Service setting: Ambulatory outpatient  Specialty: Interventional Pain Management  Patient type: Established  Location: [facility] (AMB) Pain Management Facility  Visit type: Interventional Procedure   Primary Reason for Visit: Interventional Pain Management Treatment. CC: Back Pain (low left)  Procedure:          Anesthesia, Analgesia, Anxiolysis:  Type: Therapeutic Sacroiliac Joint Steroid Injection          Region: Inferior Lumbosacral Region Level: PIIS (Posterior Inferior Iliac Spine) Laterality: Bilateral  Type: Moderate (Conscious) Sedation combined with Local Anesthesia Indication(s): Analgesia and Anxiety Route: Intravenous (IV) IV Access: Secured Sedation: Meaningful verbal contact was maintained at all times during the procedure  Local Anesthetic: Lidocaine 1-2%  Position: Prone           Indications: 1. Chronic SI joint pain   2. Preoperative testing   3. Trochanteric bursitis of right hip   4. Chronic pain syndrome    Pain Score: Pre-procedure: 10-Worst pain ever/10 Post-procedure: 8 /10  Pre-op Assessment:  Tammy Lang is a 53 y.o. (year old), female patient, seen today for interventional treatment. She  has a past surgical history that includes Cesarean section; Bunionectomy; Ganglion cyst excision; Breast surgery (Right); Dilation and curettage of uterus; and Mass excision (Left, 03/23/2017). Tammy Lang has a current medication list which includes the following prescription(s): albuterol, benzonatate, hydrocodone-homatropine, ibuprofen, lidocaine, multivitamin with minerals, hair skin and nails formula, oxycodone-acetaminophen, pravastatin, propranolol, trazodone, and venlafaxine xr, and the following Facility-Administered Medications: fentanyl. Her primarily concern today is the Back Pain (low  left)  Initial Vital Signs:  Pulse/HCG Rate: 96ECG Heart Rate: 77 Temp: 98.4 F (36.9 C) Resp: 16 BP: 127/81 SpO2: 98 %  BMI: Estimated body mass index is 24.84 kg/m as calculated from the following:   Height as of this encounter: 4\' 11"  (1.499 m).   Weight as of this encounter: 123 lb (55.8 kg).  Risk Assessment: Allergies: Reviewed. She is allergic to duloxetine and vistaril [hydroxyzine hcl].  Allergy Precautions: None required Coagulopathies: Reviewed. None identified.  Blood-thinner therapy: None at this time Active Infection(s): Reviewed. None identified. Tammy Lang is afebrile  Site Confirmation: Tammy Lang was asked to confirm the procedure and laterality before marking the site Procedure checklist: Completed Consent: Before the procedure and under the influence of no sedative(s), amnesic(s), or anxiolytics, the patient was informed of the treatment options, risks and possible complications. To fulfill our ethical and legal obligations, as recommended by the American Medical Association's Code of Ethics, I have informed the patient of my clinical impression; the nature and purpose of the treatment or procedure; the risks, benefits, and possible complications of the intervention; the alternatives, including doing nothing; the risk(s) and benefit(s) of the alternative treatment(s) or procedure(s); and the risk(s) and benefit(s) of doing nothing. The patient was provided information about the general risks and possible complications associated with the procedure. These may include, but are not limited to: failure to achieve desired goals, infection, bleeding, organ or nerve damage, allergic reactions, paralysis, and death. In addition, the patient was informed of those risks and complications associated to the procedure, such as failure to decrease pain; infection; bleeding; organ or nerve damage with subsequent damage to sensory, motor, and/or autonomic systems, resulting in  permanent pain, numbness, and/or weakness of one or several areas of the body;  allergic reactions; (i.e.: anaphylactic reaction); and/or death. Furthermore, the patient was informed of those risks and complications associated with the medications. These include, but are not limited to: allergic reactions (i.e.: anaphylactic or anaphylactoid reaction(s)); adrenal axis suppression; blood sugar elevation that in diabetics may result in ketoacidosis or comma; water retention that in patients with history of congestive heart failure may result in shortness of breath, pulmonary edema, and decompensation with resultant heart failure; weight gain; swelling or edema; medication-induced neural toxicity; particulate matter embolism and blood vessel occlusion with resultant organ, and/or nervous system infarction; and/or aseptic necrosis of one or more joints. Finally, the patient was informed that Medicine is not an exact science; therefore, there is also the possibility of unforeseen or unpredictable risks and/or possible complications that may result in a catastrophic outcome. The patient indicated having understood very clearly. We have given the patient no guarantees and we have made no promises. Enough time was given to the patient to ask questions, all of which were answered to the patient's satisfaction. Tammy Lang has indicated that she wanted to continue with the procedure. Attestation: I, the ordering provider, attest that I have discussed with the patient the benefits, risks, side-effects, alternatives, likelihood of achieving goals, and potential problems during recovery for the procedure that I have provided informed consent. Date  Time: 06/12/2018  8:54 AM  Pre-Procedure Preparation:  Monitoring: As per clinic protocol. Respiration, ETCO2, SpO2, BP, heart rate and rhythm monitor placed and checked for adequate function Safety Precautions: Patient was assessed for positional comfort and pressure points  before starting the procedure. Time-out: I initiated and conducted the "Time-out" before starting the procedure, as per protocol. The patient was asked to participate by confirming the accuracy of the "Time Out" information. Verification of the correct person, site, and procedure were performed and confirmed by me, the nursing staff, and the patient. "Time-out" conducted as per Joint Commission's Universal Protocol (UP.01.01.01). Time: 0935  Description of Procedure:          Target Area: Inferior, posterior, aspect of the sacroiliac fissure Approach: Posterior, paraspinal, ipsilateral approach. Area Prepped: Entire Lower Lumbosacral Region Prepping solution: 56M DuraPrep (Iodine Povacrylex [0.7% available iodine] and Isopropyl Alcohol, 74% w/w) Safety Precautions: Aspiration looking for blood return was conducted prior to all injections. At no point did we inject any substances, as a needle was being advanced. No attempts were made at seeking any paresthesias. Safe injection practices and needle disposal techniques used. Medications properly checked for expiration dates. SDV (single dose vial) medications used. Description of the Procedure: Protocol guidelines were followed. The patient was placed in position over the procedure table. The target area was identified and the area prepped in the usual manner. Skin & deeper tissues infiltrated with local anesthetic. Appropriate amount of time allowed to pass for local anesthetics to take effect. The procedure needle was advanced under fluoroscopic guidance into the sacroiliac joint until a firm endpoint was obtained. Proper needle placement secured. Negative aspiration confirmed. Solution injected in intermittent fashion, asking for systemic symptoms every 0.5cc of injectate. The needles were then removed and the area cleansed, making sure to leave some of the prepping solution back to take advantage of its long term bactericidal properties. Vitals:    06/12/18 0945 06/12/18 0953 06/12/18 1004 06/12/18 1013  BP: (!) 143/80 133/85 (!) 146/79 122/90  Pulse: 80     Resp: 10 16 (!) 23 10  Temp:  98.2 F (36.8 C)  98.2 F (36.8 C)  SpO2: 98%  100% 100% 100%  Weight:      Height:        Start Time: 0935 hrs. End Time: 0943 hrs. Materials:  Needle(s) Type: Spinal Needle Gauge: 22G Length: 3.5-in Medication(s): Please see orders for medications and dosing details. 10 cc solution made of 9 cc of 0.2% ropivacaine, 1 cc of Decadron 10 mg/cc. 2.5 cc injected intra-articular left SI joint, 2.5 cc injected periarticular left SI joint 2.5 cc injected intra-articular right SI joint, 2.5 cc injected periarticular right SI joint  Total steroid dose equals 10 mg Decadron Imaging Guidance (Non-Spinal):          Type of Imaging Technique: Fluoroscopy Guidance (Non-Spinal) Indication(s): Assistance in needle guidance and placement for procedures requiring needle placement in or near specific anatomical locations not easily accessible without such assistance. Exposure Time: Please see nurses notes. Contrast: Before injecting any contrast, we confirmed that the patient did not have an allergy to iodine, shellfish, or radiological contrast. Once satisfactory needle placement was completed at the desired level, radiological contrast was injected. Contrast injected under live fluoroscopy. No contrast complications. See chart for type and volume of contrast used. Fluoroscopic Guidance: I was personally present during the use of fluoroscopy. "Tunnel Vision Technique" used to obtain the best possible view of the target area. Parallax error corrected before commencing the procedure. "Direction-depth-direction" technique used to introduce the needle under continuous pulsed fluoroscopy. Once target was reached, antero-posterior, oblique, and lateral fluoroscopic projection used confirm needle placement in all planes. Images permanently stored in EMR. Interpretation: I  personally interpreted the imaging intraoperatively. Adequate needle placement confirmed in multiple planes. Appropriate spread of contrast into desired area was observed. No evidence of afferent or efferent intravascular uptake. Permanent images saved into the patient's record.  Antibiotic Prophylaxis:   Anti-infectives (From admission, onward)   None     Indication(s): None identified  Post-operative Assessment:  Post-procedure Vital Signs:  Pulse/HCG Rate: 8085 Temp: 98.2 F (36.8 C) Resp: 10 BP: 122/90 SpO2: 100 %  EBL: None  Complications: No immediate post-treatment complications observed by team, or reported by patient.  Note: The patient tolerated the entire procedure well. A repeat set of vitals were taken after the procedure and the patient was kept under observation following institutional policy, for this type of procedure. Post-procedural neurological assessment was performed, showing return to baseline, prior to discharge. The patient was provided with post-procedure discharge instructions, including a section on how to identify potential problems. Should any problems arise concerning this procedure, the patient was given instructions to immediately contact us, at any time, without hesitation. In any case, we plan to contact the patient by telephone for a follow-up status report regarding this interventional procedure.  Comments:  No additional relevant information.  Plan of Care  Orders:  Orders Placed This Encounter  Procedures  . Novel Coronavirus, NAA ([facility])    Send patient to pre-admission testing for collection. Estimated turn-around time: 72 hrs.    Standing Status:   Future    Standing Expiration Date:   08/06/2018  . [facility] C-ARM 1-60 MIN NO REPORT    Intraoperative interpretation by procedural physician at [facility].    Standing Status:   Standing    Number of Occurrences:   1    Order Specific Question:   Reason for exam:     Answer:   Assistance in needle guidance and placement for procedures requiring needle placement in or near specific anatomical locations not easily accessible without  such assistance.   Medications ordered for procedure: Meds ordered this encounter  Medications  . lidocaine (XYLOCAINE) 2 % (with pres) injection 400 mg  . fentaNYL (SUBLIMAZE) injection 25-50 mcg    Make sure Narcan is available in the pyxis when using this medication. In the event of respiratory depression (RR< 8/min): Titrate NARCAN (naloxone) in increments of 0.1 to 0.2 mg IV at 2-3 minute intervals, until desired degree of reversal.  . ropivacaine (PF) 2 mg/mL (0.2%) (NAROPIN) injection 9 mL  . dexamethasone (DECADRON) injection 10 mg  . ibuprofen (ADVIL) 800 MG tablet    Sig: Take 1 tablet (800 mg total) by mouth daily as needed for moderate pain.    Dispense:  30 tablet    Refill:  3   Medications administered: We administered lidocaine, fentaNYL, ropivacaine (PF) 2 mg/mL (0.2%), and dexamethasone.  See the medical record for exact dosing, route, and time of administration.  Disposition: Discharge home  Discharge Date & Time: 06/12/2018; 1014 hrs.   Follow-up plan:   Return in about 4 weeks (around 07/10/2018) for Medication Management, Post Procedure Evaluation.     Future Appointments  Date Time Provider [place]  07/10/2018  1:30 PM Gillis Santa, MD [facility] None   Primary Care Physician: Jearld Fenton, NP Location: [facility] Out[facility] Management Facility Note by: Gillis Santa, MD Date: 06/12/2018; Time: 10:26 AM  Disclaimer:  Medicine is not an exact science. The only guarantee in medicine is that nothing is guaranteed. It is important to note that the decision to proceed with this intervention was based on the information collected from the patient. The Data and conclusions were drawn from the patient's questionnaire, the interview, and the physical examination. Because the information was provided  in large part by the patient, it cannot be guaranteed that it has not been purposely or unconsciously manipulated. Every effort has been made to obtain as much relevant data as possible for this evaluation. It is important to note that the conclusions that lead to this procedure are derived in large part from the available data. Always take into account that the treatment will also be dependent on availability of resources and existing treatment guidelines, considered by other Pain Management Practitioners as being common knowledge and practice, at the time of the intervention. For Medico-Legal purposes, it is also important to point out that variation in procedural techniques and pharmacological choices are the acceptable norm. The indications, contraindications, technique, and results of the above procedure should only be interpreted and judged by a Board-Certified Interventional Pain Specialist with extensive familiarity and expertise in the same exact procedure and technique.

## 2018-06-12 NOTE — Progress Notes (Signed)
Safety precautions to be maintained throughout the outpatient stay will include: orient to surroundings, keep bed in low position, maintain call bell within reach at all times, provide assistance with transfer out of bed and ambulation.  

## 2018-06-14 ENCOUNTER — Telehealth: Payer: Self-pay | Admitting: *Deleted

## 2018-06-14 NOTE — Telephone Encounter (Signed)
No problems post procedure. 

## 2018-06-18 ENCOUNTER — Ambulatory Visit (INDEPENDENT_AMBULATORY_CARE_PROVIDER_SITE_OTHER): Payer: 59 | Admitting: Internal Medicine

## 2018-06-18 ENCOUNTER — Encounter: Payer: Self-pay | Admitting: Internal Medicine

## 2018-06-18 DIAGNOSIS — B9789 Other viral agents as the cause of diseases classified elsewhere: Secondary | ICD-10-CM | POA: Diagnosis not present

## 2018-06-18 DIAGNOSIS — J329 Chronic sinusitis, unspecified: Secondary | ICD-10-CM | POA: Diagnosis not present

## 2018-06-18 MED ORDER — PREDNISONE 10 MG PO TABS: ORAL_TABLET | ORAL | 0 refills | Status: DC

## 2018-06-18 NOTE — Patient Instructions (Signed)

## 2018-06-18 NOTE — Progress Notes (Signed)
Virtual Visit via Video Note  I connected with Tammy Lang on 06/18/18 at 12:00 PM EDT by a video enabled telemedicine application and verified that I am speaking with the correct person using two identifiers.  Location: Patient: Home Provider: Office   I discussed the limitations of evaluation and management by telemedicine and the availability of in person appointments. The patient expressed understanding and agreed to proceed.  History of Present Illness:    HPI  Pt presents to the clinic today with c/o headache, nasal congestion, sore throat and cough. She reports this started 1.5 weeks ago. The headache is located in her forehead. She describes the pain as pressure. She denies dizziness or visual changes. She is blowing clear mucous out of her nose. She has been sneezing. She denies difficulty swallowing. The cough is productive of white mucous. She denies fever, chills, body aches or SOB. She saw Dr. Einar Pheasant for the same on 5/27, diagnosed with a viral uri. Recommend antihistamine and nasal spray which she has been doing with minimal relief. She has not had sick contacts that she is aware of. She does not smoke.  Review of Systems     Past Medical History:  Diagnosis Date  . Allergy   . Anxiety   . Arthritis   . Asthma   . Lipoma of chest wall 03/23/2017  . Spinal stenosis of cervical region     Family History  Problem Relation Age of Onset  . Alcohol abuse Mother   . Cervical cancer Mother   . Hodgkin's lymphoma Sister   . ADD / ADHD Son   . Drug abuse Son     Social History   Socioeconomic History  . Marital status: Married    Spouse name: vincent  . Number of children: 2  . Years of education: Not on file  . Highest education level: High school graduate  Occupational History  . Not on file  Social Needs  . Financial resource strain: Not hard at all  . Food insecurity:    Worry: Never true    Inability: Never true  . Transportation needs:    Medical:  No    Non-medical: No  Tobacco Use  . Smoking status: Current Every Day Smoker    Packs/day: 0.25    Years: 30.00    Pack years: 7.50    Types: Cigarettes    Last attempt to quit: 06/04/2018    Years since quitting: 0.0  . Smokeless tobacco: Never Used  Substance and Sexual Activity  . Alcohol use: Yes    Comment: occasional  . Drug use: No  . Sexual activity: Yes    Birth control/protection: Surgical  Lifestyle  . Physical activity:    Days per week: 0 days    Minutes per session: 0 min  . Stress: Very much  Relationships  . Social connections:    Talks on phone: Not on file    Gets together: Not on file    Attends religious service: Never    Active member of club or organization: No    Attends meetings of clubs or organizations: Never    Relationship status: Married  . Intimate partner violence:    Fear of current or ex partner: No    Emotionally abused: No    Physically abused: No    Forced sexual activity: No  Other Topics Concern  . Not on file  Social History Narrative  . Not on file    Allergies  Allergen Reactions  .  Duloxetine     Raises BP  . Vistaril [Hydroxyzine Hcl]     Hands go numb     Constitutional: Positive headache,. Denies fatigue, fever or abrupt weight changes.  HEENT:  Positive nasal congestion and sore throat. Denies eye redness, ear pain, ringing in the ears, wax buildup, runny nose or bloody nose. Respiratory: Positive cough. Denies difficulty breathing or shortness of breath.  Cardiovascular: Denies chest pain, chest tightness, palpitations or swelling in the hands or feet.   No other specific complaints in a complete review of systems (except as listed in HPI above).  Objective:   General: Appears her stated age, in NAD. HEENT: Head: normal shape and size; Eyes: sclera white;  Pulmonary/Chest: Normal effort. No respiratory distress.       Assessment & Plan:   Viral Sinusitis  Can use a Neti Pot which can be purchased from  your local drug store. Flonase 2 sprays each nostril for 3 days and then as needed. Continue Claritin OTC RX for Pred Taper x 6 days If no improvement by Friday, will send in eRx for Augmentin BID for 10 days  RTC as needed or if symptoms persist. Webb Silversmith, NP   Follow Up Instructions:    I discussed the assessment and treatment plan with the patient. The patient was provided an opportunity to ask questions and all were answered. The patient agreed with the plan and demonstrated an understanding of the instructions.   The patient was advised to call back or seek an in-person evaluation if the symptoms worsen or if the condition fails to improve as anticipated.

## 2018-06-21 ENCOUNTER — Telehealth: Payer: Self-pay | Admitting: *Deleted

## 2018-06-21 MED ORDER — AMOXICILLIN-POT CLAVULANATE 875-125 MG PO TABS: 1.0000 | ORAL_TABLET | Freq: Two times a day (BID) | ORAL | 0 refills | Status: DC

## 2018-06-21 NOTE — Telephone Encounter (Signed)
augmentin sent to [facility]

## 2018-06-21 NOTE — Addendum Note (Signed)
Addended by: Jearld Fenton on: 06/21/2018 01:08 PM   Modules accepted: Orders

## 2018-06-21 NOTE — Telephone Encounter (Signed)
Patient called stating that she was told to call back today if she was not better. Patient stated that she has chest, head congestion and throat is scratch. Patient stated that she wants an antibiotic, but make sure it is not a Zpack because they do not work. Patient stated that she is not running a fever. Pharmacy [facility]

## 2018-06-24 NOTE — Telephone Encounter (Signed)
Pt is aware.  

## 2018-06-27 ENCOUNTER — Other Ambulatory Visit: Payer: Self-pay | Admitting: Psychiatry

## 2018-06-27 DIAGNOSIS — F411 Generalized anxiety disorder: Secondary | ICD-10-CM

## 2018-07-04 ENCOUNTER — Telehealth: Payer: Self-pay | Admitting: *Deleted

## 2018-07-04 ENCOUNTER — Encounter: Payer: 59 | Admitting: Student in an Organized Health Care Education/Training Program

## 2018-07-04 NOTE — Telephone Encounter (Signed)
Set up doxy.me. May need xray, vs COVID testing.

## 2018-07-04 NOTE — Telephone Encounter (Signed)
Pt called triage line. She said she saw Webb Silversmith, NP on 06/18/18, pt stated that her sxs never improved. Pt said that she did finish the meds Rollene Fare gave her but she is still feeling really bad. Pt said she had a tem on 99.3 yesterday and 99.1 today. Pt said she is still coughing, with congestion and chest congestion. Pt is also fatigued and sneezing often. Pt said she isn't SOB but she does have asthma and has had her usual flare up. Pt said she isn't blowing out anything from her nose but it feels like all the congestion is in her chest and she can't cough it up, pt ask what should she do.

## 2018-07-04 NOTE — Telephone Encounter (Signed)
Appointment 6/26 with Tammy Lang

## 2018-07-05 ENCOUNTER — Encounter: Payer: Self-pay | Admitting: Family Medicine

## 2018-07-05 ENCOUNTER — Ambulatory Visit (INDEPENDENT_AMBULATORY_CARE_PROVIDER_SITE_OTHER): Payer: 59 | Admitting: Family Medicine

## 2018-07-05 DIAGNOSIS — J069 Acute upper respiratory infection, unspecified: Secondary | ICD-10-CM | POA: Diagnosis not present

## 2018-07-05 MED ORDER — MOMETASONE FUROATE 50 MCG/ACT NA SUSP: 2.0000 | Freq: Every day | NASAL | 12 refills | Status: AC

## 2018-07-05 MED ORDER — BENZONATATE 100 MG PO CAPS: 100.0000 mg | ORAL_CAPSULE | Freq: Three times a day (TID) | ORAL | 0 refills | Status: AC | PRN

## 2018-07-05 MED ORDER — GUAIFENESIN-CODEINE 100-10 MG/5ML PO SYRP: 5.0000 mL | ORAL_SOLUTION | Freq: Every evening | ORAL | 0 refills | Status: AC | PRN

## 2018-07-05 NOTE — Progress Notes (Signed)
Virtual Visit via Video Note  I connected with Tammy Lang on 07/05/18 at 10:15 AM EDT by a video enabled telemedicine application and verified that I am speaking with the correct person using two identifiers.  Location: Patient: In her home Provider: [place]   I discussed the limitations of evaluation and management by telemedicine and the availability of in person appointments. The patient expressed understanding and agreed to proceed.  History of Present Illness: This is a 53 year old female who requests virtual visit today to discuss continued URI symptoms.  06/05/2018- she had a virtual visit with Dr. Einar Pheasant and diagnosed with viral URI, recommended that she use antihistamine and nasal spray. 06/18/2018- she had a virtual visit with her PCP Golden Hurter and was given prednisone taper.  She was also prescribed Flonase and suggested that she use Nettie pot and continue antihistamine. 06/21/2018- she called back to the office without improvement of symptoms and Augmentin 10-day course was sent in.  Today she reports that symptoms have been waxing and waning. She has not seen definitive improvement with any of the above. She is currently having chest congestion with clear sputum which feels "stuck." She has had a temp to 99.3. She has some pounding over her eyes for which she has taken ibuprofen 800 mg qd with little relief. She is not currently taking any mucolytic or cough suppressant. She is having clear nasal drainage with post nasal drainage. She is taking daily antihistamine. She stopped fluticasone because it burned her nose. She is hearing some wheezing but no relief with albuterol inhaler.  She works at [employer]. She had recent Covid testing pre procedure and is scheduled to be tested again prior to another orthopedic procedure. She does not want to have additional testing.  Past Medical History:  Diagnosis Date  . Allergy   . Anxiety   . Arthritis   . Asthma   . Lipoma of  chest wall 03/23/2017  . Spinal stenosis of cervical region    Past Surgical History:  Procedure Laterality Date  . BREAST SURGERY Right    benign tumor  . BUNIONECTOMY    . CESAREAN SECTION    . DILATION AND CURETTAGE OF UTERUS    . GANGLION CYST EXCISION    . MASS EXCISION Left 03/23/2017   Procedure: EXCISION OF LEFT CHEST WALL MASS;  Surgeon: Fanny Skates, MD;  Location: [facility];  Service: General;  Laterality: Left;   Family History  Problem Relation Age of Onset  . Alcohol abuse Mother   . Cervical cancer Mother   . Hodgkin's lymphoma Sister   . ADD / ADHD Son   . Drug abuse Son    Social History   Tobacco Use  . Smoking status: Current Every Day Smoker    Packs/day: 0.25    Years: 30.00    Pack years: 7.50    Types: Cigarettes    Last attempt to quit: 06/04/2018    Years since quitting: 0.0  . Smokeless tobacco: Never Used  Substance Use Topics  . Alcohol use: Yes    Comment: occasional  . Drug use: No    Observations/Objective: The patient is alert and answers questions appropriately. Visible skin is unremarkable. She is normal conversive without shortness of breath or audible wheeze. One episode of dry cough witnessed.   LMP 02/12/2011  Wt Readings from Last 3 Encounters:  06/12/18 123 lb (55.8 kg)  06/05/18 120 lb (54.4 kg)  04/04/18 120 lb (54.4 kg)   BP  Readings from Last 3 Encounters:  06/12/18 122/90  04/04/18 (!) 157/95  03/07/18 126/74     Assessment and Plan: 1. URI with cough and congestion - unclear etiology, does not sound bacteria, could be viral and can not exclude Corona virus, offered patient testing and she declined, stating she will be getting testing soon prior to a scheduled procedure. Could be some allergic rhinitis component. Discussed improve symptom control with nasal spray, cough suppressants, nasal decongestants, guaifenesin, alternating ibuprofen/acetaminophen every 4 hours, push fluids. - mometasone (NASONEX) 50 MCG/ACT  nasal spray; Place 2 sprays into the nose daily.  Dispense: 17 g; Refill: 12 - benzonatate (TESSALON) 100 MG capsule; Take 1-2 capsules (100-200 mg total) by mouth 3 (three) times daily as needed.  Dispense: 30 capsule; Refill: 0 - guaiFENesin-codeine (ROBITUSSIN AC) 100-10 MG/5ML syrup; Take 5-10 mLs by mouth at bedtime as needed for cough.  Dispense: 60 mL; Refill: 0 - she will follow up on Monday if not improved - ER precautions reviewed  Clarene Reamer, FNP-BC   Primary Care at [facility]  07/07/2018 12:13 PM   Follow Up Instructions: Patient does not have access to mychart so AVS mailed to her address on file.    I discussed the assessment and treatment plan with the patient. The patient was provided an opportunity to ask questions and all were answered. The patient agreed with the plan and demonstrated an understanding of the instructions.   The patient was advised to call back or seek an in-person evaluation if the symptoms worsen or if the condition fails to improve as anticipated.   Elby Beck, FNP

## 2018-07-05 NOTE — Patient Instructions (Signed)
Hi Jelicia,  Good to talk to you for your virtual visit on Friday.   As we discussed, I do not think you have a bacterial infection in your sinuses or lungs.  I think the following measures will help your symptoms.  Cough suppression- I sent in a prescription for Tessalon Perles as well as a nighttime prescription strength cough medicine.  Chest congestion--I want you to take over-the-counter Mucinex or the store brand is fine, this will help loosen the phlegm in your chest.  It is important that you drink enough water that your urine is light yellow.  Nasal drainage/congestion/headache- I have sent in a different nasal spray to your pharmacy, Nasonex.  Use this every evening at bedtime for 2 to 3 weeks.  For immediate relief of sinus pressure, you can use an Afrin type nasal spray, 2 sprays in each nostril twice a day for a maximum of 4 days.  For your headache, you can alternate between 400 mg of ibuprofen and 4 hours later to Tylenol.  This can be repeated for a total of 3 doses of ibuprofen and 3 doses of Tylenol daily.  Hope these things have you feeling better soon!  Warm regards,  Tor Netters, FNP-BC

## 2018-07-07 ENCOUNTER — Encounter: Payer: Self-pay | Admitting: Family Medicine

## 2018-07-08 ENCOUNTER — Telehealth: Payer: Self-pay | Admitting: Family Medicine

## 2018-07-08 NOTE — Telephone Encounter (Signed)
Patient called to update provider she seen on 6/26/   She stated she will be going to the urgent care today due to symptoms not getting any better, She stated they are actually getting worse and she can not take the way she feels any longer.

## 2018-07-08 NOTE — Telephone Encounter (Signed)
Noted  

## 2018-07-09 ENCOUNTER — Other Ambulatory Visit: Payer: Self-pay | Admitting: Psychiatry

## 2018-07-09 DIAGNOSIS — F411 Generalized anxiety disorder: Secondary | ICD-10-CM

## 2018-07-10 ENCOUNTER — Other Ambulatory Visit: Payer: Self-pay

## 2018-07-10 ENCOUNTER — Ambulatory Visit
Payer: 59 | Attending: Student in an Organized Health Care Education/Training Program | Admitting: Student in an Organized Health Care Education/Training Program

## 2018-07-10 ENCOUNTER — Encounter: Payer: Self-pay | Admitting: Student in an Organized Health Care Education/Training Program

## 2018-07-10 DIAGNOSIS — M533 Sacrococcygeal disorders, not elsewhere classified: Secondary | ICD-10-CM | POA: Diagnosis not present

## 2018-07-10 DIAGNOSIS — G894 Chronic pain syndrome: Secondary | ICD-10-CM

## 2018-07-10 DIAGNOSIS — G8929 Other chronic pain: Secondary | ICD-10-CM

## 2018-07-10 DIAGNOSIS — M7918 Myalgia, other site: Secondary | ICD-10-CM | POA: Diagnosis not present

## 2018-07-10 DIAGNOSIS — M5416 Radiculopathy, lumbar region: Secondary | ICD-10-CM

## 2018-07-10 DIAGNOSIS — M7061 Trochanteric bursitis, right hip: Secondary | ICD-10-CM

## 2018-07-10 MED ORDER — IBUPROFEN 800 MG PO TABS: 800.0000 mg | ORAL_TABLET | Freq: Every day | ORAL | 3 refills | Status: AC | PRN

## 2018-07-10 MED ORDER — OXYCODONE-ACETAMINOPHEN 10-325 MG PO TABS: 1.0000 | ORAL_TABLET | Freq: Three times a day (TID) | ORAL | 0 refills | Status: DC | PRN

## 2018-07-10 MED ORDER — OXYCODONE-ACETAMINOPHEN 10-325 MG PO TABS: 1.0000 | ORAL_TABLET | Freq: Three times a day (TID) | ORAL | 0 refills | Status: AC | PRN

## 2018-07-10 NOTE — Progress Notes (Signed)
Pain Management Virtual Encounter Note - Virtual Visit via Telephone Telehealth (real-time audio visits between healthcare provider and patient).   Patient's Phone No. & Preferred Pharmacy:  [phone] (home); There is no such number on file (mobile).; (Preferred) [phone] [email or web address]  [id number] Lorina Rabon, [facility] [address] [place] [address] Phone: [phone] Fax: [phone]    Pre-screening note:  Our staff contacted Tammy Lang and offered her an "in person", "face-to-face" appointment versus a telephone encounter. She indicated preferring the telephone encounter, at this time.   Reason for Virtual Visit: COVID-19*  Social distancing based on CDC and AMA recommendations.   I contacted Tammy Lang on 07/10/2018 via telephone.      I clearly identified myself as Gillis Santa, MD. I verified that I was speaking with the correct person using two identifiers (Name: Tammy Lang, and date of birth: 1965/06/10).  Advanced Informed Consent I sought verbal advanced consent from Tammy Lang for virtual visit interactions. I informed Tammy Lang of possible security and privacy concerns, risks, and limitations associated with providing "not-in-person" medical evaluation and management services. I also informed Tammy Lang of the availability of "in-person" appointments. Finally, I informed her that there would be a charge for the virtual visit and that she could be  personally, fully or partially, financially responsible for it. Tammy Lang expressed understanding and agreed to proceed.   Historic Elements   Tammy Lang is a 53 y.o. year old, female patient evaluated today after her last encounter by our practice on 06/14/2018. Tammy Lang  has a past medical history of Allergy, Anxiety, Arthritis, Asthma, Lipoma of chest wall (03/23/2017), and Spinal stenosis of cervical region. She also  has a past surgical  history that includes Cesarean section; Bunionectomy; Ganglion cyst excision; Breast surgery (Right); Dilation and curettage of uterus; and Mass excision (Left, 03/23/2017). Tammy Lang has a current medication list which includes the following prescription(s): albuterol, benzonatate, ibuprofen, lidocaine, mometasone, multivitamin with minerals, hair skin and nails formula, oxycodone-acetaminophen, oxycodone-acetaminophen, pravastatin, propranolol, trazodone, venlafaxine xr, and guaifenesin-codeine. She  reports that she has been smoking cigarettes. She has a 7.50 pack-year smoking history. She has never used smokeless tobacco. She reports current alcohol use. She reports that she does not use drugs. Tammy Lang is allergic to duloxetine and vistaril [hydroxyzine hcl].   HPI  Today, she is being contacted for medication management.   Patient states that she is at home given sinus infection.  No significant changes in medical history.  States that over the last week she has noticed increased pain down the anterior and lateral portion of her right leg.  She describes this as a sharp radiating pain.  This could be related to chronic lumbar radicular pain.  We discussed diagnostic lumbar epidural steroid injection.  PRN order placed as I would like patient to finish her antibiotics and recover from her sinus infection before receiving epidural steroid injection for her lumbar radicular pain.  Patient endorsed understanding.  Pharmacotherapy Assessment  Analgesic:   06/11/2018  2   04/04/2018  Oxycodone-Acetaminophen 10-325  90.00 30 Bi Lat   [id number]   Nor (2541)   0  45.00 MME  Comm Ins   [place]   }   Monitoring: Pharmacotherapy: No side-effects or adverse reactions reported.  PMP: PDMP reviewed during this encounter.       Compliance: No problems identified. Effectiveness: Clinically acceptable. Plan: Refer to "POC".  Pertinent Labs   SAFETY SCREENING Profile Lab Results  Component Value Date    SARSCOV2NAA NOT DETECTED 06/07/2018   COVIDSOURCE NASOPHARYNGEAL 06/07/2018   Renal Function Lab Results  Component Value Date   BUN 7 05/07/2018   CREATININE 0.81 16/10/9602   BCR NOT APPLICABLE 54/09/8117   Hepatic Function Lab Results  Component Value Date   AST 23 05/07/2018   ALT 27 05/07/2018   ALBUMIN 4.6 05/07/2018   UDS Summary  Date Value Ref Range Status  11/27/2017 FINAL  Final    Comment:    ==================================================================== TOXASSURE SELECT 13 (MW) ==================================================================== Test                             Result       Flag       Units Drug Present   Oxycodone                      116                     ng/mg creat   Oxymorphone                    1356                    ng/mg creat   Noroxycodone                   996                     ng/mg creat   Noroxymorphone                 545                     ng/mg creat    Sources of oxycodone are scheduled prescription medications.    Oxymorphone, noroxycodone, and noroxymorphone are expected    metabolites of oxycodone. Oxymorphone is also available as a    scheduled prescription medication. ==================================================================== Test                      Result    Flag   Units      Ref Range   Creatinine              160              mg/dL      >=20 ==================================================================== Declared Medications:  Medication list was not provided. ==================================================================== For clinical consultation, please call [phone]. ====================================================================    Note: Above Lab results reviewed.  Recent imaging  DG PAIN CLINIC C-ARM 1-60 MIN NO REPORT Fluoro was used, but no Radiologist interpretation will be provided.  Please refer to "NOTES" tab for provider progress note.  Assessment   The primary encounter diagnosis was Lumbar radiculopathy. Diagnoses of Myofascial pain, Chronic SI joint pain, Trochanteric bursitis of right hip, Chronic pain syndrome, and Chronic radicular lumbar pain were also pertinent to this visit.  Plan of Care  I have changed Tammy Lang's oxyCODONE-acetaminophen. I am also having her start on oxyCODONE-acetaminophen. Additionally, I am having her maintain her Hair Skin and Nails Formula, multivitamin with minerals, lidocaine, albuterol, pravastatin, venlafaxine XR, traZODone, mometasone, benzonatate, guaiFENesin-codeine, propranolol, and ibuprofen.  Pharmacotherapy (Medications Ordered): Meds ordered this encounter  Medications  . oxyCODONE-acetaminophen (PERCOCET) 10-325 MG tablet    Sig: Take 1 tablet by mouth every 8 (eight)  hours as needed for pain. Must last 30 days.    Dispense:  90 tablet    Refill:  0    [place] STOP ACT - Not applicable. Fill one day early if pharmacy is closed on scheduled refill date.  Marland Kitchen oxyCODONE-acetaminophen (PERCOCET) 10-325 MG tablet    Sig: Take 1 tablet by mouth every 8 (eight) hours as needed for pain. Must last 30 days.    Dispense:  90 tablet    Refill:  0    [place] STOP ACT - Not applicable. Fill one day early if pharmacy is closed on scheduled refill date.  Marland Kitchen ibuprofen (ADVIL) 800 MG tablet    Sig: Take 1 tablet (800 mg total) by mouth daily as needed for moderate pain.    Dispense:  30 tablet    Refill:  3   Orders:  Orders Placed This Encounter  Procedures  . Lumbar Epidural Injection    Standing Status:   Standing    Number of Occurrences:   9    Standing Expiration Date:   01/10/2020    Scheduling Instructions:     Purpose: Palliative     Indication: Lower extremity pain/Sciatica unspecified side (M54.30).     Side: Midline     Level: TBD     Sedation: Patient's choice.     TIMEFRAME: PRN procedure. (Ms. Hahne will call when needed.)    Order Specific Question:   Where will this procedure be  performed?    Answer:   [facility]   Follow-up plan:   Return in about 8 weeks (around 09/04/2018) for Medication Management.        Recent Visits Date Type Provider Dept  06/12/18 Procedure visit Gillis Santa, MD [facility]  Showing recent visits within past 90 days and meeting all other requirements   Today's Visits Date Type Provider Dept  07/10/18 Office Visit Gillis Santa, MD [facility]  Showing today's visits and meeting all other requirements   Future Appointments Date Type Provider Dept  09/05/18 Appointment Gillis Santa, MD [facility]  Showing future appointments within next 90 days and meeting all other requirements   I discussed the assessment and treatment plan with the patient. The patient was provided an opportunity to ask questions and all were answered. The patient agreed with the plan and demonstrated an understanding of the instructions.  Patient advised to call back or seek an in-person evaluation if the symptoms or condition worsens.  Total duration of non-face-to-face encounter: 25 minutes.  Note by: Gillis Santa, MD Date: 07/10/2018; Time: 3:26 PM  Note: This dictation was prepared with Dragon dictation. Any transcriptional errors that may result from this process are unintentional.  Disclaimer:  * Given the special circumstances of the COVID-19 pandemic, the federal government has announced that the Office for Civil Rights (OCR) will exercise its enforcement discretion and will not impose penalties on physicians using telehealth in the event of noncompliance with regulatory requirements under the [place] and [place] (HIPAA) in connection with the good faith provision of telehealth during the IZTIW-58 national public health emergency. ([place])

## 2018-07-12 ENCOUNTER — Other Ambulatory Visit: Payer: Self-pay | Admitting: Internal Medicine

## 2018-07-12 DIAGNOSIS — R059 Cough, unspecified: Secondary | ICD-10-CM

## 2018-07-12 DIAGNOSIS — R05 Cough: Secondary | ICD-10-CM

## 2018-08-07 ENCOUNTER — Telehealth: Payer: Self-pay

## 2018-08-07 DIAGNOSIS — F5105 Insomnia due to other mental disorder: Secondary | ICD-10-CM

## 2018-08-07 DIAGNOSIS — F411 Generalized anxiety disorder: Secondary | ICD-10-CM

## 2018-08-07 MED ORDER — TRAZODONE HCL 100 MG PO TABS: 100.0000 mg | ORAL_TABLET | Freq: Every evening | ORAL | 0 refills | Status: DC | PRN

## 2018-08-07 MED ORDER — QUETIAPINE FUMARATE 50 MG PO TABS: 25.0000 mg | ORAL_TABLET | Freq: Every day | ORAL | 1 refills | Status: DC

## 2018-08-07 MED ORDER — VENLAFAXINE HCL ER 150 MG PO CP24: 150.0000 mg | ORAL_CAPSULE | Freq: Every day | ORAL | 1 refills | Status: DC

## 2018-08-07 NOTE — Telephone Encounter (Signed)
Return call to patient.  Patient was last seen in February.  She did not follow through with recommendations.  She reports today that she is not doing well at all with her mood.  She is going through a lot of psychosocial stressors, relationship conflict problems with her son, being admitted, job loss, financial problems.  Patient reports she did not note that the clinics were open and that is why she did not schedule an appointment.  She however reports she did not call to find out.  Patient reports she has upcoming appointment with Ms. Peacock tomorrow.  Discussed with patient that she needs to schedule an appointment with writer for med management.  Also discussed with her that her medications can be readjusted today.,  Discussing  in increasing venlafaxine to 150 mg p.o. daily  Discussed adding Seroquel 25 to 50 mg p.o. nightly for mood and sleep.  Discussed with her to reduce the dosage of trazodone 200 mg and use it only as needed since she is on Seroquel now.  Discussed with her about more intensive psychotherapy sessions/IOP program if needed.  Patient reports she wants her Xanax back, discussed with her that Xanax is not the answer given the fact that she never followed through with her recommendation and did not have any medication changes since the past 6 months.

## 2018-08-07 NOTE — Telephone Encounter (Signed)
pt called states the medication is not working and that she needs to speak with you about her issues. pt was transfered to front because she was last seen in february . pt was told that a message would be sent to dr. Shea Evans for her to call in regard to her medications.

## 2018-08-12 ENCOUNTER — Ambulatory Visit
Admission: RE | Admit: 2018-08-12 | Discharge: 2018-08-12 | Disposition: A | Discharge status: Home / Self Care | Payer: 59 | Source: Ambulatory Visit | Attending: Student in an Organized Health Care Education/Training Program | Admitting: Student in an Organized Health Care Education/Training Program

## 2018-08-12 ENCOUNTER — Ambulatory Visit
Payer: 59 | Attending: Student in an Organized Health Care Education/Training Program | Admitting: Student in an Organized Health Care Education/Training Program

## 2018-08-12 ENCOUNTER — Other Ambulatory Visit: Payer: Self-pay

## 2018-08-12 ENCOUNTER — Encounter: Payer: Self-pay | Admitting: Student in an Organized Health Care Education/Training Program

## 2018-08-12 DIAGNOSIS — M5416 Radiculopathy, lumbar region: Secondary | ICD-10-CM | POA: Diagnosis not present

## 2018-08-12 MED ORDER — SODIUM CHLORIDE (PF) 0.9 % IJ SOLN
INTRAMUSCULAR | Status: AC
  Filled 2018-08-12: qty 10

## 2018-08-12 MED ORDER — ROPIVACAINE HCL 2 MG/ML IJ SOLN
INTRAMUSCULAR | Status: AC
  Filled 2018-08-12: qty 10

## 2018-08-12 MED ORDER — DEXAMETHASONE SODIUM PHOSPHATE 10 MG/ML IJ SOLN
10.0000 mg | Freq: Once | INTRAMUSCULAR | Status: AC
  Administered 2018-08-12: 10 mg

## 2018-08-12 MED ORDER — IOHEXOL 180 MG/ML  SOLN
10.0000 mL | Freq: Once | INTRAMUSCULAR | Status: AC
  Administered 2018-08-12: 09:00:00 10 mL via EPIDURAL

## 2018-08-12 MED ORDER — FENTANYL CITRATE (PF) 100 MCG/2ML IJ SOLN
INTRAMUSCULAR | Status: AC
  Filled 2018-08-12: qty 2

## 2018-08-12 MED ORDER — LIDOCAINE HCL 2 % IJ SOLN
INTRAMUSCULAR | Status: AC
  Filled 2018-08-12: qty 20

## 2018-08-12 MED ORDER — DEXAMETHASONE SODIUM PHOSPHATE 10 MG/ML IJ SOLN
INTRAMUSCULAR | Status: AC
  Filled 2018-08-12: qty 1

## 2018-08-12 MED ORDER — ROPIVACAINE HCL 2 MG/ML IJ SOLN
1.0000 mL | Freq: Once | INTRAMUSCULAR | Status: AC
  Administered 2018-08-12: 10 mL via EPIDURAL

## 2018-08-12 MED ORDER — FENTANYL CITRATE (PF) 100 MCG/2ML IJ SOLN
25.0000 ug | INTRAMUSCULAR | Status: DC | PRN
  Administered 2018-08-12: 09:00:00 50 ug via INTRAVENOUS

## 2018-08-12 MED ORDER — SODIUM CHLORIDE 0.9% FLUSH
1.0000 mL | Freq: Once | INTRAVENOUS | Status: AC
  Administered 2018-08-12: 10 mL

## 2018-08-12 NOTE — Patient Instructions (Signed)

## 2018-08-12 NOTE — Progress Notes (Signed)
Safety precautions to be maintained throughout the outpatient stay will include: orient to surroundings, keep bed in low position, maintain call bell within reach at all times, provide assistance with transfer out of bed and ambulation.  

## 2018-08-12 NOTE — Progress Notes (Signed)
Patient's Name: Tammy Lang  MRN: [id number]  Referring Provider: Gillis Santa, MD  DOB: 22-May-1965  PCP: Jearld Fenton, NP  DOS: 08/12/2018  Note by: Gillis Santa, MD  Service setting: Ambulatory outpatient  Specialty: Interventional Pain Management  Patient type: Established  Location: [facility] (AMB) Pain Management Facility  Visit type: Interventional Procedure   Primary Reason for Visit: Interventional Pain Management Treatment. CC: Back Pain (low left)  Procedure:          Anesthesia, Analgesia, Anxiolysis:  Type: Diagnostic Inter-Laminar Epidural Steroid Injection  #1  Region: Lumbar Level: L4-5 Level. Laterality: Left-Sided         Type: Moderate (Conscious) Sedation combined with Local Anesthesia Indication(s): Analgesia and Anxiety Route: Intravenous (IV) IV Access: Secured Sedation: Meaningful verbal contact was maintained at all times during the procedure  Local Anesthetic: Lidocaine 1-2%  Position: Prone with head of the table was raised to facilitate breathing.   Indications: 1. Lumbar radiculopathy    Pain Score: Pre-procedure: 10-Worst pain ever/10 Post-procedure: 0-No pain/10   Pre-op Assessment:  Ms. Taflinger is a 53 y.o. (year old), female patient, seen today for interventional treatment. She  has a past surgical history that includes Cesarean section; Bunionectomy; Ganglion cyst excision; Breast surgery (Right); Dilation and curettage of uterus; and Mass excision (Left, 03/23/2017). Ms. Youngberg has a current medication list which includes the following prescription(s): benzonatate, ibuprofen, lidocaine, mometasone, multivitamin with minerals, hair skin and nails formula, oxycodone-acetaminophen, pravastatin, propranolol, quetiapine, trazodone, venlafaxine xr, ventolin hfa, and guaifenesin-codeine, and the following Facility-Administered Medications: fentanyl. Her primarily concern today is the Back Pain (low left)  Initial Vital Signs:  Pulse/HCG Rate: 87ECG  Heart Rate: 85 Temp: 98.3 F (36.8 C) Resp: 18 BP: (!) 140/95 SpO2: 100 %  BMI: Estimated body mass index is 24.24 kg/m as calculated from the following:   Height as of this encounter: 4\' 11"  (1.499 m).   Weight as of this encounter: 120 lb (54.4 kg).  Risk Assessment: Allergies: Reviewed. She is allergic to duloxetine and vistaril [hydroxyzine hcl].  Allergy Precautions: None required Coagulopathies: Reviewed. None identified.  Blood-thinner therapy: None at this time Active Infection(s): Reviewed. None identified. Ms. Hackleman is afebrile  Site Confirmation: Ms. Messenger was asked to confirm the procedure and laterality before marking the site Procedure checklist: Completed Consent: Before the procedure and under the influence of no sedative(s), amnesic(s), or anxiolytics, the patient was informed of the treatment options, risks and possible complications. To fulfill our ethical and legal obligations, as recommended by the American Medical Association's Code of Ethics, I have informed the patient of my clinical impression; the nature and purpose of the treatment or procedure; the risks, benefits, and possible complications of the intervention; the alternatives, including doing nothing; the risk(s) and benefit(s) of the alternative treatment(s) or procedure(s); and the risk(s) and benefit(s) of doing nothing. The patient was provided information about the general risks and possible complications associated with the procedure. These may include, but are not limited to: failure to achieve desired goals, infection, bleeding, organ or nerve damage, allergic reactions, paralysis, and death. In addition, the patient was informed of those risks and complications associated to Spine-related procedures, such as failure to decrease pain; infection (i.e.: Meningitis, epidural or intraspinal abscess); bleeding (i.e.: epidural hematoma, subarachnoid hemorrhage, or any other type of intraspinal or  peri-dural bleeding); organ or nerve damage (i.e.: Any type of peripheral nerve, nerve root, or spinal cord injury) with subsequent damage to sensory, motor, and/or autonomic systems, resulting in  permanent pain, numbness, and/or weakness of one or several areas of the body; allergic reactions; (i.e.: anaphylactic reaction); and/or death. Furthermore, the patient was informed of those risks and complications associated with the medications. These include, but are not limited to: allergic reactions (i.e.: anaphylactic or anaphylactoid reaction(s)); adrenal axis suppression; blood sugar elevation that in diabetics may result in ketoacidosis or comma; water retention that in patients with history of congestive heart failure may result in shortness of breath, pulmonary edema, and decompensation with resultant heart failure; weight gain; swelling or edema; medication-induced neural toxicity; particulate matter embolism and blood vessel occlusion with resultant organ, and/or nervous system infarction; and/or aseptic necrosis of one or more joints. Finally, the patient was informed that Medicine is not an exact science; therefore, there is also the possibility of unforeseen or unpredictable risks and/or possible complications that may result in a catastrophic outcome. The patient indicated having understood very clearly. We have given the patient no guarantees and we have made no promises. Enough time was given to the patient to ask questions, all of which were answered to the patient's satisfaction. Ms. Padgett has indicated that she wanted to continue with the procedure. Attestation: I, the ordering provider, attest that I have discussed with the patient the benefits, risks, side-effects, alternatives, likelihood of achieving goals, and potential problems during recovery for the procedure that I have provided informed consent. Date  Time: 08/12/2018  8:31 AM  Pre-Procedure Preparation:  Monitoring: As per clinic  protocol. Respiration, ETCO2, SpO2, BP, heart rate and rhythm monitor placed and checked for adequate function Safety Precautions: Patient was assessed for positional comfort and pressure points before starting the procedure. Time-out: I initiated and conducted the "Time-out" before starting the procedure, as per protocol. The patient was asked to participate by confirming the accuracy of the "Time Out" information. Verification of the correct person, site, and procedure were performed and confirmed by me, the nursing staff, and the patient. "Time-out" conducted as per Joint Commission's Universal Protocol (UP.01.01.01). Time: 0905  Description of Procedure:          Target Area: The interlaminar space, initially targeting the lower laminar border of the superior vertebral body. Approach: Paramedial approach. Area Prepped: Entire Posterior Lumbar Region Prepping solution: DuraPrep (Iodine Povacrylex [0.7% available iodine] and Isopropyl Alcohol, 74% w/w) Safety Precautions: Aspiration looking for blood return was conducted prior to all injections. At no point did we inject any substances, as a needle was being advanced. No attempts were made at seeking any paresthesias. Safe injection practices and needle disposal techniques used. Medications properly checked for expiration dates. SDV (single dose vial) medications used. Description of the Procedure: Protocol guidelines were followed. The procedure needle was introduced through the skin, ipsilateral to the reported pain, and advanced to the target area. Bone was contacted and the needle walked caudad, until the lamina was cleared. The epidural space was identified using "loss-of-resistance technique" with 2-3 ml of PF-NaCl (0.9% NSS), in a 5cc LOR glass syringe.  Vitals:   08/12/18 0914 08/12/18 0921 08/12/18 0931 08/12/18 0942  BP: (!) 160/89 (!) 161/83 131/87 132/83  Pulse:      Resp: 14 14 15 15   Temp:  97.7 F (36.5 C)  97.6 F (36.4 C)   TempSrc:    Temporal  SpO2: 98% 100% 98% 98%  Weight:      Height:        Start Time: 0905 hrs. End Time: 0916 hrs.  Materials:  Needle(s) Type: Epidural needle Gauge:  17G Length: 3.5-in Medication(s): Please see orders for medications and dosing details. 8 cc solution made of 5 cc of preservative-free saline, 2 cc of 0.2% ropivacaine, 1 cc of Decadron 10 mg/cc.  Imaging Guidance (Spinal):          Type of Imaging Technique: Fluoroscopy Guidance (Spinal) Indication(s): Assistance in needle guidance and placement for procedures requiring needle placement in or near specific anatomical locations not easily accessible without such assistance. Exposure Time: Please see nurses notes. Contrast: Before injecting any contrast, we confirmed that the patient did not have an allergy to iodine, shellfish, or radiological contrast. Once satisfactory needle placement was completed at the desired level, radiological contrast was injected. Contrast injected under live fluoroscopy. No contrast complications. See chart for type and volume of contrast used. Fluoroscopic Guidance: I was personally present during the use of fluoroscopy. "Tunnel Vision Technique" used to obtain the best possible view of the target area. Parallax error corrected before commencing the procedure. "Direction-depth-direction" technique used to introduce the needle under continuous pulsed fluoroscopy. Once target was reached, antero-posterior, oblique, and lateral fluoroscopic projection used confirm needle placement in all planes. Images permanently stored in EMR. Interpretation: I personally interpreted the imaging intraoperatively. Adequate needle placement confirmed in multiple planes. Appropriate spread of contrast into desired area was observed. No evidence of afferent or efferent intravascular uptake. No intrathecal or subarachnoid spread observed. Permanent images saved into the patient's record.  Antibiotic Prophylaxis:    Anti-infectives (From admission, onward)   None     Indication(s): None identified  Post-operative Assessment:  Post-procedure Vital Signs:  Pulse/HCG Rate: 8781 Temp: 97.6 F (36.4 C) Resp: 15 BP: 132/83 SpO2: 98 %  EBL: None  Complications: No immediate post-treatment complications observed by team, or reported by patient.  Note: The patient tolerated the entire procedure well. A repeat set of vitals were taken after the procedure and the patient was kept under observation following institutional policy, for this type of procedure. Post-procedural neurological assessment was performed, showing return to baseline, prior to discharge. The patient was provided with post-procedure discharge instructions, including a section on how to identify potential problems. Should any problems arise concerning this procedure, the patient was given instructions to immediately contact us, at any time, without hesitation. In any case, we plan to contact the patient by telephone for a follow-up status report regarding this interventional procedure.  Comments:  No additional relevant information. 5 out of 5 strength bilateral lower extremity: Plantar flexion, dorsiflexion, knee flexion, knee extension.  Plan of Care  Orders:  Orders Placed This Encounter  Procedures  . DG PAIN CLINIC C-ARM 1-60 MIN NO REPORT    Intraoperative interpretation by procedural physician at [place].    Standing Status:   Standing    Number of Occurrences:   1    Order Specific Question:   Reason for exam:    Answer:   Assistance in needle guidance and placement for procedures requiring needle placement in or near specific anatomical locations not easily accessible without such assistance.   Chronic Opioid Analgesic:Percocet 10 mg TID, #90/ MME 45 Medications ordered for procedure: Meds ordered this encounter  Medications  . iohexol (OMNIPAQUE) 180 MG/ML injection 10 mL    Must be  Myelogram-compatible. If not available, you may substitute with a water-soluble, non-ionic, hypoallergenic, myelogram-compatible radiological contrast medium.  . fentaNYL (SUBLIMAZE) injection 25-50 mcg    Make sure Narcan is available in the pyxis when using this medication. In the event of respiratory  depression (RR< 8/min): Titrate NARCAN (naloxone) in increments of 0.1 to 0.2 mg IV at 2-3 minute intervals, until desired degree of reversal.  . sodium chloride flush (NS) 0.9 % injection 1 mL  . ropivacaine (PF) 2 mg/mL (0.2%) (NAROPIN) injection 1 mL  . dexamethasone (DECADRON) injection 10 mg   Medications administered: We administered iohexol, fentaNYL, sodium chloride flush, ropivacaine (PF) 2 mg/mL (0.2%), and dexamethasone.  See the medical record for exact dosing, route, and time of administration.  Follow-up plan:   Return for keep august f/u.     Recent Visits Date Type Provider Dept  07/10/18 Office Visit Gillis Santa, MD [facility]  06/12/18 Procedure visit Gillis Santa, MD [facility]  Showing recent visits within past 90 days and meeting all other requirements   Today's Visits Date Type Provider Dept  08/12/18 Procedure visit Gillis Santa, MD [facility]  Showing today's visits and meeting all other requirements   Future Appointments Date Type Provider Dept  09/05/18 Appointment Gillis Santa, MD [facility]  Showing future appointments within next 90 days and meeting all other requirements   Disposition: Discharge home  Discharge Date & Time: 08/12/2018; 0943 hrs.   Primary Care Physician: Jearld Fenton, NP Location: [facility] Note by: Gillis Santa, MD Date: 08/12/2018; Time: 12:59 PM  Disclaimer:  Medicine is not an exact science. The only guarantee in medicine is that nothing is guaranteed. It is important to note that the decision to proceed with this intervention was based on the  information collected from the patient. The Data and conclusions were drawn from the patient's questionnaire, the interview, and the physical examination. Because the information was provided in large part by the patient, it cannot be guaranteed that it has not been purposely or unconsciously manipulated. Every effort has been made to obtain as much relevant data as possible for this evaluation. It is important to note that the conclusions that lead to this procedure are derived in large part from the available data. Always take into account that the treatment will also be dependent on availability of resources and existing treatment guidelines, considered by other Pain Management Practitioners as being common knowledge and practice, at the time of the intervention. For Medico-Legal purposes, it is also important to point out that variation in procedural techniques and pharmacological choices are the acceptable norm. The indications, contraindications, technique, and results of the above procedure should only be interpreted and judged by a Board-Certified Interventional Pain Specialist with extensive familiarity and expertise in the same exact procedure and technique.

## 2018-08-13 ENCOUNTER — Telehealth: Payer: Self-pay

## 2018-08-13 NOTE — Telephone Encounter (Signed)
Post procedure phone call.  LM 

## 2018-08-17 IMAGING — MR MR CERVICAL SPINE W/O CM
5 series · 36 of 48 positions shown · non-contrast
Comparison: None.

CLINICAL DATA: Cervicalgia and radiculitis

EXAM:
MRI CERVICAL SPINE WITHOUT CONTRAST
TECHNIQUE: Multiplanar, multisequence MR imaging of the cervical spine was
performed. No intravenous contrast was administered.

[Series 2: T2 · sagittal · 3.0mm · 0.70mm/px · 8 of 13 slices shown (1 of 2)]
[im 1/13]
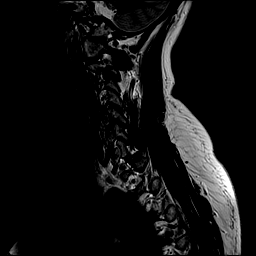
[im 2/13]
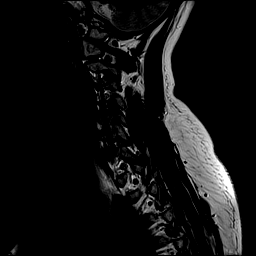
[im 4/13]
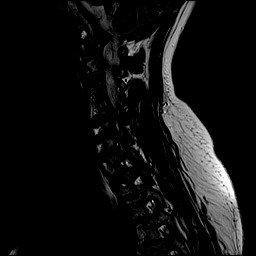
[im 6/13]
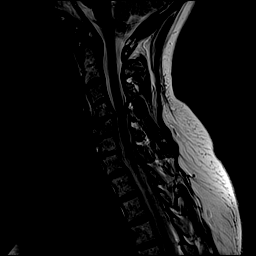
[im 7/13]
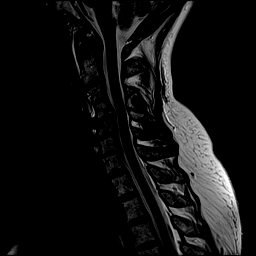
[im 9/13]
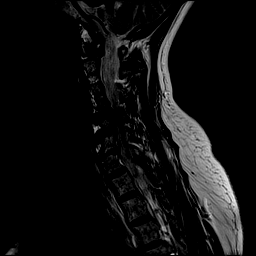
[im 11/13]
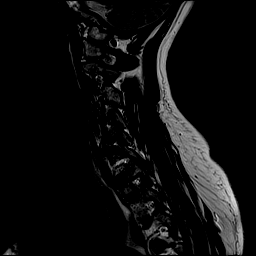
[im 13/13]
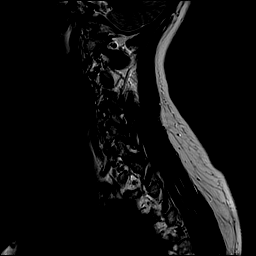

[Series 3: T1 · sagittal · 3.0mm · 0.70mm/px · 7 of 13 slices shown]
[im 1/13]
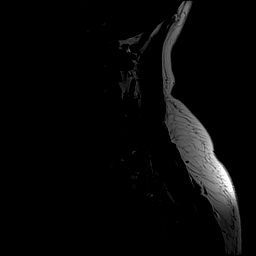
[im 3/13]
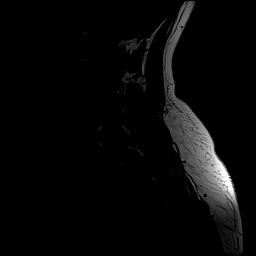
[im 5/13]
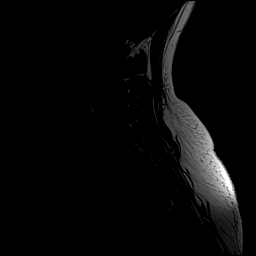
[im 7/13]
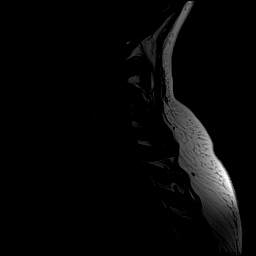
[im 9/13]
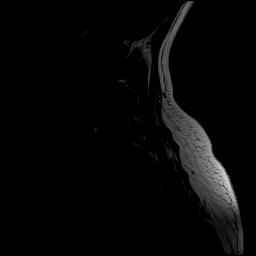
[im 11/13]
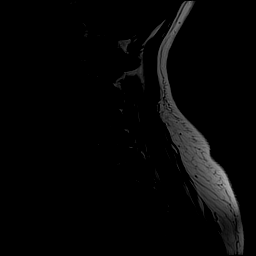
[im 13/13]
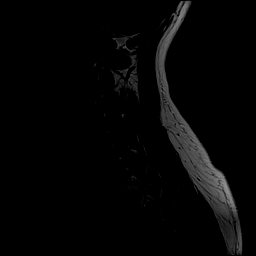

[Series 4: STIR · sagittal · 3.0mm · 0.35mm/px · 7 of 13 slices shown]
[im 1/13]
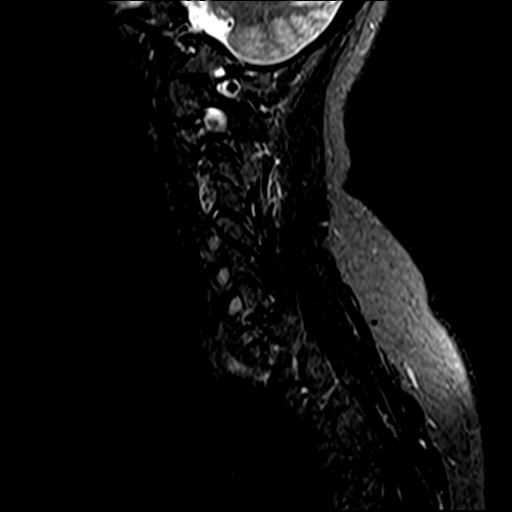
[im 3/13]
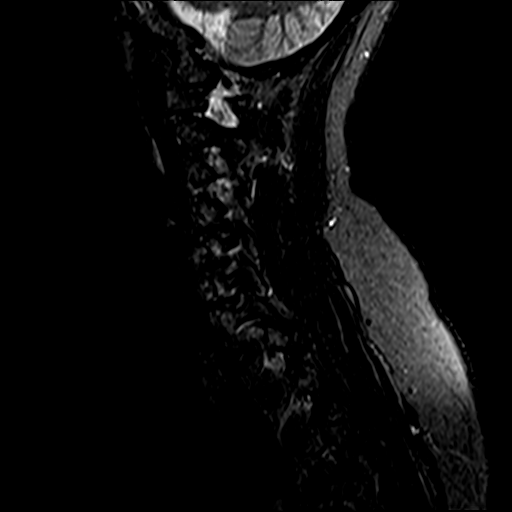
[im 5/13]
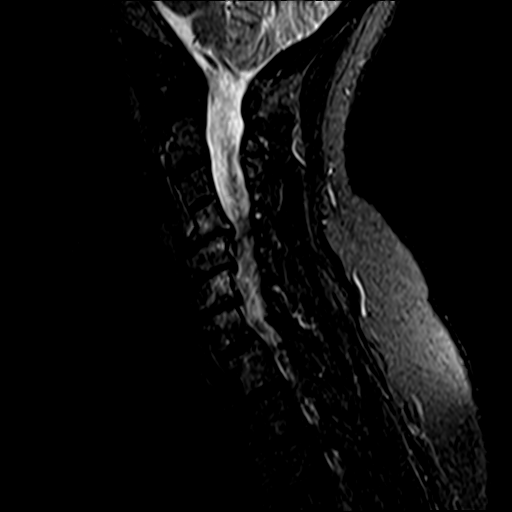
[im 7/13]
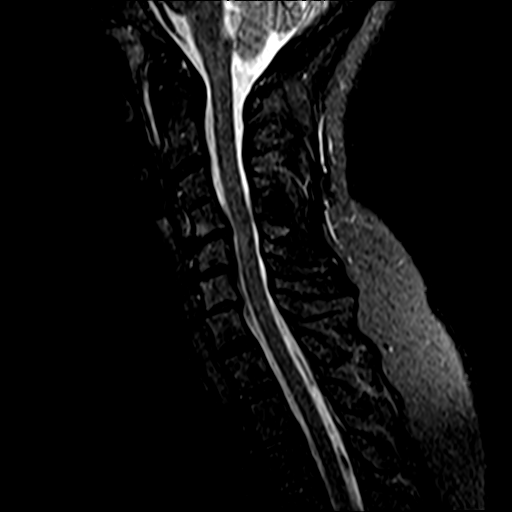
[im 9/13]
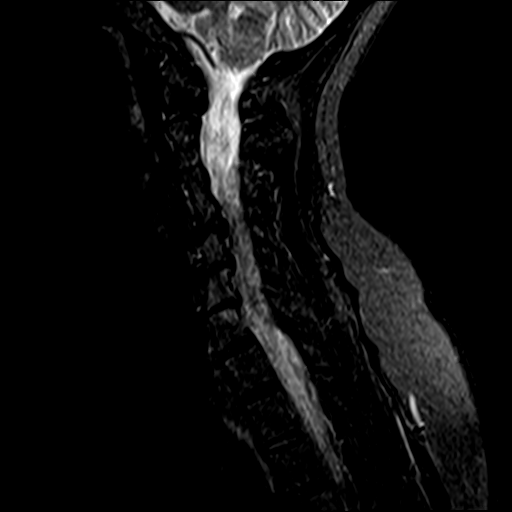
[im 11/13]
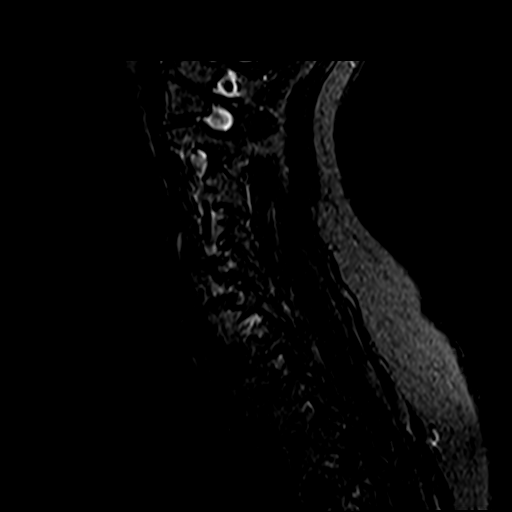
[im 13/13]
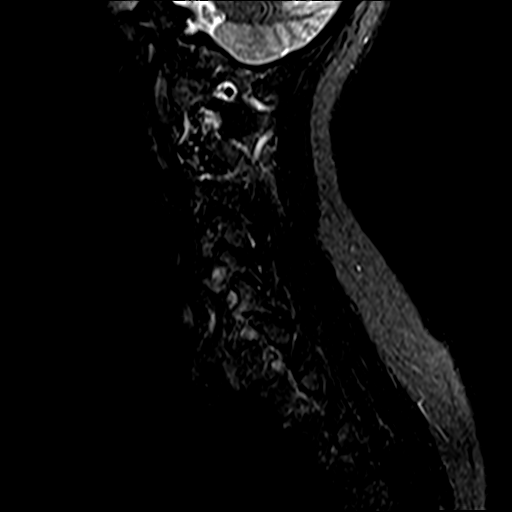

[Series 5: T2 · axial · 3.0mm · 0.70mm/px · z∈[-59,+22]mm · 9 of 22 slices shown (2 of 2)]
[im 1/22]
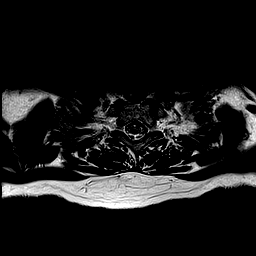
[im 4/22]
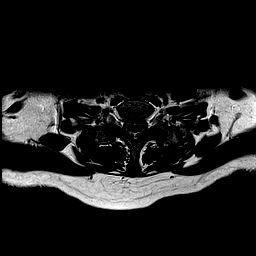
[im 8/22]
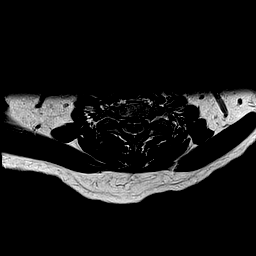
[im 9/22]
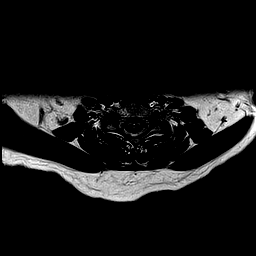
[im 11/22]
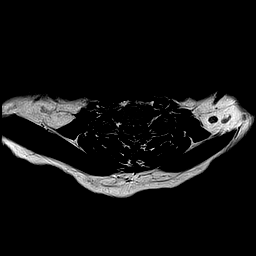
[im 13/22]
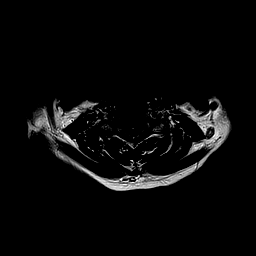
[im 15/22]
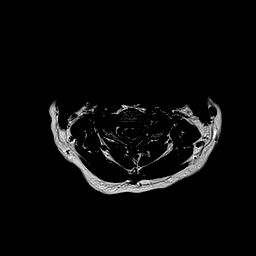
[im 18/22]
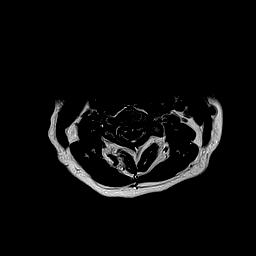
[im 22/22]
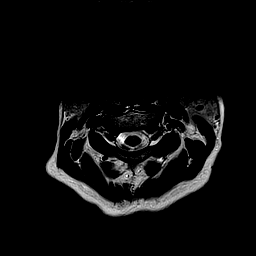

[Series 6: mpgr ax · axial · 3.0mm · 0.35mm/px · z∈[-59,-15]mm · 5 of 23 slices shown]
[im 1/23]
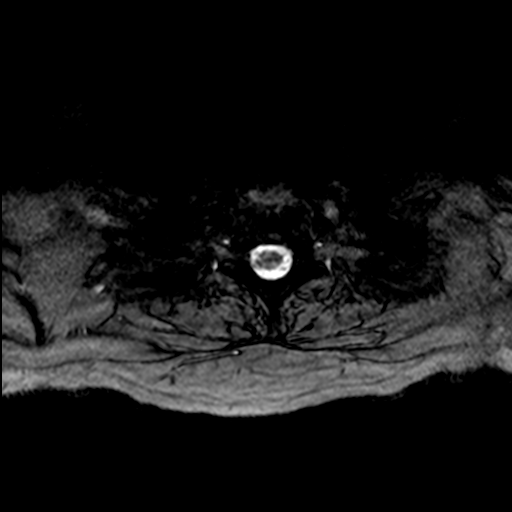
[im 4/23]
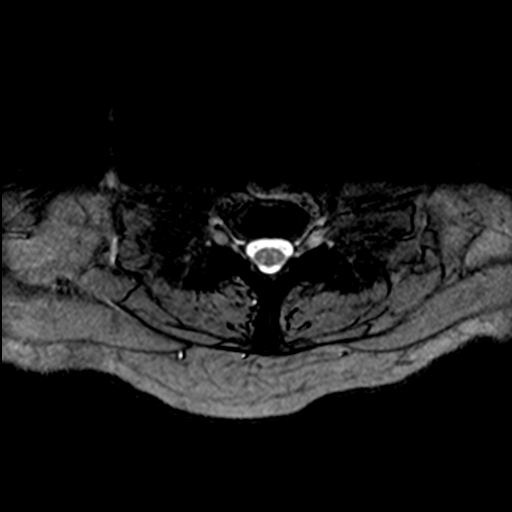
[im 8/23]
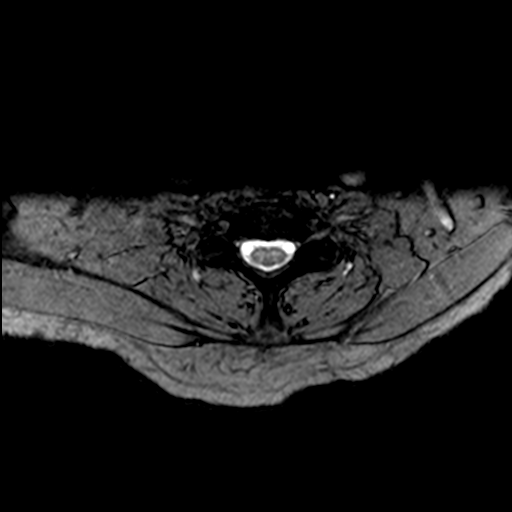
[im 10/23]
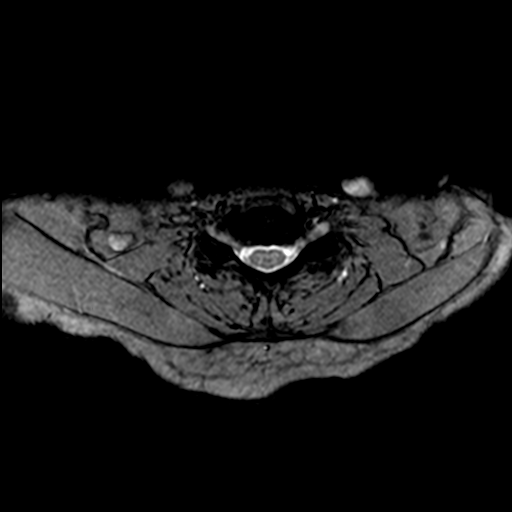
[im 13/23]
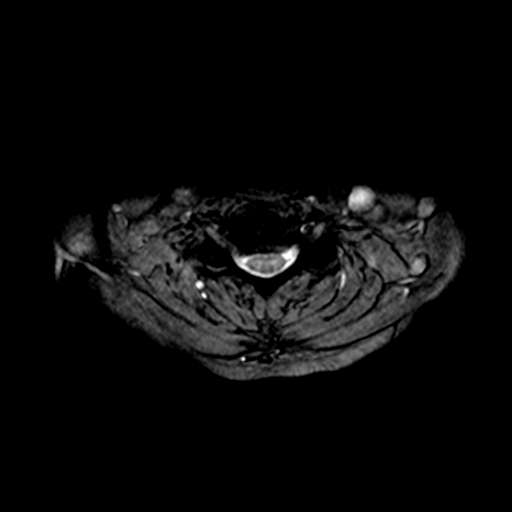

[36 of 48 positions shown; findings below may reference images not displayed]

FINDINGS: Alignment: 3 mm retrolisthesis C4-5. Mild retrolisthesis C5-6 and
C6-7. Mild anterolisthesis C7-T1

Vertebrae: Congenital fusion C3-4. Negative for fracture or mass.
Normal bone marrow

Cord: Normal signal and morphology

Posterior Fossa, vertebral arteries, paraspinal tissues: Negative

Disc levels:

C2-3: Tiny central disc protrusion.  Negative for stenosis

C3-4: Congenital fusion.  Negative for stenosis

C4-5: 3 mm retrolisthesis. Moderate disc degeneration and diffuse
uncinate spurring causing moderate foraminal stenosis right greater
than left. Mild spinal stenosis.

C5-6: Mild retrolisthesis. Mild disc degeneration and spurring. Mild
foraminal stenosis bilaterally

C6-7: Mild retrolisthesis. Disc degeneration and bilateral uncinate
spurring. Mild foraminal narrowing bilaterally.

C7-T1: Mild anterolisthesis. Negative for stenosis. Bilateral facet
degeneration.
IMPRESSION: Congenital fusion C3-4

Moderate disc degeneration and spurring C4-5. Mild spinal stenosis
and moderate foraminal stenosis right greater than left

Mild foraminal narrowing bilaterally C5-6 and C6-7 due to spurring.

## 2018-08-26 ENCOUNTER — Telehealth: Payer: Self-pay

## 2018-08-26 ENCOUNTER — Other Ambulatory Visit: Payer: Self-pay | Admitting: Psychiatry

## 2018-08-26 DIAGNOSIS — F330 Major depressive disorder, recurrent, mild: Secondary | ICD-10-CM

## 2018-08-26 DIAGNOSIS — F411 Generalized anxiety disorder: Secondary | ICD-10-CM

## 2018-08-26 NOTE — Telephone Encounter (Signed)
I have communicated with Elmyra Ricks and she is working with her.

## 2018-08-26 NOTE — Telephone Encounter (Signed)
pt called states she left several messages for nicole to call her back but no one has called her back. pt states she not doing well that her anxiety is high. pt states she wanted to speak to someone today. pt was transfered to front desk to make an appt to be seen and pt was told that a message would be sent to dr. Shea Evans and with nicole.

## 2018-08-28 ENCOUNTER — Other Ambulatory Visit: Payer: Self-pay

## 2018-08-28 ENCOUNTER — Ambulatory Visit (INDEPENDENT_AMBULATORY_CARE_PROVIDER_SITE_OTHER): Payer: 59 | Admitting: Psychiatry

## 2018-08-28 ENCOUNTER — Encounter: Payer: Self-pay | Admitting: Psychiatry

## 2018-08-28 DIAGNOSIS — F5105 Insomnia due to other mental disorder: Secondary | ICD-10-CM

## 2018-08-28 DIAGNOSIS — F330 Major depressive disorder, recurrent, mild: Secondary | ICD-10-CM

## 2018-08-28 DIAGNOSIS — F17200 Nicotine dependence, unspecified, uncomplicated: Secondary | ICD-10-CM | POA: Diagnosis not present

## 2018-08-28 DIAGNOSIS — F411 Generalized anxiety disorder: Secondary | ICD-10-CM | POA: Diagnosis not present

## 2018-08-28 MED ORDER — VENLAFAXINE HCL ER 37.5 MG PO CP24: 37.5000 mg | ORAL_CAPSULE | Freq: Every day | ORAL | 1 refills | Status: DC

## 2018-08-28 MED ORDER — QUETIAPINE FUMARATE 50 MG PO TABS: 75.0000 mg | ORAL_TABLET | Freq: Every day | ORAL | 1 refills | Status: DC

## 2018-08-28 NOTE — Progress Notes (Signed)
Virtual Visit via Video Note  I connected with Tammy Lang on 08/28/18 at  3:45 PM EDT by a video enabled telemedicine application and verified that I am speaking with the correct person using two identifiers.   I discussed the limitations of evaluation and management by telemedicine and the availability of in person appointments. The patient expressed understanding and agreed to proceed.    I discussed the assessment and treatment plan with the patient. The patient was provided an opportunity to ask questions and all were answered. The patient agreed with the plan and demonstrated an understanding of the instructions.   The patient was advised to call back or seek an in-person evaluation if the symptoms worsen or if the condition fails to improve as anticipated.   BH MD/PA/NP OP Progress Note  08/28/2018 6:18 PM Tammy Lang  MRN:  [id number]  Chief Complaint:  Chief Complaint    Follow-up     HPI: Tammy Lang is a 53 year old Hispanic female, married, employed, lives in [place], has a history of depression, anxiety, chronic pain, hyperlipidemia was evaluated by telemedicine today.  Patient reports several psychosocial stressors at this time.  She reports her son got in to trouble again. She reports he is currently going to jail and may have to serve up to 10 years.  She reports she lost her job recently.  Patient reports [place] in [facility].  She reports she and her husband are trying to start all over again.  She is trying to find a job.  She also has to find a new provider there.  Patient reports she continues to struggle with anxiety and mood lability.  She reports she struggles with crying spells, and that at times when she sleep issues.  She denies any suicidality.  Patient denies any homicidality or perceptual disturbances.  Discussed with patient about continuing psychotherapy sessions.  She however reports she is trying to  find a new provider-psychiatry as well as psychotherapist close to where she lives.  She reports that her therapist Ms. Peacock had told her that she will call her with the referral.  Discussed with patient that Probation officer will communicate with Ms. Peacock as well.  Visit Diagnosis:    ICD-10-CM   1. GAD (generalized anxiety disorder)  F41.1   2. MDD (major depressive disorder), recurrent episode, mild ([place])  F33.0   3. Insomnia due to mental condition  F51.05 QUEtiapine (SEROQUEL) 50 MG tablet  4. Tobacco use disorder  F17.200     Past Psychiatric History: I have reviewed past psychiatric history from my progress note on 12/26/2017.  Past trials of Wellbutrin, Cymbalta, venlafaxine, hydroxyzine, trazodone, Xanax  Past Medical History:  Past Medical History:  Diagnosis Date  . Allergy   . Anxiety   . Arthritis   . Asthma   . Lipoma of chest wall 03/23/2017  . Spinal stenosis of cervical region     Past Surgical History:  Procedure Laterality Date  . BREAST SURGERY Right    benign tumor  . BUNIONECTOMY    . CESAREAN SECTION    . DILATION AND CURETTAGE OF UTERUS    . GANGLION CYST EXCISION    . MASS EXCISION Left 03/23/2017   Procedure: EXCISION OF LEFT CHEST WALL MASS;  Surgeon: Fanny Skates, MD;  Location: [facility] ORS;  Service: General;  Laterality: Left;    Family Psychiatric History: I have reviewed family psychiatric history from my progress note on 12/26/2017. Family History:  Family  History  Problem Relation Age of Onset  . Alcohol abuse Mother   . Cervical cancer Mother   . Hodgkin's lymphoma Sister   . ADD / ADHD Son   . Drug abuse Son     Social History: I have reviewed social history from my progress note on 12/26/2017. Social History   Socioeconomic History  . Marital status: Married    Spouse name: vincent  . Number of children: 2  . Years of education: Not on file  . Highest education level: High school graduate  Occupational History  . Not on file   Social Needs  . Financial resource strain: Not hard at all  . Food insecurity    Worry: Never true    Inability: Never true  . Transportation needs    Medical: No    Non-medical: No  Tobacco Use  . Smoking status: Current Every Day Smoker    Packs/day: 0.25    Years: 30.00    Pack years: 7.50    Types: Cigarettes    Last attempt to quit: 06/04/2018    Years since quitting: 0.2  . Smokeless tobacco: Never Used  Substance and Sexual Activity  . Alcohol use: Yes    Comment: occasional  . Drug use: No  . Sexual activity: Yes    Birth control/protection: Surgical  Lifestyle  . Physical activity    Days per week: 0 days    Minutes per session: 0 min  . Stress: Very much  Relationships  . Social Herbalist on phone: Not on file    Gets together: Not on file    Attends religious service: Never    Active member of club or organization: No    Attends meetings of clubs or organizations: Never    Relationship status: Married  Other Topics Concern  . Not on file  Social History Narrative  . Not on file    Allergies:  Allergies  Allergen Reactions  . Duloxetine     Raises BP  . Vistaril [Hydroxyzine Hcl]     Hands go numb    Metabolic Disorder Labs: No results found for: HGBA1C, MPG No results found for: PROLACTIN Lab Results  Component Value Date   CHOL 243 (H) 05/07/2018   TRIG 166.0 (H) 05/07/2018   HDL 73.10 05/07/2018   CHOLHDL 3 05/07/2018   VLDL 33.2 05/07/2018   LDLCALC 137 (H) 05/07/2018   LDLCALC 149 (H) 01/25/2018   No results found for: TSH  Therapeutic Level Labs: No results found for: LITHIUM No results found for: VALPROATE No components found for:  CBMZ  Current Medications: Current Outpatient Medications  Medication Sig Dispense Refill  . benzonatate (TESSALON) 100 MG capsule Take 1-2 capsules (100-200 mg total) by mouth 3 (three) times daily as needed. 30 capsule 0  . guaiFENesin-codeine (ROBITUSSIN AC) 100-10 MG/5ML syrup Take  5-10 mLs by mouth at bedtime as needed for cough. (Patient not taking: Reported on 07/09/2018) 60 mL 0  . ibuprofen (ADVIL) 800 MG tablet Take 1 tablet (800 mg total) by mouth daily as needed for moderate pain. 30 tablet 3  . lidocaine (LIDODERM) 5 % Place 1 patch onto the skin daily. Remove & Discard patch within 12 hours or as directed by MD 30 patch 2  . mometasone (NASONEX) 50 MCG/ACT nasal spray Place 2 sprays into the nose daily. 17 g 12  . Multiple Vitamin (MULTIVITAMIN WITH MINERALS) TABS tablet Take 1 tablet by mouth daily.    Marland Kitchen  Multiple Vitamins-Minerals (HAIR SKIN AND NAILS FORMULA) TABS Take 1 tablet by mouth daily.    Marland Kitchen oxyCODONE-acetaminophen (PERCOCET) 10-325 MG tablet Take 1 tablet by mouth every 8 (eight) hours as needed for pain. Must last 30 days. 90 tablet 0  . pravastatin (PRAVACHOL) 10 MG tablet TAKE 1 TABLET BY MOUTH EVERYDAY AT BEDTIME 90 tablet 1  . propranolol (INDERAL) 10 MG tablet TAKE 1 TABLET (10 MG TOTAL) BY MOUTH 2 (TWO) TIMES DAILY AS NEEDED FOR SEVERE ANXIETY 180 tablet 1  . QUEtiapine (SEROQUEL) 50 MG tablet Take 1.5 tablets (75 mg total) by mouth daily with supper. 45 tablet 1  . traZODone (DESYREL) 100 MG tablet Take 1 tablet (100 mg total) by mouth at bedtime as needed for sleep. 135 tablet 0  . venlafaxine XR (EFFEXOR-XR) 150 MG 24 hr capsule Take 1 capsule (150 mg total) by mouth daily with breakfast. 30 capsule 1  . venlafaxine XR (EFFEXOR-XR) 37.5 MG 24 hr capsule Take 1 capsule (37.5 mg total) by mouth daily with breakfast. To be combined with 150 mg 30 capsule 1  . VENTOLIN HFA 108 (90 Base) MCG/ACT inhaler TAKE 2 PUFFS BY MOUTH EVERY 6 HOURS AS NEEDED FOR WHEEZE OR SHORTNESS OF BREATH 18 g 0   No current facility-administered medications for this visit.      Musculoskeletal: Strength & Muscle Tone: UTA Gait & Station: normal Patient leans: N/A  Psychiatric Specialty Exam: Review of Systems  Psychiatric/Behavioral: Positive for depression. The  patient is nervous/anxious and has insomnia.   All other systems reviewed and are negative.   Last menstrual period 02/12/2011.There is no height or weight on file to calculate BMI.  General Appearance: Casual  Eye Contact:  Fair  Speech:  Normal Rate  Volume:  Normal  Mood:  Anxious and Depressed  Affect:  Congruent  Thought Process:  Goal Directed and Descriptions of Associations: Intact  Orientation:  Full (Time, Place, and Person)  Thought Content: Logical   Suicidal Thoughts:  No  Homicidal Thoughts:  No  Memory:  Immediate;   Fair Recent;   Fair Remote;   Fair  Judgement:  Fair  Insight:  Fair  Psychomotor Activity:  Normal  Concentration:  Concentration: Fair and Attention Span: Fair  Recall:  [employer] of Knowledge: Fair  Language: Fair  Akathisia:  No  Handed:  Right  AIMS (if indicated): Denies tremors, rigidity  Assets:  Communication Skills Desire for Improvement Social Support  ADL's:  Intact  Cognition: WNL  Sleep:  Poor   Screenings: GAD-7     Office Visit from 11/09/2017 in [place] [facility]  Total GAD-7 Score  16    PHQ2-9     Procedure visit from 08/12/2018 in [place] Procedure visit from 06/12/2018 in [place] Office Visit from 04/04/2018 in [place] Procedure visit from 01/30/2018 in [place] from 01/24/2018 in [place]  PHQ-2 Total Score  1  0  0  0  0       Assessment and Plan: Tammy Lang is a 53 year old Hispanic female who has a history of depression, anxiety, insomnia, hyperlipidemia was evaluated by telemedicine today.  She is biologically predisposed given her family history.  She also has psychosocial stressors of her son who is having mental health issues, substance abuse problems and  legal issues.  Patient recently lost her job and also has financial problems.  Patient will benefit from continued medication management as well as psychotherapy sessions.  She however reports she has relocated to [facility] and is currently trying to find a new provider there.  Plan MDD- unstable Increase venlafaxine XR  to 187.5 mg p.o. daily Continue CBT-she will find a new provider.  For GAD-unstable Venlafaxine as prescribed Increase Seroquel to 75 mg p.o. nightly Propranolol 10 mg p.o. twice daily as needed for anxiety attacks  For insomnia-unstable Seroquel 75 mg p.o. nightly Trazodone as prescribed.  For tobacco use disorder-unstable Provided smoking cessation counseling.  I have communicated with Ms. Peacock regarding patient.  Patient advised to also work with her primary care provider to find a new provider close to where she lives.  I have spent atleast 15 MINUTES non face to face with patient today. More than 50 % of the time was spent for psychoeducation and supportive psychotherapy and care coordination.  This note was generated in part or whole with voice recognition software. Voice recognition is usually quite accurate but there are transcription errors that can and very often do occur. I apologize for any typographical errors that were not detected and corrected.        Ursula Alert, MD 08/28/2018, 6:18 PM

## 2018-08-29 ENCOUNTER — Ambulatory Visit (INDEPENDENT_AMBULATORY_CARE_PROVIDER_SITE_OTHER): Payer: 59 | Admitting: Licensed Clinical Social Worker

## 2018-08-29 DIAGNOSIS — F330 Major depressive disorder, recurrent, mild: Secondary | ICD-10-CM | POA: Diagnosis not present

## 2018-08-29 DIAGNOSIS — F411 Generalized anxiety disorder: Secondary | ICD-10-CM

## 2018-08-31 ENCOUNTER — Other Ambulatory Visit: Payer: Self-pay | Admitting: Psychiatry

## 2018-08-31 DIAGNOSIS — F411 Generalized anxiety disorder: Secondary | ICD-10-CM

## 2018-09-03 ENCOUNTER — Encounter: Payer: Self-pay | Admitting: Student in an Organized Health Care Education/Training Program

## 2018-09-05 ENCOUNTER — Encounter: Payer: Self-pay | Admitting: Student in an Organized Health Care Education/Training Program

## 2018-09-05 ENCOUNTER — Other Ambulatory Visit: Payer: Self-pay

## 2018-09-05 ENCOUNTER — Ambulatory Visit
Payer: 59 | Attending: Student in an Organized Health Care Education/Training Program | Admitting: Student in an Organized Health Care Education/Training Program

## 2018-09-05 DIAGNOSIS — G894 Chronic pain syndrome: Secondary | ICD-10-CM

## 2018-09-05 DIAGNOSIS — M533 Sacrococcygeal disorders, not elsewhere classified: Secondary | ICD-10-CM | POA: Diagnosis not present

## 2018-09-05 DIAGNOSIS — M25521 Pain in right elbow: Secondary | ICD-10-CM

## 2018-09-05 DIAGNOSIS — M7061 Trochanteric bursitis, right hip: Secondary | ICD-10-CM

## 2018-09-05 DIAGNOSIS — M5416 Radiculopathy, lumbar region: Secondary | ICD-10-CM | POA: Diagnosis not present

## 2018-09-05 DIAGNOSIS — M7918 Myalgia, other site: Secondary | ICD-10-CM

## 2018-09-05 DIAGNOSIS — M47812 Spondylosis without myelopathy or radiculopathy, cervical region: Secondary | ICD-10-CM

## 2018-09-05 DIAGNOSIS — M249 Joint derangement, unspecified: Secondary | ICD-10-CM

## 2018-09-05 DIAGNOSIS — G8929 Other chronic pain: Secondary | ICD-10-CM

## 2018-09-05 MED ORDER — OXYCODONE-ACETAMINOPHEN 10-325 MG PO TABS: 1.0000 | ORAL_TABLET | Freq: Three times a day (TID) | ORAL | 0 refills | Status: DC | PRN

## 2018-09-05 MED ORDER — LIDOCAINE 5 % EX OINT: 1.0000 "application " | TOPICAL_OINTMENT | Freq: Four times a day (QID) | CUTANEOUS | 2 refills | Status: AC | PRN

## 2018-09-05 MED ORDER — LIDOCAINE 5 % EX PTCH: 1.0000 | MEDICATED_PATCH | CUTANEOUS | 2 refills | Status: AC

## 2018-09-05 NOTE — Progress Notes (Signed)
Pain Management Virtual Encounter Note - Virtual Visit via [place] (real-time audio visits between healthcare provider and patient).   Patient's Phone No. & Preferred Pharmacy:  [phone] (home); [phone] (mobile); (Preferred) [phone] [email or web address]  [id number]on, [place] [address] [place] [address] Phone: [phone] Fax: [phone]  [facility] [phone] [place] [address] [place], [place] [place] [address] Phone: [phone] Fax: [phone]    Pre-screening note:  Our staff contacted Ms. Jurgens and offered her an "in person", "face-to-face" appointment versus a telephone encounter. She indicated preferring the telephone encounter, at this time.   Reason for Virtual Visit: COVID-19*  Social distancing based on CDC and AMA recommendations.   I contacted [name] on 09/05/2018 via video conference.      I clearly identified myself as Gillis Santa, MD. I verified that I was speaking with the correct person using two identifiers (Name: Goldene Fortenberry, and date of birth: Apr 01, 1965).  Advanced Informed Consent I sought verbal advanced consent from [name] for virtual visit interactions. I informed Ms. Cudd of possible security and privacy concerns, risks, and limitations associated with providing "not-in-person" medical evaluation and management services. I also informed Ms. Mcmanus of the availability of "in-person" appointments. Finally, I informed her that there would be a charge for the virtual visit and that she could be  personally, fully or partially, financially responsible for it. Ms. Hutto expressed understanding and agreed to proceed.   Historic Elements   Ms. Javaeh Thompsen is a 53 y.o. year old, female patient evaluated today after her last encounter by our practice on 08/13/2018. Ms. Meulemans  has a past medical history of  Allergy, Anxiety, Arthritis, Asthma, Lipoma of chest wall (03/23/2017), and Spinal stenosis of cervical region. She also  has a past surgical history that includes Cesarean section; Bunionectomy; Ganglion cyst excision; Breast surgery (Right); Dilation and curettage of uterus; and Mass excision (Left, 03/23/2017). Ms. Calzadilla has a current medication list which includes the following prescription(s): benzonatate, ibuprofen, lidocaine, mometasone, multivitamin with minerals, hair skin and nails formula, oxycodone-acetaminophen, oxycodone-acetaminophen, pravastatin, propranolol, quetiapine, trazodone, venlafaxine xr, venlafaxine xr, ventolin hfa, guaifenesin-codeine, and lidocaine. She  reports that she has been smoking cigarettes. She has a 7.50 pack-year smoking history. She has never used smokeless tobacco. She reports current alcohol use. She reports that she does not use drugs. Ms. Kubina is allergic to duloxetine and vistaril [hydroxyzine hcl].   HPI  Today, she is being contacted for medication management.   Unfortunately epidural steroid injection was not effective for patient.  Will not repeat.  She has moved to [facility].  She was in a difficult social situation with her son.  She states that her son is now incarcerated and that she feels safe.  I did not go into the details.  Patient was requesting an increase in her Percocet which I declined.  I informed her that we will not escalate her dose from this and this will be her maximum dose.  Patient endorsed understanding.  Patient is tried and failed various multimodal analgesics including gabapentin, various muscle relaxers including Flexeril, tizanidine, Robaxin.  She does find benefit with Lidoderm patch and lidocaine cream which we can refill.  Instructed the patient that we will need a urine screen at the next visit.  Pharmacotherapy Assessment  Analgesic: 08/09/2018  2   07/10/2018  Oxycodone-Acetaminophen 10-325  90.00 30 Bi  Lat   [id number]  Nor (2541)   0  45.00 MME  Comm Ins   [place]    Monitoring: Pharmacotherapy: No side-effects or adverse reactions reported. [place] PMP: PDMP reviewed during this encounter.       Compliance: No problems identified. Effectiveness: Clinically acceptable. Plan: Refer to "POC".  UDS:  Summary  Date Value Ref Range Status  11/27/2017 FINAL  Final    Comment:    ==================================================================== TOXASSURE SELECT 13 (MW) ==================================================================== Test                             Result       Flag       Units Drug Present   Oxycodone                      116                     ng/mg creat   Oxymorphone                    1356                    ng/mg creat   Noroxycodone                   996                     ng/mg creat   Noroxymorphone                 545                     ng/mg creat    Sources of oxycodone are scheduled prescription medications.    Oxymorphone, noroxycodone, and noroxymorphone are expected    metabolites of oxycodone. Oxymorphone is also available as a    scheduled prescription medication. ==================================================================== Test                      Result    Flag   Units      Ref Range   Creatinine              160              mg/dL      >=20 ==================================================================== Declared Medications:  Medication list was not provided. ==================================================================== For clinical consultation, please call [phone]. ====================================================================    Laboratory Chemistry Profile (12 mo)  Renal: 01/25/2018: BUN/Creatinine Ratio NOT APPLICABLE [id number]: BUN 7; Creatinine, Ser 0.81  No results found for: Anise Salvo Hepatic: 05/07/2018: Albumin 4.6 Lab Results  Component Value Date   AST 23 05/07/2018   ALT 27 05/07/2018    Other: 05/07/2018: VITD 38.18 Note: Above Lab results reviewed.  Assessment  The primary encounter diagnosis was Lumbar radiculopathy. Diagnoses of Myofascial pain, Chronic SI joint pain, Trochanteric bursitis of right hip, Chronic radicular lumbar pain, Derangement of right SI joint, Chronic pain of right elbow, Spondylosis of cervical region without myelopathy or radiculopathy, and Chronic pain syndrome were also pertinent to this visit.  Plan of Care  I am having [name] start on lidocaine and oxyCODONE-acetaminophen. I am also having her maintain her Hair Skin and Nails Formula, multivitamin with minerals, pravastatin, mometasone, benzonatate, guaiFENesin-codeine, propranolol, ibuprofen, Ventolin HFA, traZODone, venlafaxine XR, QUEtiapine, venlafaxine XR, lidocaine, and oxyCODONE-acetaminophen.  Pharmacotherapy (Medications Ordered): Meds ordered this encounter  Medications  . lidocaine (LIDODERM) 5 %    Sig: Place 1 patch onto the skin daily. Remove & Discard patch within 12 hours or as directed by MD    Dispense:  30 patch    Refill:  2  . oxyCODONE-acetaminophen (PERCOCET) 10-325 MG tablet    Sig: Take 1 tablet by mouth every 8 (eight) hours as needed for pain. Must last 30 days.    Dispense:  90 tablet    Refill:  0    [place] STOP ACT - Not applicable. Fill one day early if pharmacy is closed on scheduled refill date.  . lidocaine (XYLOCAINE) 5 % ointment    Sig: Apply 1 application topically 4 (four) times daily as needed for moderate pain. Maximum dose: 5 g/application (approximately 6 inches of ointment); 20 g/day    Dispense:  35.44 g    Refill:  2    Fill one day early if pharmacy is closed on scheduled refill date. May substitute for generic if available.  Marland Kitchen oxyCODONE-acetaminophen (PERCOCET) 10-325 MG tablet    Sig: Take 1 tablet by mouth every 8 (eight) hours as needed for pain. Must last 30 days.    Dispense:  90 tablet    Refill:  0    [place] STOP ACT - Not  applicable. Fill one day early if pharmacy is closed on scheduled refill date.   Follow-up plan:   Return in about 8 weeks (around 10/31/2018) for Medication Management, in person.    Recent Visits Date Type Provider Dept  08/12/18 Procedure visit Gillis Santa, MD [facility]  07/10/18 Office Visit Gillis Santa, MD [facility]  06/12/18 Procedure visit Gillis Santa, MD [facility]  Showing recent visits within past 90 days and meeting all other requirements   Today's Visits Date Type Provider Dept  09/05/18 Office Visit Gillis Santa, MD [facility]  Showing today's visits and meeting all other requirements   Future Appointments No visits were found meeting these conditions.  Showing future appointments within next 90 days and meeting all other requirements   I discussed the assessment and treatment plan with the patient. The patient was provided an opportunity to ask questions and all were answered. The patient agreed with the plan and demonstrated an understanding of the instructions.  Patient advised to call back or seek an in-person evaluation if the symptoms or condition worsens.  Total duration of non-face-to-face encounter: 25 minutes.  Note by: Gillis Santa, MD Date: 09/05/2018; Time: 10:02 AM  Note: This dictation was prepared with Dragon dictation. Any transcriptional errors that may result from this process are unintentional.  Disclaimer:  * Given the special circumstances of the COVID-19 pandemic, the federal government has announced that the Office for Civil Rights (OCR) will exercise its enforcement discretion and will not impose penalties on physicians using telehealth in the event of noncompliance with regulatory requirements under the [place] and [place] (HIPAA) in connection with the good faith provision of telehealth during the XX123456 national public health emergency. ([place])

## 2018-09-06 ENCOUNTER — Other Ambulatory Visit: Payer: Self-pay | Admitting: Psychiatry

## 2018-09-06 DIAGNOSIS — F5105 Insomnia due to other mental disorder: Secondary | ICD-10-CM

## 2018-09-06 DIAGNOSIS — F411 Generalized anxiety disorder: Secondary | ICD-10-CM

## 2018-09-08 NOTE — Progress Notes (Signed)
Virtual Visit via Telephone Note  I connected with Tammy Lang on 08/29/18 at  2:00 PM EDT by telephone and verified that I am speaking with the correct person using two identifiers.  Location: Patient: home Provider: office   I discussed the limitations, risks, security and privacy concerns of performing an evaluation and management service by telephone and the availability of in person appointments. I also discussed with the patient that there may be a patient responsible charge related to this service. The patient expressed understanding and agreed to proceed.     I discussed the assessment and treatment plan with the patient. The patient was provided an opportunity to ask questions and all were answered. The patient agreed with the plan and demonstrated an understanding of the instructions.   The patient was advised to call back or seek an in-person evaluation if the symptoms worsen or if the condition fails to improve as anticipated.  I provided 30 minutes of non-face-to-face time during this encounter.   Lubertha South, LCSW    Session Time: 30  Participation Level: Active  Type of Therapy: Individual Therapy  Treatment Goals addressed: Anxiety  Interventions: Supportive  Summary: Tammy Lang is a 53 y.o. female who presents with continued symptoms of diagnosis.  Writer actively listened and provided support as Patient discussed the recent major changes in her life.  She has pressed charges on her son and he is now facing 10-20 yrs in prison, lost her job, home and car, moving to [place], [place].  Encouraged self care and recommended referral to [facility] agency in her area.   Suicidal/Homicidal: No  Plan: Return again in 1 weeks.  Diagnosis: Axis I: Depression & Anxiety    Axis II: No diagnosis    Lubertha South, LCSW 08/29/2018

## 2018-09-09 ENCOUNTER — Telehealth: Payer: Self-pay

## 2018-09-09 MED ORDER — OXYCODONE-ACETAMINOPHEN 10-325 MG PO TABS: 1.0000 | ORAL_TABLET | Freq: Three times a day (TID) | ORAL | 0 refills | Status: AC | PRN

## 2018-09-09 NOTE — Telephone Encounter (Signed)
Requested Prescriptions   Signed Prescriptions Disp Refills  . oxyCODONE-acetaminophen (PERCOCET) 10-325 MG tablet 90 tablet 0    Sig: Take 1 tablet by mouth every 8 (eight) hours as needed for pain. Must last 30 days.    Authorizing Provider: Gillis Santa  . oxyCODONE-acetaminophen (PERCOCET) 10-325 MG tablet 90 tablet 0    Sig: Take 1 tablet by mouth every 8 (eight) hours as needed for pain. Must last 30 days.    Authorizing Provider: Gillis Santa   [facility] [place]

## 2018-09-09 NOTE — Telephone Encounter (Signed)
Dr. Holley Raring,                Are you willing to transfer her opoid to another pharmacy? It will have to be re-e scribed. Please let me know so that I can speak with patient. Thank you.            Anderson Malta

## 2018-09-09 NOTE — Telephone Encounter (Signed)
Pt called and is requesting her pain meds be sent to [facility]( [place] to [employer]) Ph # [phone]. All her meds transferred except pain med.

## 2018-09-10 ENCOUNTER — Other Ambulatory Visit: Payer: Self-pay | Admitting: Psychiatry

## 2018-09-30 ENCOUNTER — Ambulatory Visit: Payer: 59 | Admitting: Licensed Clinical Social Worker

## 2018-10-02 ENCOUNTER — Other Ambulatory Visit: Payer: Self-pay | Admitting: Psychiatry

## 2018-10-02 DIAGNOSIS — F5105 Insomnia due to other mental disorder: Secondary | ICD-10-CM

## 2018-10-03 ENCOUNTER — Ambulatory Visit: Payer: 59 | Admitting: Licensed Clinical Social Worker

## 2018-10-03 ENCOUNTER — Other Ambulatory Visit: Payer: Self-pay

## 2018-10-15 ENCOUNTER — Telehealth: Payer: Self-pay | Admitting: *Deleted

## 2018-10-17 ENCOUNTER — Other Ambulatory Visit: Payer: Self-pay

## 2018-10-17 ENCOUNTER — Ambulatory Visit: Payer: 59 | Admitting: Licensed Clinical Social Worker

## 2018-10-23 ENCOUNTER — Other Ambulatory Visit: Payer: Self-pay

## 2018-10-23 ENCOUNTER — Ambulatory Visit (INDEPENDENT_AMBULATORY_CARE_PROVIDER_SITE_OTHER): Payer: 59 | Admitting: Psychiatry

## 2018-10-23 ENCOUNTER — Encounter: Payer: Self-pay | Admitting: Psychiatry

## 2018-10-23 DIAGNOSIS — F411 Generalized anxiety disorder: Secondary | ICD-10-CM | POA: Diagnosis not present

## 2018-10-23 DIAGNOSIS — F17200 Nicotine dependence, unspecified, uncomplicated: Secondary | ICD-10-CM

## 2018-10-23 DIAGNOSIS — F330 Major depressive disorder, recurrent, mild: Secondary | ICD-10-CM | POA: Diagnosis not present

## 2018-10-23 DIAGNOSIS — G4701 Insomnia due to medical condition: Secondary | ICD-10-CM

## 2018-10-23 MED ORDER — QUETIAPINE FUMARATE 100 MG PO TABS: 150.0000 mg | ORAL_TABLET | Freq: Every day | ORAL | 1 refills | Status: DC

## 2018-10-23 MED ORDER — QUETIAPINE FUMARATE 25 MG PO TABS: 25.0000 mg | ORAL_TABLET | Freq: Every day | ORAL | 1 refills | Status: DC | PRN

## 2018-10-23 NOTE — Progress Notes (Signed)
Virtual Visit via Video Note  I connected with Tammy Lang on 10/23/18 at 11:00 AM EDT by a video enabled telemedicine application and verified that I am speaking with the correct person using two identifiers.   I discussed the limitations of evaluation and management by telemedicine and the availability of in person appointments. The patient expressed understanding and agreed to proceed.   I discussed the assessment and treatment plan with the patient. The patient was provided an opportunity to ask questions and all were answered. The patient agreed with the plan and demonstrated an understanding of the instructions.   The patient was advised to call back or seek an in-person evaluation if the symptoms worsen or if the condition fails to improve as anticipated.  [place] MD OP Progress Note  10/23/2018 5:30 PM Tammy Lang  MRN:  [id number]  Chief Complaint:  Chief Complaint    Follow-up     HPI: Tammy Lang is a 53 year old Hispanic female, married, employed, lives in [place], has a history of depression, anxiety, chronic pain, hyperlipidemia was evaluated by telemedicine today.  Patient today reports she continues to have several psychosocial stressors of the recent relocation, financial problems and her son having legal issues.  Patient is currently looking for a job.    Patient reports she has continues to struggle with anxiety and mood lability.  She reports sleep is also affected.  It is more so because of her sciatica.  She did have an appointment with a pain provider and hopefully that may help to find something to manage her pain.  She reports she has not been able to find a provider for her mental health where she lives.  She however is trying to find one.  Patient denies any suicidality, homicidality or perceptual disturbances.  She reports she is interested in increasing her Seroquel dosage.  She reports she may not have enough trazodone available however discussed  with her that trazodone supplies were sent to pharmacy in [place] previously.  She reports she did not pick 90-day supply.  Discussed with her that she needs to clarify with the pharmacy before sending more supplies in.  Patient denies any other concerns today. Visit Diagnosis:    ICD-10-CM   1. GAD (generalized anxiety disorder)  F41.1 QUEtiapine (SEROQUEL) 100 MG tablet    QUEtiapine (SEROQUEL) 25 MG tablet  2. MDD (major depressive disorder), recurrent episode, mild (HCC)  F33.0 QUEtiapine (SEROQUEL) 100 MG tablet  3. Insomnia due to medical condition  G47.01 QUEtiapine (SEROQUEL) 100 MG tablet    QUEtiapine (SEROQUEL) 25 MG tablet   pain  4. Tobacco use disorder  F17.200     Past Psychiatric History: Reviewed past psychiatric history from my progress note on 12/26/2017.  Past trials of Wellbutrin, Cymbalta, venlafaxine, hydroxyzine, trazodone, Xanax.  Past Medical History:  Past Medical History:  Diagnosis Date  . Allergy   . Anxiety   . Arthritis   . Asthma   . Lipoma of chest wall 03/23/2017  . Spinal stenosis of cervical region     Past Surgical History:  Procedure Laterality Date  . BREAST SURGERY Right    benign tumor  . BUNIONECTOMY    . CESAREAN SECTION    . DILATION AND CURETTAGE OF UTERUS    . GANGLION CYST EXCISION    . MASS EXCISION Left 03/23/2017   Procedure: EXCISION OF LEFT CHEST WALL MASS;  Surgeon: Fanny Skates, MD;  Location: [facility] ORS;  Service: General;  Laterality: Left;    Family  Psychiatric History: Reviewed family psychiatric history from my progress note on 12/26/2017.  Family History:  Family History  Problem Relation Age of Onset  . Alcohol abuse Mother   . Cervical cancer Mother   . Hodgkin's lymphoma Sister   . ADD / ADHD Son   . Drug abuse Son     Social History: I have reviewed social history from my progress note on 12/26/2017. Social History   Socioeconomic History  . Marital status: Married    Spouse name: vincent  .  Number of children: 2  . Years of education: Not on file  . Highest education level: High school graduate  Occupational History  . Not on file  Social Needs  . Financial resource strain: Not hard at all  . Food insecurity    Worry: Never true    Inability: Never true  . Transportation needs    Medical: No    Non-medical: No  Tobacco Use  . Smoking status: Current Every Day Smoker    Packs/day: 0.25    Years: 30.00    Pack years: 7.50    Types: Cigarettes    Last attempt to quit: 06/04/2018    Years since quitting: 0.3  . Smokeless tobacco: Never Used  Substance and Sexual Activity  . Alcohol use: Yes    Comment: occasional  . Drug use: No  . Sexual activity: Yes    Birth control/protection: Surgical  Lifestyle  . Physical activity    Days per week: 0 days    Minutes per session: 0 min  . Stress: Very much  Relationships  . Social Herbalist on phone: Not on file    Gets together: Not on file    Attends religious service: Never    Active member of club or organization: No    Attends meetings of clubs or organizations: Never    Relationship status: Married  Other Topics Concern  . Not on file  Social History Narrative  . Not on file    Allergies:  Allergies  Allergen Reactions  . Duloxetine     Raises BP  . Vistaril [Hydroxyzine Hcl]     Hands go numb    Metabolic Disorder Labs: No results found for: HGBA1C, MPG No results found for: PROLACTIN Lab Results  Component Value Date   CHOL 243 (H) 05/07/2018   TRIG 166.0 (H) 05/07/2018   HDL 73.10 05/07/2018   CHOLHDL 3 05/07/2018   VLDL 33.2 05/07/2018   LDLCALC 137 (H) 05/07/2018   LDLCALC 149 (H) 01/25/2018   No results found for: TSH  Therapeutic Level Labs: No results found for: LITHIUM No results found for: VALPROATE No components found for:  CBMZ  Current Medications: Current Outpatient Medications  Medication Sig Dispense Refill  . benzonatate (TESSALON) 100 MG capsule Take 1-2  capsules (100-200 mg total) by mouth 3 (three) times daily as needed. 30 capsule 0  . EFFEXOR XR 37.5 MG 24 hr capsule TAKE 1 CAPSULE (37.5 MG TOTAL) BY MOUTH DAILY WITH BREAKFAST. TO BE COMBINED WITH 150 MG 90 capsule 1  . guaiFENesin-codeine (ROBITUSSIN AC) 100-10 MG/5ML syrup Take 5-10 mLs by mouth at bedtime as needed for cough. (Patient not taking: Reported on 07/09/2018) 60 mL 0  . ibuprofen (ADVIL) 800 MG tablet Take 1 tablet (800 mg total) by mouth daily as needed for moderate pain. 30 tablet 3  . lidocaine (LIDODERM) 5 % Place 1 patch onto the skin daily. Remove & Discard patch within  12 hours or as directed by MD 30 patch 2  . lidocaine (XYLOCAINE) 5 % ointment Apply 1 application topically 4 (four) times daily as needed for moderate pain. Maximum dose: 5 g/application (approximately 6 inches of ointment); 20 g/day 35.44 g 2  . mometasone (NASONEX) 50 MCG/ACT nasal spray Place 2 sprays into the nose daily. 17 g 12  . Multiple Vitamin (MULTIVITAMIN WITH MINERALS) TABS tablet Take 1 tablet by mouth daily.    . Multiple Vitamins-Minerals (HAIR SKIN AND NAILS FORMULA) TABS Take 1 tablet by mouth daily.    Marland Kitchen oxyCODONE-acetaminophen (PERCOCET) 10-325 MG tablet Take 1 tablet by mouth every 8 (eight) hours as needed for pain. Must last 30 days. 90 tablet 0  . pravastatin (PRAVACHOL) 10 MG tablet TAKE 1 TABLET BY MOUTH EVERYDAY AT BEDTIME 90 tablet 1  . propranolol (INDERAL) 10 MG tablet TAKE 1 TABLET (10 MG TOTAL) BY MOUTH 2 (TWO) TIMES DAILY AS NEEDED FOR SEVERE ANXIETY 180 tablet 1  . QUEtiapine (SEROQUEL) 100 MG tablet Take 1.5 tablets (150 mg total) by mouth at bedtime. For mood and sleep 45 tablet 1  . QUEtiapine (SEROQUEL) 25 MG tablet Take 1 tablet (25 mg total) by mouth daily as needed. For severe anxiety agitation 30 tablet 1  . traZODone (DESYREL) 100 MG tablet TAKE 1.5 TABLETS (150 MG TOTAL) BY MOUTH AT BEDTIME. 135 tablet 0  . venlafaxine XR (EFFEXOR-XR) 150 MG 24 hr capsule TAKE 1  CAPSULE (150 MG TOTAL) BY MOUTH DAILY WITH BREAKFAST. 90 capsule 1  . VENTOLIN HFA 108 (90 Base) MCG/ACT inhaler TAKE 2 PUFFS BY MOUTH EVERY 6 HOURS AS NEEDED FOR WHEEZE OR SHORTNESS OF BREATH 18 g 0   No current facility-administered medications for this visit.      Musculoskeletal: Strength & Muscle Tone: UTA Gait & Station: normal Patient leans: N/A  Psychiatric Specialty Exam: Review of Systems  Musculoskeletal: Positive for back pain.       Sciatica  Psychiatric/Behavioral: The patient is nervous/anxious and has insomnia.   All other systems reviewed and are negative.   Last menstrual period 02/12/2011.There is no height or weight on file to calculate BMI.  General Appearance: Casual  Eye Contact:  Fair  Speech:  Clear and Coherent  Volume:  Normal  Mood:  Anxious  Affect:  Congruent  Thought Process:  Goal Directed and Descriptions of Associations: Intact  Orientation:  Full (Time, Place, and Person)  Thought Content: Logical   Suicidal Thoughts:  No  Homicidal Thoughts:  No  Memory:  Immediate;   Fair Recent;   Fair Remote;   Fair  Judgement:  Fair  Insight:  Fair  Psychomotor Activity:  Normal  Concentration:  Concentration: Fair and Attention Span: Fair  Recall:  [employer] of Knowledge: Fair  Language: Fair  Akathisia:  No  Handed:  Right  AIMS (if indicated): Denies tremors, rigidity  Assets:  Communication Skills Desire for Improvement Housing Social Support  ADL's:  Intact  Cognition: WNL  Sleep:  Poor   Screenings: GAD-7     Office Visit from 11/09/2017 in [place] at [facility]  Total GAD-7 Score  16    PHQ2-9     Procedure visit from 08/12/2018 in [place] Procedure visit from 06/12/2018 in [place] Office Visit from 04/04/2018 in [place] Procedure visit from 01/30/2018 in [place]  Clinical Support from 01/24/2018 in [facility]  PHQ-2 Total Score  1  0  0  0  0       Assessment and Plan: Tammy Lang is a 53 year old Hispanic female who has a history of depression, anxiety, insomnia, hyperlipidemia was evaluated by telemedicine today.  Patient is biologically predisposed given her family history.  She also has psychosocial stressors of her recent relocation, financial problems, son's legal issues and substance abuse problems.  Patient continues to struggle with mood lability and sleep problems more so because of her pain.  Plan as noted below.  Plan MDD-Improving Venlafaxine XR 187.5 mg p.o. daily Increase Seroquel to 150 mg p.o. nightly   GAD- some progress Venlafaxine as prescribed Seroquel 25 mg p.o. daily PRN for severe anxiety symptoms Pranau 10 mg p.o. twice daily as needed for anxiety attacks  Insomnia-unstable mostly due to pain Seroquel increased to 150 mg p.o. nightly She also has trazodone available.  Tobacco use disorder-unstable Provided smoking cessation counseling.  Patient advised to establish care with a primary care provider.  She was also advised to call her health insurance plan to get a list of mental health facilities and outpatient clinics close to where she lives.  Writer also provided her information in the community.  Advised patient to establish care with a psychiatrist and a therapist close to where she lives.  Follow-up in clinic if she is unable to find a therapist or psychiatrist within the next few weeks.  November 12 at 4:30 PM  I have spent atleast 15 minutes non face to face with patient today. More than 50 % of the time was spent for psychoeducation and supportive psychotherapy and care coordination. This note was generated in part or whole with voice recognition software. Voice recognition is usually quite accurate but there are transcription errors that can  and very often do occur. I apologize for any typographical errors that were not detected and corrected.         Ursula Alert, MD 10/23/2018, 5:30 PM

## 2018-10-26 ENCOUNTER — Other Ambulatory Visit: Payer: Self-pay | Admitting: Psychiatry

## 2018-10-26 DIAGNOSIS — F5105 Insomnia due to other mental disorder: Secondary | ICD-10-CM

## 2018-10-31 ENCOUNTER — Other Ambulatory Visit: Payer: Self-pay

## 2018-10-31 ENCOUNTER — Ambulatory Visit: Payer: 59 | Admitting: Licensed Clinical Social Worker

## 2018-10-31 ENCOUNTER — Encounter: Payer: 59 | Admitting: Student in an Organized Health Care Education/Training Program

## 2018-11-11 ENCOUNTER — Telehealth: Payer: Self-pay | Admitting: Student in an Organized Health Care Education/Training Program

## 2018-11-11 NOTE — Telephone Encounter (Signed)
[place] out of [place], she has been to see new pain clinic one time and the doctor there wont do anything about her meds until 11-19-18. Patient wants to know if Dr. Holley Raring can cover her meds one more time. She is out and says she does not feel well at all. Please call patient and discuss options with her.

## 2018-11-14 ENCOUNTER — Other Ambulatory Visit: Payer: Self-pay | Admitting: Psychiatry

## 2018-11-14 DIAGNOSIS — F330 Major depressive disorder, recurrent, mild: Secondary | ICD-10-CM

## 2018-11-14 DIAGNOSIS — F411 Generalized anxiety disorder: Secondary | ICD-10-CM

## 2018-11-14 DIAGNOSIS — G4701 Insomnia due to medical condition: Secondary | ICD-10-CM

## 2018-11-21 ENCOUNTER — Other Ambulatory Visit: Payer: Self-pay

## 2018-11-21 ENCOUNTER — Telehealth: Payer: Self-pay

## 2018-11-21 ENCOUNTER — Encounter: Payer: Self-pay | Admitting: Psychiatry

## 2018-11-21 ENCOUNTER — Ambulatory Visit (INDEPENDENT_AMBULATORY_CARE_PROVIDER_SITE_OTHER): Payer: 59 | Admitting: Psychiatry

## 2018-11-21 ENCOUNTER — Ambulatory Visit (INDEPENDENT_AMBULATORY_CARE_PROVIDER_SITE_OTHER): Payer: 59 | Admitting: Licensed Clinical Social Worker

## 2018-11-21 DIAGNOSIS — F5105 Insomnia due to other mental disorder: Secondary | ICD-10-CM

## 2018-11-21 DIAGNOSIS — F411 Generalized anxiety disorder: Secondary | ICD-10-CM

## 2018-11-21 DIAGNOSIS — F17200 Nicotine dependence, unspecified, uncomplicated: Secondary | ICD-10-CM

## 2018-11-21 DIAGNOSIS — F330 Major depressive disorder, recurrent, mild: Secondary | ICD-10-CM | POA: Diagnosis not present

## 2018-11-21 MED ORDER — ZOLPIDEM TARTRATE 10 MG PO TABS: 5.0000 mg | ORAL_TABLET | Freq: Every evening | ORAL | 0 refills | Status: DC | PRN

## 2018-11-21 NOTE — Progress Notes (Signed)
Virtual Visit via Video Note  I connected with Tammy Lang on 11/21/18 at  4:30 PM EST by a video enabled telemedicine application and verified that I am speaking with the correct person using two identifiers.   I discussed the limitations of evaluation and management by telemedicine and the availability of in person appointments. The patient expressed understanding and agreed to proceed.   I discussed the assessment and treatment plan with the patient. The patient was provided an opportunity to ask questions and all were answered. The patient agreed with the plan and demonstrated an understanding of the instructions.   The patient was advised to call back or seek an in-person evaluation if the symptoms worsen or if the condition fails to improve as anticipated.  [place] MD OP Progress Note  11/21/2018 5:05 PM Tammy Lang  MRN:  [id number]  Chief Complaint:  Chief Complaint    Follow-up     HPI: Tammy Lang is a 53 year old Hispanic female, married, unemployed, lives in [place], has a history of depression, anxiety, chronic pain, hyperlipidemia was evaluated by telemedicine today.  Patient today reports she continues to struggle with several psychosocial stressors including recent relocation, financial problems, her own health issues.  She is in pain and is currently trying to get the right help for her sciatica.  Patient reports she has been struggling with sleep problems since the past few days.  She reports she tried taking Seroquel as well as trazodone and none of those medications helped.  She reports she has been up the past 3 days and wants to get some sleep.  She has tried Ambien in the past which may have helped.  Patient continues to be compliant on venlafaxine which helps to some extent with her anxiety.  Patient was advised to start psychotherapy sessions with therapist-she is still trying to get established at this time.  Patient denies any suicidality,  homicidality or perceptual disturbances.   Visit Diagnosis:    ICD-10-CM   1. GAD (generalized anxiety disorder)  F41.1   2. MDD (major depressive disorder), recurrent episode, mild ([place])  F33.0   3. Tobacco use disorder  F17.200   4. Insomnia due to mental condition  F51.05 zolpidem (AMBIEN) 10 MG tablet    Past Psychiatric History: Reviewed past psychiatric history from my progress note on 12/26/2017.  Past trials of Wellbutrin, Cymbalta, venlafaxine, hydroxyzine, trazodone, Xanax.    Past Medical History:  Past Medical History:  Diagnosis Date  . Allergy   . Anxiety   . Arthritis   . Asthma   . Lipoma of chest wall 03/23/2017  . Spinal stenosis of cervical region     Past Surgical History:  Procedure Laterality Date  . BREAST SURGERY Right    benign tumor  . BUNIONECTOMY    . CESAREAN SECTION    . DILATION AND CURETTAGE OF UTERUS    . GANGLION CYST EXCISION    . MASS EXCISION Left 03/23/2017   Procedure: EXCISION OF LEFT CHEST WALL MASS;  Surgeon: Fanny Skates, MD;  Location: [facility] ORS;  Service: General;  Laterality: Left;    Family Psychiatric History: I have reviewed family psychiatric history from my progress note on 12/26/2017.  Family History:  Family History  Problem Relation Age of Onset  . Alcohol abuse Mother   . Cervical cancer Mother   . Hodgkin's lymphoma Sister   . ADD / ADHD Son   . Drug abuse Son     Social History: Reviewed social history from my  progress note on 12/26/2017. Social History   Socioeconomic History  . Marital status: Married    Spouse name: vincent  . Number of children: 2  . Years of education: Not on file  . Highest education level: High school graduate  Occupational History  . Not on file  Social Needs  . Financial resource strain: Not hard at all  . Food insecurity    Worry: Never true    Inability: Never true  . Transportation needs    Medical: No    Non-medical: No  Tobacco Use  . Smoking status: Current  Every Day Smoker    Packs/day: 0.25    Years: 30.00    Pack years: 7.50    Types: Cigarettes    Last attempt to quit: 06/04/2018    Years since quitting: 0.4  . Smokeless tobacco: Never Used  Substance and Sexual Activity  . Alcohol use: Yes    Comment: occasional  . Drug use: No  . Sexual activity: Yes    Birth control/protection: Surgical  Lifestyle  . Physical activity    Days per week: 0 days    Minutes per session: 0 min  . Stress: Very much  Relationships  . Social Herbalist on phone: Not on file    Gets together: Not on file    Attends religious service: Never    Active member of club or organization: No    Attends meetings of clubs or organizations: Never    Relationship status: Married  Other Topics Concern  . Not on file  Social History Narrative  . Not on file    Allergies:  Allergies  Allergen Reactions  . Duloxetine     Raises BP  . Vistaril [Hydroxyzine Hcl]     Hands go numb    Metabolic Disorder Labs: No results found for: HGBA1C, MPG No results found for: PROLACTIN Lab Results  Component Value Date   CHOL 243 (H) 05/07/2018   TRIG 166.0 (H) 05/07/2018   HDL 73.10 05/07/2018   CHOLHDL 3 05/07/2018   VLDL 33.2 05/07/2018   LDLCALC 137 (H) 05/07/2018   LDLCALC 149 (H) 01/25/2018   No results found for: TSH  Therapeutic Level Labs: No results found for: LITHIUM No results found for: VALPROATE No components found for:  CBMZ  Current Medications: Current Outpatient Medications  Medication Sig Dispense Refill  . benzonatate (TESSALON) 100 MG capsule Take 1-2 capsules (100-200 mg total) by mouth 3 (three) times daily as needed. 30 capsule 0  . EFFEXOR XR 37.5 MG 24 hr capsule TAKE 1 CAPSULE (37.5 MG TOTAL) BY MOUTH DAILY WITH BREAKFAST. TO BE COMBINED WITH 150 MG 90 capsule 1  . guaiFENesin-codeine (ROBITUSSIN AC) 100-10 MG/5ML syrup Take 5-10 mLs by mouth at bedtime as needed for cough. (Patient not taking: Reported on 07/09/2018)  60 mL 0  . ibuprofen (ADVIL) 800 MG tablet Take 1 tablet (800 mg total) by mouth daily as needed for moderate pain. 30 tablet 3  . lidocaine (LIDODERM) 5 % Place 1 patch onto the skin daily. Remove & Discard patch within 12 hours or as directed by MD 30 patch 2  . lidocaine (XYLOCAINE) 5 % ointment Apply 1 application topically 4 (four) times daily as needed for moderate pain. Maximum dose: 5 g/application (approximately 6 inches of ointment); 20 g/day 35.44 g 2  . mometasone (NASONEX) 50 MCG/ACT nasal spray Place 2 sprays into the nose daily. 17 g 12  . Multiple Vitamin (MULTIVITAMIN  WITH MINERALS) TABS tablet Take 1 tablet by mouth daily.    . Multiple Vitamins-Minerals (HAIR SKIN AND NAILS FORMULA) TABS Take 1 tablet by mouth daily.    Marland Kitchen oxyCODONE-acetaminophen (PERCOCET) 10-325 MG tablet TAKE 1 TABLET BY MOUTH EVERY MORNING, 1 TAB IN EVENING, & 1 TAB AT NIGHT    . pravastatin (PRAVACHOL) 10 MG tablet TAKE 1 TABLET BY MOUTH EVERYDAY AT BEDTIME 90 tablet 1  . pregabalin (LYRICA) 150 MG capsule     . propranolol (INDERAL) 10 MG tablet TAKE 1 TABLET (10 MG TOTAL) BY MOUTH 2 (TWO) TIMES DAILY AS NEEDED FOR SEVERE ANXIETY 180 tablet 1  . venlafaxine XR (EFFEXOR-XR) 150 MG 24 hr capsule TAKE 1 CAPSULE (150 MG TOTAL) BY MOUTH DAILY WITH BREAKFAST. 90 capsule 1  . VENTOLIN HFA 108 (90 Base) MCG/ACT inhaler TAKE 2 PUFFS BY MOUTH EVERY 6 HOURS AS NEEDED FOR WHEEZE OR SHORTNESS OF BREATH 18 g 0  . zolpidem (AMBIEN) 10 MG tablet Take 0.5-1 tablets (5-10 mg total) by mouth at bedtime as needed for sleep. 30 tablet 0   No current facility-administered medications for this visit.      Musculoskeletal: Strength & Muscle Tone: UTA Gait & Station: normal Patient leans: N/A  Psychiatric Specialty Exam: Review of Systems  Musculoskeletal: Positive for back pain and joint pain.  Psychiatric/Behavioral: Positive for depression. The patient has insomnia.   All other systems reviewed and are negative.    Last menstrual period 02/12/2011.There is no height or weight on file to calculate BMI.  General Appearance: Casual  Eye Contact:  Fair  Speech:  Clear and Coherent  Volume:  Normal  Mood:  Anxious and Depressed  Affect:  Congruent  Thought Process:  Goal Directed and Descriptions of Associations: Intact  Orientation:  Full (Time, Place, and Person)  Thought Content: Logical   Suicidal Thoughts:  No  Homicidal Thoughts:  No  Memory:  Immediate;   Fair Recent;   Fair Remote;   Fair  Judgement:  Fair  Insight:  Fair  Psychomotor Activity:  Normal  Concentration:  Concentration: Fair and Attention Span: Fair  Recall:  [employer] of Knowledge: Fair  Language: Fair  Akathisia:  No  Handed:  Right  AIMS (if indicated): uta  Assets:  Communication Skills Desire for Improvement Housing Social Support  ADL's:  Intact  Cognition: WNL  Sleep:  Poor   Screenings: GAD-7     Office Visit from 11/09/2017 in [place] at [facility]  Total GAD-7 Score  16    PHQ2-9     Procedure visit from 08/12/2018 in [place] Procedure visit from 06/12/2018 in [place] Office Visit from 04/04/2018 in [place] Procedure visit from 01/30/2018 in [place] from 01/24/2018 in [place]  PHQ-2 Total Score  1  0  0  0  0       Assessment and Plan: Lakishia is a 53 year old Hispanic female who has a history of depression, anxiety, insomnia, hyperlipidemia was evaluated by telemedicine today.  Patient is biologically predisposed given her family history.  She also has psychosocial stressors of recent relocation, financial problems, son's legal issues and substance abuse problems.  Patient continues to struggle with sleep problems and will benefit from  medication readjustment.  Plan as noted below.  Plan MDD-improving Venlafaxine XR 187.5 mg  p.o. daily Discontinue Seroquel  GAD-some progress Venlafaxine as prescribed Discontinue Seroquel. Propranolol 10 mg p.o. twice daily as needed for anxiety attacks  Insomnia-unstable mostly due to pain Will discontinue Seroquel and trazodone. Start Ambien 5 to 10 mg p.o. nightly as needed   Tobacco use disorder-unstable Provided smoking cessation counseling.  Follow-up in clinic in 3 to 4 weeks or sooner if needed.  December 9 at 3:40 PM  I have spent atleast 15 minutes non face to face with patient today. More than 50 % of the time was spent for psychoeducation and supportive psychotherapy and care coordination. This note was generated in part or whole with voice recognition software. Voice recognition is usually quite accurate but there are transcription errors that can and very often do occur. I apologize for any typographical errors that were not detected and corrected.     Ursula Alert, MD 11/21/2018, 5:05 PM

## 2018-11-21 NOTE — Telephone Encounter (Signed)
pharmacy called wanted to speak with dr Shea Evans because they needs to clarify medication and make you aware of other medication that patient is taking that could interact with your medication prescribed. [phone]

## 2018-11-21 NOTE — Telephone Encounter (Signed)
Spoke to pharmacist

## 2018-11-24 NOTE — Progress Notes (Signed)
   THERAPIST PROGRESS NOTE  Session Time: 80min  Participation Level: Active  Type of Therapy: Individual Therapy  Interventions: CBT and Motivational Interviewing  Summary: Tammy Lang is a 53 y.o. female who presents with continued symptoms of diagnosis.  Therapist reviewed with Patient her goals.  Therapist actively listened as Patient reports being able to make progress toward her goals.  Therapist reviewed with Patient build social supports.  Therapist discussed with Patient the importance of social support.  Therpist utilized [employer] Topic 4: Building Social Supports to assisted Patient with strategies for getting closer to people.  Therapist role-played with Patient how to develop closer relationships.  Telephone call was disconnected.  Therapist attempted to call back 2 times.  No response.  Suicidal/Homicidal: No  Plan: Return again in 2 weeks.  Diagnosis: Axis I: Generalized Anxiety Disorder    Axis II: No diagnosis    Lubertha [place], LCSW 11/21/2018

## 2018-12-18 ENCOUNTER — Encounter: Payer: Self-pay | Admitting: Psychiatry

## 2018-12-18 ENCOUNTER — Other Ambulatory Visit: Payer: Self-pay

## 2018-12-18 ENCOUNTER — Ambulatory Visit (INDEPENDENT_AMBULATORY_CARE_PROVIDER_SITE_OTHER): Payer: 59 | Admitting: Psychiatry

## 2018-12-18 DIAGNOSIS — F330 Major depressive disorder, recurrent, mild: Secondary | ICD-10-CM | POA: Diagnosis not present

## 2018-12-18 DIAGNOSIS — F17200 Nicotine dependence, unspecified, uncomplicated: Secondary | ICD-10-CM

## 2018-12-18 DIAGNOSIS — F411 Generalized anxiety disorder: Secondary | ICD-10-CM

## 2018-12-18 DIAGNOSIS — F5105 Insomnia due to other mental disorder: Secondary | ICD-10-CM | POA: Diagnosis not present

## 2018-12-18 DIAGNOSIS — Z79899 Other long term (current) drug therapy: Secondary | ICD-10-CM

## 2018-12-18 MED ORDER — DOXEPIN HCL 10 MG PO CAPS: 10.0000 mg | ORAL_CAPSULE | Freq: Every evening | ORAL | 1 refills | Status: DC | PRN

## 2018-12-18 NOTE — Progress Notes (Signed)
Virtual Visit via Video Note  I connected with Tammy Lang on 12/18/18 at  3:40 PM EST by a video enabled telemedicine application and verified that I am speaking with the correct person using two identifiers.   I discussed the limitations of evaluation and management by telemedicine and the availability of in person appointments. The patient expressed understanding and agreed to proceed.    I discussed the assessment and treatment plan with the patient. The patient was provided an opportunity to ask questions and all were answered. The patient agreed with the plan and demonstrated an understanding of the instructions.   The patient was advised to call back or seek an in-person evaluation if the symptoms worsen or if the condition fails to improve as anticipated.   [place] MD OP Progress Note  12/18/2018 5:11 PM Tammy Lang  MRN:  [id number]  Chief Complaint:  Chief Complaint    Follow-up     HPI: Tramanh is a 53 year old Hispanic female, married, unemployed, lives in [address], has a history of depression, anxiety, insomnia, tobacco use disorder, chronic pain, hyperlipidemia was evaluated by telemedicine today.  Patient reports she continues to struggle with anxiety symptoms, she reports it is more so because of her pain and her inability to find the right provider to manage her pain.  She reports her recent provider put a cap on her prescription of opiate medication and she was not very happy about it.  She reports she does not know why he did that.  Patient seemed to be very upset when she talked about this in session.  Patient reports she continues to struggle with sleep problems.  She reports the Ambien helps to some extent however she does not feel rested when she wakes up in the morning.  She also has been talking and acting out in sleep per her daughter.  Patient reports she is compliant on her venlafaxine.  Patient denies any suicidality, homicidality or perceptual  disturbances.  Patient denies any other concerns today.   Visit Diagnosis:    ICD-10-CM   1. GAD (generalized anxiety disorder)  F41.1   2. MDD (major depressive disorder), recurrent episode, mild ([place])  F33.0   3. Insomnia due to mental condition  F51.05 doxepin (SINEQUAN) 10 MG capsule  4. Tobacco use disorder  F17.200   5. High risk medication use  Z79.899 EKG 12-Lead    Past Psychiatric History: I have reviewed past psychiatric history from my progress note on 12/26/2017.  Past trials of Wellbutrin, Cymbalta, venlafaxine, hydroxyzine, trazodone, Xanax  Past Medical History:  Past Medical History:  Diagnosis Date  . Allergy   . Anxiety   . Arthritis   . Asthma   . Lipoma of chest wall 03/23/2017  . Spinal stenosis of cervical region     Past Surgical History:  Procedure Laterality Date  . BREAST SURGERY Right    benign tumor  . BUNIONECTOMY    . CESAREAN SECTION    . DILATION AND CURETTAGE OF UTERUS    . GANGLION CYST EXCISION    . MASS EXCISION Left 03/23/2017   Procedure: EXCISION OF LEFT CHEST WALL MASS;  Surgeon: Fanny Skates, MD;  Location: [facility] ORS;  Service: General;  Laterality: Left;    Family Psychiatric History: I have reviewed family psychiatric history from my progress note on 12/26/2017.  Family History:  Family History  Problem Relation Age of Onset  . Alcohol abuse Mother   . Cervical cancer Mother   . Hodgkin's lymphoma Sister   .  ADD / ADHD Son   . Drug abuse Son     Social History: I have reviewed social history from my progress note on 12/26/2017. Social History   Socioeconomic History  . Marital status: Married    Spouse name: vincent  . Number of children: 2  . Years of education: Not on file  . Highest education level: High school graduate  Occupational History  . Not on file  Social Needs  . Financial resource strain: Not hard at all  . Food insecurity    Worry: Never true    Inability: Never true  . Transportation needs     Medical: No    Non-medical: No  Tobacco Use  . Smoking status: Current Every Day Smoker    Packs/day: 0.25    Years: 30.00    Pack years: 7.50    Types: Cigarettes    Last attempt to quit: 06/04/2018    Years since quitting: 0.5  . Smokeless tobacco: Never Used  Substance and Sexual Activity  . Alcohol use: Yes    Comment: occasional  . Drug use: No  . Sexual activity: Yes    Birth control/protection: Surgical  Lifestyle  . Physical activity    Days per week: 0 days    Minutes per session: 0 min  . Stress: Very much  Relationships  . Social Herbalist on phone: Not on file    Gets together: Not on file    Attends religious service: Never    Active member of club or organization: No    Attends meetings of clubs or organizations: Never    Relationship status: Married  Other Topics Concern  . Not on file  Social History Narrative  . Not on file    Allergies:  Allergies  Allergen Reactions  . Duloxetine     Raises BP  . Vistaril [Hydroxyzine Hcl]     Hands go numb    Metabolic Disorder Labs: No results found for: HGBA1C, MPG No results found for: PROLACTIN Lab Results  Component Value Date   CHOL 243 (H) 05/07/2018   TRIG 166.0 (H) 05/07/2018   HDL 73.10 05/07/2018   CHOLHDL 3 05/07/2018   VLDL 33.2 05/07/2018   LDLCALC 137 (H) 05/07/2018   LDLCALC 149 (H) 01/25/2018   No results found for: TSH  Therapeutic Level Labs: No results found for: LITHIUM No results found for: VALPROATE No components found for:  CBMZ  Current Medications: Current Outpatient Medications  Medication Sig Dispense Refill  . EFFEXOR XR 37.5 MG 24 hr capsule TAKE 1 CAPSULE (37.5 MG TOTAL) BY MOUTH DAILY WITH BREAKFAST. TO BE COMBINED WITH 150 MG 90 capsule 1  . ibuprofen (ADVIL) 800 MG tablet Take 1 tablet (800 mg total) by mouth daily as needed for moderate pain. 30 tablet 3  . lidocaine (LIDODERM) 5 % Place 1 patch onto the skin daily. Remove & Discard patch within 12  hours or as directed by MD 30 patch 2  . mometasone (NASONEX) 50 MCG/ACT nasal spray Place 2 sprays into the nose daily. 17 g 12  . Multiple Vitamin (MULTIVITAMIN WITH MINERALS) TABS tablet Take 1 tablet by mouth daily.    Marland Kitchen oxyCODONE-acetaminophen (PERCOCET) 10-325 MG tablet TAKE 1 TABLET BY MOUTH EVERY MORNING, 1 TAB IN EVENING, & 1 TAB AT NIGHT    . pravastatin (PRAVACHOL) 10 MG tablet TAKE 1 TABLET BY MOUTH EVERYDAY AT BEDTIME 90 tablet 1  . venlafaxine XR (EFFEXOR-XR) 150 MG 24  hr capsule TAKE 1 CAPSULE (150 MG TOTAL) BY MOUTH DAILY WITH BREAKFAST. 90 capsule 1  . VENTOLIN HFA 108 (90 Base) MCG/ACT inhaler TAKE 2 PUFFS BY MOUTH EVERY 6 HOURS AS NEEDED FOR WHEEZE OR SHORTNESS OF BREATH 18 g 0  . benzonatate (TESSALON) 100 MG capsule Take 1-2 capsules (100-200 mg total) by mouth 3 (three) times daily as needed. (Patient not taking: Reported on 12/18/2018) 30 capsule 0  . doxepin (SINEQUAN) 10 MG capsule Take 1-2 capsules (10-20 mg total) by mouth at bedtime as needed. FOR SLEEP 60 capsule 1  . guaiFENesin-codeine (ROBITUSSIN AC) 100-10 MG/5ML syrup Take 5-10 mLs by mouth at bedtime as needed for cough. (Patient not taking: Reported on 07/09/2018) 60 mL 0  . Multiple Vitamins-Minerals (HAIR SKIN AND NAILS FORMULA) TABS Take 1 tablet by mouth daily.    . pregabalin (LYRICA) 150 MG capsule     . propranolol (INDERAL) 10 MG tablet TAKE 1 TABLET (10 MG TOTAL) BY MOUTH 2 (TWO) TIMES DAILY AS NEEDED FOR SEVERE ANXIETY (Patient not taking: Reported on 12/18/2018) 180 tablet 1   No current facility-administered medications for this visit.      Musculoskeletal: Strength & Muscle Tone: UTA Gait & Station: normal Patient leans: N/A  Psychiatric Specialty Exam: Review of Systems  Musculoskeletal: Positive for back pain, joint pain, myalgias and neck pain.  Psychiatric/Behavioral: The patient is nervous/anxious and has insomnia.   All other systems reviewed and are negative.   Last menstrual period  02/12/2011.There is no height or weight on file to calculate BMI.  General Appearance: Casual  Eye Contact:  Fair  Speech:  Clear and Coherent  Volume:  Normal  Mood:  Anxious  Affect:  Congruent  Thought Process:  Goal Directed and Descriptions of Associations: Intact  Orientation:  Full (Time, Place, and Person)  Thought Content: Logical   Suicidal Thoughts:  No  Homicidal Thoughts:  No  Memory:  Immediate;   Fair Recent;   Fair Remote;   Fair  Judgement:  Fair  Insight:  Fair  Psychomotor Activity:  Normal  Concentration:  Concentration: Fair and Attention Span: Fair  Recall:  [employer] of Knowledge: Fair  Language: Fair  Akathisia:  No  Handed:  Right  AIMS (if indicated): uta  Assets:  Communication Skills Desire for Improvement Social Support  ADL's:  Intact  Cognition: WNL  Sleep:  Poor   Screenings: GAD-7     Office Visit from 11/09/2017 in [place] at [facility]  Total GAD-7 Score  16    PHQ2-9     Procedure visit from 08/12/2018 in [place] Procedure visit from 06/12/2018 in [place] Office Visit from 04/04/2018 in [place] Procedure visit from 01/30/2018 in [place] from 01/24/2018 in [place]  PHQ-2 Total Score  1  0  0  0  0       Assessment and Plan: Nitzia is a 53 year old Hispanic female, who has a history of depression, anxiety, insomnia, hyperlipidemia was evaluated by telemedicine today.  She is biologically predisposed given her family history.  She also has psychosocial stressors of recent relocation, financial problems, son's legal issues, substance abuse problems and her own chronic pain.  Patient continues to struggle with sleep problems and will benefit from medication readjustment.  Plan as  noted below.  Plan  MDD-improving Venlafaxine XR 187.5 mg p.o. daily.  GAD-improving Venlafaxine as prescribed Propanolol 10 mg p.o. twice daily as needed for anxiety attacks  Insomnia-unstable Patient continues to struggle with pain which also affects her sleep. Discontinue Ambien. Start doxepin 10 to 20 mg p.o. nightly as needed Also discussed melatonin as needed  Discussed with patient the need for psychotherapy sessions.  Also advised her that she needs to establish care with a provider close to where she lives now in [place].  Patient reports she has upcoming appointment with her primary care provider and she will ask for a referral.  Discussed with patient that she can be seen once more for a follow-up session and after that she will be given 30 to 60-day supply of medications and that she needs to find a provider close to where she lives.  Will order EKG since she is currently on doxepin.  Follow-up in clinic in 4 weeks or sooner if needed.  January 14 at 3:40 PM  I have spent atleast 15 minutes non face to face with patient today. More than 50 % of the time was spent for psychoeducation and supportive psychotherapy and care coordination. This note was generated in part or whole with voice recognition software. Voice recognition is usually quite accurate but there are transcription errors that can and very often do occur. I apologize for any typographical errors that were not detected and corrected.       Ursula Alert, MD 12/18/2018, 5:11 PM

## 2019-01-10 ENCOUNTER — Other Ambulatory Visit: Payer: Self-pay | Admitting: Psychiatry

## 2019-01-10 DIAGNOSIS — F5105 Insomnia due to other mental disorder: Secondary | ICD-10-CM

## 2019-01-14 ENCOUNTER — Other Ambulatory Visit: Payer: Self-pay | Admitting: Student in an Organized Health Care Education/Training Program

## 2019-01-23 ENCOUNTER — Encounter: Payer: Self-pay | Admitting: Psychiatry

## 2019-01-23 ENCOUNTER — Other Ambulatory Visit: Payer: Self-pay

## 2019-01-23 ENCOUNTER — Ambulatory Visit (INDEPENDENT_AMBULATORY_CARE_PROVIDER_SITE_OTHER): Payer: 59 | Admitting: Psychiatry

## 2019-01-23 DIAGNOSIS — F17200 Nicotine dependence, unspecified, uncomplicated: Secondary | ICD-10-CM | POA: Diagnosis not present

## 2019-01-23 DIAGNOSIS — F5105 Insomnia due to other mental disorder: Secondary | ICD-10-CM

## 2019-01-23 DIAGNOSIS — F411 Generalized anxiety disorder: Secondary | ICD-10-CM | POA: Diagnosis not present

## 2019-01-23 DIAGNOSIS — Z79899 Other long term (current) drug therapy: Secondary | ICD-10-CM

## 2019-01-23 DIAGNOSIS — F330 Major depressive disorder, recurrent, mild: Secondary | ICD-10-CM | POA: Diagnosis not present

## 2019-01-23 NOTE — Progress Notes (Signed)
Virtual Visit via Video Note  I connected with Tammy Lang on 01/23/19 at  3:40 PM EST by a video enabled telemedicine application and verified that I am speaking with the correct person using two identifiers.   I discussed the limitations of evaluation and management by telemedicine and the availability of in person appointments. The patient expressed understanding and agreed to proceed.    I discussed the assessment and treatment plan with the patient. The patient was provided an opportunity to ask questions and all were answered. The patient agreed with the plan and demonstrated an understanding of the instructions.   The patient was advised to call back or seek an in-person evaluation if the symptoms worsen or if the condition fails to improve as anticipated.  [place] MD OP Progress Note  01/23/2019 5:03 PM Dimple Williquette  MRN:  [id number]  Chief Complaint:  Chief Complaint    Follow-up     HPI: Tammy Lang is a 54 year old Hispanic female, married, unemployed, lives in [place], has a history of depression, anxiety, insomnia, tobacco use disorder, chronic pain, hyperlipidemia was evaluated by telemedicine today.  Patient today reports she continues to struggle with relationship struggles with her son who is currently having legal issues and substance abuse problems.  Patient reports that is a constant stressor in her life.  She however is trying her best to cope with it.  She reports sleep is improved on the doxepin.  It does also help to some extent with her pain.  She is tolerating the venlafaxine well.  This dosage does help with her anxiety and mood symptoms.  Patient denies any suicidality, homicidality or perceptual disturbances.  Patient reports she is going to establish care with a new primary care provider on 18th January.  She reports her medications will be managed by her primary care provider here onwards since she is currently in [place] ,[place].  Patient denies any other concerns today. Visit Diagnosis:    ICD-10-CM   1. GAD (generalized anxiety disorder)  F41.1   2. MDD (major depressive disorder), recurrent episode, mild ([place])  F33.0   3. Insomnia due to mental condition  F51.05   4. Tobacco use disorder  F17.200   5. High risk medication use  Z79.899     Past Psychiatric History: I have reviewed past psychiatric history from my progress note on 12/26/2017.  Past trials of Wellbutrin, Cymbalta, venlafaxine, hydroxyzine, trazodone, Xanax.  Past Medical History:  Past Medical History:  Diagnosis Date  . Allergy   . Anxiety   . Arthritis   . Asthma   . Lipoma of chest wall 03/23/2017  . Spinal stenosis of cervical region     Past Surgical History:  Procedure Laterality Date  . BREAST SURGERY Right    benign tumor  . BUNIONECTOMY    . CESAREAN SECTION    . DILATION AND CURETTAGE OF UTERUS    . GANGLION CYST EXCISION    . MASS EXCISION Left 03/23/2017   Procedure: EXCISION OF LEFT CHEST WALL MASS;  Surgeon: Fanny Skates, MD;  Location: [facility] ORS;  Service: General;  Laterality: Left;    Family Psychiatric History: I have reviewed family psychiatric history from my progress note on 12/26/2017.  Family History:  Family History  Problem Relation Age of Onset  . Alcohol abuse Mother   . Cervical cancer Mother   . Hodgkin's lymphoma Sister   . ADD / ADHD Son   . Drug abuse Son     Social  History: Reviewed social history from my progress note on 12/26/2017. Social History   Socioeconomic History  . Marital status: Married    Spouse name: Tammy Lang  . Number of children: 2  . Years of education: Not on file  . Highest education level: High school graduate  Occupational History  . Not on file  Tobacco Use  . Smoking status: Current Every Day Smoker    Packs/day: 0.25    Years: 30.00    Pack years: 7.50    Types: Cigarettes    Last attempt to quit: 06/04/2018    Years since quitting: 0.6  .  Smokeless tobacco: Never Used  Substance and Sexual Activity  . Alcohol use: Yes    Comment: occasional  . Drug use: No  . Sexual activity: Yes    Birth control/protection: Surgical  Other Topics Concern  . Not on file  Social History Narrative  . Not on file   Social Determinants of Health   Financial Resource Strain:   . Difficulty of Paying Living Expenses: Not on file  Food Insecurity:   . Worried About Charity fundraiser in the Last Year: Not on file  . Ran Out of Food in the Last Year: Not on file  Transportation Needs:   . Lack of Transportation (Medical): Not on file  . Lack of Transportation (Non-Medical): Not on file  Physical Activity:   . Days of Exercise per Week: Not on file  . Minutes of Exercise per Session: Not on file  Stress:   . Feeling of Stress : Not on file  Social Connections:   . Frequency of Communication with Friends and Family: Not on file  . Frequency of Social Gatherings with Friends and Family: Not on file  . Attends Religious Services: Not on file  . Active Member of Clubs or Organizations: Not on file  . Attends Archivist Meetings: Not on file  . Marital Status: Not on file    Allergies:  Allergies  Allergen Reactions  . Duloxetine     Raises BP  . Vistaril [Hydroxyzine Hcl]     Hands go numb    Metabolic Disorder Labs: No results found for: HGBA1C, MPG No results found for: PROLACTIN Lab Results  Component Value Date   CHOL 243 (H) 05/07/2018   TRIG 166.0 (H) 05/07/2018   HDL 73.10 05/07/2018   CHOLHDL 3 05/07/2018   VLDL 33.2 05/07/2018   LDLCALC 137 (H) 05/07/2018   LDLCALC 149 (H) 01/25/2018   No results found for: TSH  Therapeutic Level Labs: No results found for: LITHIUM No results found for: VALPROATE No components found for:  CBMZ  Current Medications: Current Outpatient Medications  Medication Sig Dispense Refill  . benzonatate (TESSALON) 100 MG capsule Take 1-2 capsules (100-200 mg total) by  mouth 3 (three) times daily as needed. (Patient not taking: Reported on 12/18/2018) 30 capsule 0  . doxepin (SINEQUAN) 10 MG capsule TAKE 1-2 CAPSULES (10-20 MG TOTAL) BY MOUTH AT BEDTIME AS NEEDED. FOR SLEEP 180 capsule 1  . EFFEXOR XR 37.5 MG 24 hr capsule TAKE 1 CAPSULE (37.5 MG TOTAL) BY MOUTH DAILY WITH BREAKFAST. TO BE COMBINED WITH 150 MG 90 capsule 1  . guaiFENesin-codeine (ROBITUSSIN AC) 100-10 MG/5ML syrup Take 5-10 mLs by mouth at bedtime as needed for cough. (Patient not taking: Reported on 07/09/2018) 60 mL 0  . ibuprofen (ADVIL) 800 MG tablet Take 1 tablet (800 mg total) by mouth daily as needed for moderate pain.  30 tablet 3  . lidocaine (LIDODERM) 5 % Place 1 patch onto the skin daily. Remove & Discard patch within 12 hours or as directed by MD 30 patch 2  . mometasone (NASONEX) 50 MCG/ACT nasal spray Place 2 sprays into the nose daily. 17 g 12  . Multiple Vitamin (MULTIVITAMIN WITH MINERALS) TABS tablet Take 1 tablet by mouth daily.    . Multiple Vitamins-Minerals (HAIR SKIN AND NAILS FORMULA) TABS Take 1 tablet by mouth daily.    Marland Kitchen oxyCODONE-acetaminophen (PERCOCET) 10-325 MG tablet TAKE 1 TABLET BY MOUTH EVERY MORNING, 1 TAB IN EVENING, & 1 TAB AT NIGHT    . pravastatin (PRAVACHOL) 10 MG tablet TAKE 1 TABLET BY MOUTH EVERYDAY AT BEDTIME 90 tablet 1  . pregabalin (LYRICA) 150 MG capsule     . propranolol (INDERAL) 10 MG tablet TAKE 1 TABLET (10 MG TOTAL) BY MOUTH 2 (TWO) TIMES DAILY AS NEEDED FOR SEVERE ANXIETY (Patient not taking: Reported on 12/18/2018) 180 tablet 1  . venlafaxine XR (EFFEXOR-XR) 150 MG 24 hr capsule TAKE 1 CAPSULE (150 MG TOTAL) BY MOUTH DAILY WITH BREAKFAST. 90 capsule 1  . VENTOLIN HFA 108 (90 Base) MCG/ACT inhaler TAKE 2 PUFFS BY MOUTH EVERY 6 HOURS AS NEEDED FOR WHEEZE OR SHORTNESS OF BREATH 18 g 0   No current facility-administered medications for this visit.     Musculoskeletal: Strength & Muscle Tone: UTA Gait & Station: normal Patient leans:  N/A  Psychiatric Specialty Exam: Review of Systems  Musculoskeletal: Positive for back pain and myalgias.  Psychiatric/Behavioral: The patient is nervous/anxious.   All other systems reviewed and are negative.   Last menstrual period 02/12/2011.There is no height or weight on file to calculate BMI.  General Appearance: Casual  Eye Contact:  Fair  Speech:  Clear and Coherent  Volume:  Normal  Mood:  Anxious  Affect:  Congruent  Thought Process:  Goal Directed and Descriptions of Associations: Intact  Orientation:  Full (Time, Place, and Person)  Thought Content: Logical   Suicidal Thoughts:  No  Homicidal Thoughts:  No  Memory:  Immediate;   Fair Recent;   Fair Remote;   Fair  Judgement:  Fair  Insight:  Fair  Psychomotor Activity:  Normal  Concentration:  Concentration: Fair and Attention Span: Fair  Recall:  [employer] of Knowledge: Fair  Language: Fair  Akathisia:  No  Handed:  Right  AIMS (if indicated):denies tremors, rigidity  Assets:  Communication Skills Desire for Improvement Housing Social Support  ADL's:  Intact  Cognition: WNL  Sleep:  Improving   Screenings: GAD-7     Office Visit from 11/09/2017 in [place] at [facility]  Total GAD-7 Score  16    PHQ2-9     Procedure visit from 08/12/2018 in [place] Procedure visit from 06/12/2018 in [place] Office Visit from 04/04/2018 in [place] Procedure visit from 01/30/2018 in [place] from 01/24/2018 in [place]  PHQ-2 Total Score  1  0  0  0  0       Assessment and Plan: Tamana is a 54 year old Hispanic female who has a history of depression, anxiety, insomnia, hyperlipidemia was evaluated by telemedicine today.  She is biologically predisposed given her  family history.  She also has psychosocial stressors of recent relocation, financial problems, son's legal issues  and his substance abuse problems and her own chronic pain.  Patient is currently making progress on the current medication regimen.  Plan as noted below.  Plan MDD-improving Venlafaxine XR 187.5 mg p.o. daily  GAD-improving Venlafaxine as prescribed Propranolol 10 mg p.o. twice daily as needed for anxiety attacks  Insomnia-improving Doxepin 10 to 20 mg p.o. nightly as needed  Pending EKG to monitor her cardiac health since she is on medications like doxepin.  Patient reports she is transitioning her care to her primary care provider.  Discussed with patient she can request medical records which can be sent to her new provider.  She currently lives in [place].  I have spent atleast 20 minutes non face to face with patient today. More than 50 % of the time was spent for ordering medications and test ,psychoeducation and supportive psychotherapy and care coordination,as well as documenting clinical information in electronic health record. This note was generated in part or whole with voice recognition software. Voice recognition is usually quite accurate but there are transcription errors that can and very often do occur. I apologize for any typographical errors that were not detected and corrected.         Ursula Alert, MD 01/23/2019, 5:02 PM

## 2019-06-17 ENCOUNTER — Other Ambulatory Visit: Payer: Self-pay | Admitting: Family Medicine

## 2019-06-17 DIAGNOSIS — J069 Acute upper respiratory infection, unspecified: Secondary | ICD-10-CM

## 2019-06-18 NOTE — Telephone Encounter (Signed)
Will forward to PCP
